# Patient Record
Sex: Female | Born: 1980 | Race: Black or African American | Hispanic: No | Marital: Single | Smoking: Former smoker
Health system: Southern US, Community
[De-identification: ages and names within clinical notes are randomized; demographics above are authoritative.]

## PROBLEM LIST (undated history)

## (undated) DIAGNOSIS — I10 Essential (primary) hypertension: Secondary | ICD-10-CM

## (undated) DIAGNOSIS — Z98891 History of uterine scar from previous surgery: Secondary | ICD-10-CM

## (undated) HISTORY — DX: History of uterine scar from previous surgery: Z98.891

---

## 2014-01-20 DIAGNOSIS — J4520 Mild intermittent asthma, uncomplicated: Secondary | ICD-10-CM | POA: Insufficient documentation

## 2017-06-10 DIAGNOSIS — M79602 Pain in left arm: Secondary | ICD-10-CM | POA: Diagnosis not present

## 2017-06-10 DIAGNOSIS — M791 Myalgia, unspecified site: Secondary | ICD-10-CM | POA: Diagnosis not present

## 2017-10-09 DIAGNOSIS — Z309 Encounter for contraceptive management, unspecified: Secondary | ICD-10-CM

## 2017-10-09 DIAGNOSIS — R030 Elevated blood-pressure reading, without diagnosis of hypertension: Secondary | ICD-10-CM | POA: Diagnosis not present

## 2017-10-09 DIAGNOSIS — F411 Generalized anxiety disorder: Secondary | ICD-10-CM | POA: Diagnosis not present

## 2017-10-09 DIAGNOSIS — Z0000 Encounter for general adult medical examination without abnormal findings: Secondary | ICD-10-CM | POA: Diagnosis not present

## 2017-10-09 DIAGNOSIS — Z13220 Encounter for screening for lipoid disorders: Secondary | ICD-10-CM | POA: Diagnosis not present

## 2017-10-09 DIAGNOSIS — Z113 Encounter for screening for infections with a predominantly sexual mode of transmission: Secondary | ICD-10-CM | POA: Diagnosis not present

## 2017-10-09 DIAGNOSIS — Z131 Encounter for screening for diabetes mellitus: Secondary | ICD-10-CM | POA: Diagnosis not present

## 2017-10-09 HISTORY — DX: Encounter for contraceptive management, unspecified: Z30.9

## 2017-10-10 DIAGNOSIS — Z309 Encounter for contraceptive management, unspecified: Secondary | ICD-10-CM | POA: Diagnosis not present

## 2017-11-25 DIAGNOSIS — H5213 Myopia, bilateral: Secondary | ICD-10-CM | POA: Diagnosis not present

## 2017-12-03 DIAGNOSIS — H5213 Myopia, bilateral: Secondary | ICD-10-CM | POA: Diagnosis not present

## 2017-12-16 DIAGNOSIS — H52223 Regular astigmatism, bilateral: Secondary | ICD-10-CM | POA: Diagnosis not present

## 2017-12-25 DIAGNOSIS — B349 Viral infection, unspecified: Secondary | ICD-10-CM | POA: Diagnosis not present

## 2017-12-29 DIAGNOSIS — Z309 Encounter for contraceptive management, unspecified: Secondary | ICD-10-CM | POA: Diagnosis not present

## 2017-12-29 DIAGNOSIS — Z23 Encounter for immunization: Secondary | ICD-10-CM | POA: Diagnosis not present

## 2018-01-19 DIAGNOSIS — I671 Cerebral aneurysm, nonruptured: Secondary | ICD-10-CM | POA: Diagnosis not present

## 2018-01-19 DIAGNOSIS — Z0389 Encounter for observation for other suspected diseases and conditions ruled out: Secondary | ICD-10-CM | POA: Diagnosis not present

## 2018-01-19 DIAGNOSIS — R200 Anesthesia of skin: Secondary | ICD-10-CM | POA: Diagnosis not present

## 2018-01-19 DIAGNOSIS — R55 Syncope and collapse: Secondary | ICD-10-CM | POA: Diagnosis not present

## 2018-01-19 DIAGNOSIS — I10 Essential (primary) hypertension: Secondary | ICD-10-CM | POA: Diagnosis not present

## 2018-01-19 DIAGNOSIS — M25512 Pain in left shoulder: Secondary | ICD-10-CM | POA: Diagnosis not present

## 2018-01-19 DIAGNOSIS — R531 Weakness: Secondary | ICD-10-CM | POA: Diagnosis not present

## 2018-01-20 DIAGNOSIS — I671 Cerebral aneurysm, nonruptured: Secondary | ICD-10-CM | POA: Diagnosis not present

## 2018-01-22 DIAGNOSIS — Z09 Encounter for follow-up examination after completed treatment for conditions other than malignant neoplasm: Secondary | ICD-10-CM | POA: Diagnosis not present

## 2018-01-22 DIAGNOSIS — I10 Essential (primary) hypertension: Secondary | ICD-10-CM | POA: Diagnosis not present

## 2018-01-22 DIAGNOSIS — I671 Cerebral aneurysm, nonruptured: Secondary | ICD-10-CM | POA: Diagnosis not present

## 2018-01-22 DIAGNOSIS — G43909 Migraine, unspecified, not intractable, without status migrainosus: Secondary | ICD-10-CM | POA: Diagnosis not present

## 2018-02-05 DIAGNOSIS — G43909 Migraine, unspecified, not intractable, without status migrainosus: Secondary | ICD-10-CM | POA: Diagnosis not present

## 2018-02-05 DIAGNOSIS — I10 Essential (primary) hypertension: Secondary | ICD-10-CM | POA: Diagnosis not present

## 2018-02-11 DIAGNOSIS — I671 Cerebral aneurysm, nonruptured: Secondary | ICD-10-CM | POA: Diagnosis not present

## 2018-03-31 DIAGNOSIS — Z309 Encounter for contraceptive management, unspecified: Secondary | ICD-10-CM | POA: Diagnosis not present

## 2018-05-15 DIAGNOSIS — E669 Obesity, unspecified: Secondary | ICD-10-CM | POA: Diagnosis not present

## 2018-05-15 DIAGNOSIS — I10 Essential (primary) hypertension: Secondary | ICD-10-CM | POA: Diagnosis not present

## 2018-06-19 DIAGNOSIS — Z309 Encounter for contraceptive management, unspecified: Secondary | ICD-10-CM | POA: Diagnosis not present

## 2018-08-12 DIAGNOSIS — I671 Cerebral aneurysm, nonruptured: Secondary | ICD-10-CM | POA: Diagnosis not present

## 2018-08-12 DIAGNOSIS — Z6831 Body mass index (BMI) 31.0-31.9, adult: Secondary | ICD-10-CM | POA: Diagnosis not present

## 2018-10-30 DIAGNOSIS — K5900 Constipation, unspecified: Secondary | ICD-10-CM | POA: Diagnosis not present

## 2018-10-30 DIAGNOSIS — G43009 Migraine without aura, not intractable, without status migrainosus: Secondary | ICD-10-CM | POA: Diagnosis not present

## 2018-10-30 DIAGNOSIS — I10 Essential (primary) hypertension: Secondary | ICD-10-CM | POA: Diagnosis not present

## 2018-10-30 DIAGNOSIS — Z13220 Encounter for screening for lipoid disorders: Secondary | ICD-10-CM | POA: Diagnosis not present

## 2018-10-30 DIAGNOSIS — E669 Obesity, unspecified: Secondary | ICD-10-CM | POA: Diagnosis not present

## 2018-11-14 DIAGNOSIS — Z03818 Encounter for observation for suspected exposure to other biological agents ruled out: Secondary | ICD-10-CM | POA: Diagnosis not present

## 2018-12-28 DIAGNOSIS — Z30019 Encounter for initial prescription of contraceptives, unspecified: Secondary | ICD-10-CM | POA: Diagnosis not present

## 2018-12-28 DIAGNOSIS — Z113 Encounter for screening for infections with a predominantly sexual mode of transmission: Secondary | ICD-10-CM | POA: Diagnosis not present

## 2018-12-28 DIAGNOSIS — Z0000 Encounter for general adult medical examination without abnormal findings: Secondary | ICD-10-CM | POA: Diagnosis not present

## 2018-12-28 DIAGNOSIS — Z124 Encounter for screening for malignant neoplasm of cervix: Secondary | ICD-10-CM | POA: Diagnosis not present

## 2019-02-16 DIAGNOSIS — I729 Aneurysm of unspecified site: Secondary | ICD-10-CM | POA: Diagnosis not present

## 2019-02-16 DIAGNOSIS — G43009 Migraine without aura, not intractable, without status migrainosus: Secondary | ICD-10-CM | POA: Diagnosis not present

## 2019-02-16 DIAGNOSIS — G43001 Migraine without aura, not intractable, with status migrainosus: Secondary | ICD-10-CM | POA: Diagnosis not present

## 2019-02-16 DIAGNOSIS — I10 Essential (primary) hypertension: Secondary | ICD-10-CM | POA: Diagnosis not present

## 2019-03-04 DIAGNOSIS — Z20828 Contact with and (suspected) exposure to other viral communicable diseases: Secondary | ICD-10-CM | POA: Diagnosis not present

## 2019-04-01 DIAGNOSIS — G43909 Migraine, unspecified, not intractable, without status migrainosus: Secondary | ICD-10-CM | POA: Diagnosis not present

## 2019-04-01 DIAGNOSIS — R519 Headache, unspecified: Secondary | ICD-10-CM | POA: Diagnosis not present

## 2019-04-01 DIAGNOSIS — Z87891 Personal history of nicotine dependence: Secondary | ICD-10-CM | POA: Diagnosis not present

## 2019-04-09 DIAGNOSIS — G43001 Migraine without aura, not intractable, with status migrainosus: Secondary | ICD-10-CM | POA: Insufficient documentation

## 2019-04-09 DIAGNOSIS — I671 Cerebral aneurysm, nonruptured: Secondary | ICD-10-CM | POA: Insufficient documentation

## 2019-04-09 DIAGNOSIS — Z87898 Personal history of other specified conditions: Secondary | ICD-10-CM | POA: Insufficient documentation

## 2019-04-09 DIAGNOSIS — Z87891 Personal history of nicotine dependence: Secondary | ICD-10-CM | POA: Diagnosis not present

## 2019-04-09 DIAGNOSIS — R7303 Prediabetes: Secondary | ICD-10-CM | POA: Diagnosis not present

## 2019-04-09 DIAGNOSIS — G43009 Migraine without aura, not intractable, without status migrainosus: Secondary | ICD-10-CM | POA: Diagnosis not present

## 2019-04-09 DIAGNOSIS — Z8679 Personal history of other diseases of the circulatory system: Secondary | ICD-10-CM | POA: Diagnosis not present

## 2019-04-24 DIAGNOSIS — I671 Cerebral aneurysm, nonruptured: Secondary | ICD-10-CM | POA: Diagnosis not present

## 2019-04-24 DIAGNOSIS — I729 Aneurysm of unspecified site: Secondary | ICD-10-CM | POA: Diagnosis not present

## 2019-05-15 DIAGNOSIS — I773 Arterial fibromuscular dysplasia: Secondary | ICD-10-CM | POA: Diagnosis not present

## 2019-05-21 DIAGNOSIS — I671 Cerebral aneurysm, nonruptured: Secondary | ICD-10-CM | POA: Diagnosis not present

## 2019-08-20 DIAGNOSIS — Z3042 Encounter for surveillance of injectable contraceptive: Secondary | ICD-10-CM | POA: Diagnosis not present

## 2019-08-20 DIAGNOSIS — Z23 Encounter for immunization: Secondary | ICD-10-CM | POA: Diagnosis not present

## 2019-09-10 DIAGNOSIS — I773 Arterial fibromuscular dysplasia: Secondary | ICD-10-CM | POA: Diagnosis not present

## 2019-09-10 DIAGNOSIS — Z8679 Personal history of other diseases of the circulatory system: Secondary | ICD-10-CM | POA: Diagnosis not present

## 2019-09-29 DIAGNOSIS — Z5329 Procedure and treatment not carried out because of patient's decision for other reasons: Secondary | ICD-10-CM | POA: Diagnosis not present

## 2019-09-29 DIAGNOSIS — R202 Paresthesia of skin: Secondary | ICD-10-CM | POA: Diagnosis not present

## 2019-09-29 DIAGNOSIS — R519 Headache, unspecified: Secondary | ICD-10-CM | POA: Diagnosis not present

## 2019-10-31 DIAGNOSIS — Z87891 Personal history of nicotine dependence: Secondary | ICD-10-CM | POA: Diagnosis not present

## 2019-10-31 DIAGNOSIS — R05 Cough: Secondary | ICD-10-CM | POA: Diagnosis not present

## 2019-10-31 DIAGNOSIS — R519 Headache, unspecified: Secondary | ICD-10-CM | POA: Diagnosis not present

## 2019-10-31 DIAGNOSIS — Z20822 Contact with and (suspected) exposure to covid-19: Secondary | ICD-10-CM | POA: Diagnosis not present

## 2019-11-03 DIAGNOSIS — Z20828 Contact with and (suspected) exposure to other viral communicable diseases: Secondary | ICD-10-CM | POA: Diagnosis not present

## 2019-11-05 DIAGNOSIS — Z3042 Encounter for surveillance of injectable contraceptive: Secondary | ICD-10-CM | POA: Diagnosis not present

## 2020-01-17 DIAGNOSIS — H5213 Myopia, bilateral: Secondary | ICD-10-CM | POA: Diagnosis not present

## 2020-01-25 DIAGNOSIS — Z3042 Encounter for surveillance of injectable contraceptive: Secondary | ICD-10-CM | POA: Diagnosis not present

## 2020-03-08 DIAGNOSIS — Z20828 Contact with and (suspected) exposure to other viral communicable diseases: Secondary | ICD-10-CM | POA: Diagnosis not present

## 2020-03-08 DIAGNOSIS — Z1152 Encounter for screening for COVID-19: Secondary | ICD-10-CM | POA: Diagnosis not present

## 2020-03-09 DIAGNOSIS — Z20828 Contact with and (suspected) exposure to other viral communicable diseases: Secondary | ICD-10-CM | POA: Diagnosis not present

## 2020-04-19 DIAGNOSIS — Z3009 Encounter for other general counseling and advice on contraception: Secondary | ICD-10-CM | POA: Diagnosis not present

## 2020-05-01 DIAGNOSIS — I10 Essential (primary) hypertension: Secondary | ICD-10-CM | POA: Diagnosis not present

## 2020-05-01 DIAGNOSIS — N898 Other specified noninflammatory disorders of vagina: Secondary | ICD-10-CM | POA: Diagnosis not present

## 2020-05-01 DIAGNOSIS — Z3042 Encounter for surveillance of injectable contraceptive: Secondary | ICD-10-CM | POA: Diagnosis not present

## 2020-05-01 DIAGNOSIS — Z113 Encounter for screening for infections with a predominantly sexual mode of transmission: Secondary | ICD-10-CM | POA: Diagnosis not present

## 2020-05-01 DIAGNOSIS — L509 Urticaria, unspecified: Secondary | ICD-10-CM | POA: Diagnosis not present

## 2020-05-24 DIAGNOSIS — R3129 Other microscopic hematuria: Secondary | ICD-10-CM | POA: Insufficient documentation

## 2020-05-24 DIAGNOSIS — E6609 Other obesity due to excess calories: Secondary | ICD-10-CM | POA: Insufficient documentation

## 2020-05-24 DIAGNOSIS — A749 Chlamydial infection, unspecified: Secondary | ICD-10-CM | POA: Diagnosis not present

## 2020-05-24 DIAGNOSIS — M791 Myalgia, unspecified site: Secondary | ICD-10-CM | POA: Diagnosis not present

## 2020-05-24 DIAGNOSIS — Z87891 Personal history of nicotine dependence: Secondary | ICD-10-CM | POA: Diagnosis not present

## 2020-05-24 DIAGNOSIS — Z6835 Body mass index (BMI) 35.0-35.9, adult: Secondary | ICD-10-CM | POA: Diagnosis not present

## 2020-05-24 DIAGNOSIS — Z8679 Personal history of other diseases of the circulatory system: Secondary | ICD-10-CM | POA: Diagnosis not present

## 2020-05-24 DIAGNOSIS — J4520 Mild intermittent asthma, uncomplicated: Secondary | ICD-10-CM | POA: Diagnosis not present

## 2020-05-24 DIAGNOSIS — Z87898 Personal history of other specified conditions: Secondary | ICD-10-CM | POA: Diagnosis not present

## 2020-05-24 DIAGNOSIS — Z113 Encounter for screening for infections with a predominantly sexual mode of transmission: Secondary | ICD-10-CM | POA: Diagnosis not present

## 2020-05-24 DIAGNOSIS — H6123 Impacted cerumen, bilateral: Secondary | ICD-10-CM | POA: Diagnosis not present

## 2020-05-24 HISTORY — DX: Other microscopic hematuria: R31.29

## 2020-06-19 DIAGNOSIS — R3129 Other microscopic hematuria: Secondary | ICD-10-CM | POA: Diagnosis not present

## 2020-06-29 DIAGNOSIS — I671 Cerebral aneurysm, nonruptured: Secondary | ICD-10-CM | POA: Diagnosis not present

## 2020-07-04 DIAGNOSIS — I773 Arterial fibromuscular dysplasia: Secondary | ICD-10-CM | POA: Diagnosis not present

## 2020-07-04 DIAGNOSIS — R42 Dizziness and giddiness: Secondary | ICD-10-CM | POA: Diagnosis not present

## 2020-07-04 DIAGNOSIS — I10 Essential (primary) hypertension: Secondary | ICD-10-CM | POA: Diagnosis not present

## 2020-07-04 DIAGNOSIS — J45909 Unspecified asthma, uncomplicated: Secondary | ICD-10-CM | POA: Diagnosis not present

## 2020-07-04 DIAGNOSIS — R200 Anesthesia of skin: Secondary | ICD-10-CM | POA: Diagnosis not present

## 2020-07-04 DIAGNOSIS — R4189 Other symptoms and signs involving cognitive functions and awareness: Secondary | ICD-10-CM | POA: Diagnosis not present

## 2020-07-04 DIAGNOSIS — M542 Cervicalgia: Secondary | ICD-10-CM | POA: Diagnosis not present

## 2020-07-04 DIAGNOSIS — R4589 Other symptoms and signs involving emotional state: Secondary | ICD-10-CM | POA: Diagnosis not present

## 2020-07-04 DIAGNOSIS — Z87891 Personal history of nicotine dependence: Secondary | ICD-10-CM | POA: Diagnosis not present

## 2020-07-04 DIAGNOSIS — E785 Hyperlipidemia, unspecified: Secondary | ICD-10-CM | POA: Diagnosis not present

## 2020-07-04 DIAGNOSIS — I671 Cerebral aneurysm, nonruptured: Secondary | ICD-10-CM | POA: Diagnosis not present

## 2020-07-04 DIAGNOSIS — G43009 Migraine without aura, not intractable, without status migrainosus: Secondary | ICD-10-CM | POA: Diagnosis not present

## 2020-07-04 DIAGNOSIS — G479 Sleep disorder, unspecified: Secondary | ICD-10-CM | POA: Diagnosis not present

## 2020-07-10 DIAGNOSIS — Z3042 Encounter for surveillance of injectable contraceptive: Secondary | ICD-10-CM | POA: Diagnosis not present

## 2020-07-10 DIAGNOSIS — R3129 Other microscopic hematuria: Secondary | ICD-10-CM | POA: Diagnosis not present

## 2020-07-21 DIAGNOSIS — E559 Vitamin D deficiency, unspecified: Secondary | ICD-10-CM | POA: Insufficient documentation

## 2020-07-21 DIAGNOSIS — E782 Mixed hyperlipidemia: Secondary | ICD-10-CM | POA: Insufficient documentation

## 2020-07-21 HISTORY — DX: Vitamin D deficiency, unspecified: E55.9

## 2020-08-05 DIAGNOSIS — R200 Anesthesia of skin: Secondary | ICD-10-CM | POA: Diagnosis not present

## 2020-08-05 DIAGNOSIS — M47812 Spondylosis without myelopathy or radiculopathy, cervical region: Secondary | ICD-10-CM | POA: Diagnosis not present

## 2020-08-05 DIAGNOSIS — M542 Cervicalgia: Secondary | ICD-10-CM | POA: Diagnosis not present

## 2020-08-05 DIAGNOSIS — I773 Arterial fibromuscular dysplasia: Secondary | ICD-10-CM | POA: Diagnosis not present

## 2020-08-05 DIAGNOSIS — R202 Paresthesia of skin: Secondary | ICD-10-CM | POA: Diagnosis not present

## 2020-09-14 DIAGNOSIS — I773 Arterial fibromuscular dysplasia: Secondary | ICD-10-CM | POA: Insufficient documentation

## 2020-09-14 DIAGNOSIS — E782 Mixed hyperlipidemia: Secondary | ICD-10-CM | POA: Diagnosis not present

## 2020-09-14 DIAGNOSIS — E559 Vitamin D deficiency, unspecified: Secondary | ICD-10-CM | POA: Diagnosis not present

## 2020-09-14 DIAGNOSIS — J4520 Mild intermittent asthma, uncomplicated: Secondary | ICD-10-CM | POA: Diagnosis not present

## 2020-09-14 DIAGNOSIS — G43001 Migraine without aura, not intractable, with status migrainosus: Secondary | ICD-10-CM | POA: Diagnosis not present

## 2020-09-14 DIAGNOSIS — Z3042 Encounter for surveillance of injectable contraceptive: Secondary | ICD-10-CM | POA: Diagnosis not present

## 2020-09-14 DIAGNOSIS — E6609 Other obesity due to excess calories: Secondary | ICD-10-CM | POA: Diagnosis not present

## 2020-09-14 DIAGNOSIS — I671 Cerebral aneurysm, nonruptured: Secondary | ICD-10-CM | POA: Diagnosis not present

## 2020-09-14 DIAGNOSIS — I10 Essential (primary) hypertension: Secondary | ICD-10-CM | POA: Diagnosis not present

## 2020-09-14 DIAGNOSIS — Z6834 Body mass index (BMI) 34.0-34.9, adult: Secondary | ICD-10-CM | POA: Diagnosis not present

## 2020-10-09 DIAGNOSIS — Z3042 Encounter for surveillance of injectable contraceptive: Secondary | ICD-10-CM | POA: Diagnosis not present

## 2020-10-18 DIAGNOSIS — G4720 Circadian rhythm sleep disorder, unspecified type: Secondary | ICD-10-CM | POA: Diagnosis not present

## 2020-10-18 DIAGNOSIS — R0683 Snoring: Secondary | ICD-10-CM | POA: Diagnosis not present

## 2020-10-18 DIAGNOSIS — F518 Other sleep disorders not due to a substance or known physiological condition: Secondary | ICD-10-CM | POA: Diagnosis not present

## 2020-10-18 DIAGNOSIS — G4750 Parasomnia, unspecified: Secondary | ICD-10-CM | POA: Diagnosis not present

## 2020-10-19 DIAGNOSIS — M5030 Other cervical disc degeneration, unspecified cervical region: Secondary | ICD-10-CM | POA: Diagnosis not present

## 2020-10-19 DIAGNOSIS — M47812 Spondylosis without myelopathy or radiculopathy, cervical region: Secondary | ICD-10-CM | POA: Diagnosis not present

## 2020-10-19 DIAGNOSIS — M40202 Unspecified kyphosis, cervical region: Secondary | ICD-10-CM | POA: Diagnosis not present

## 2020-10-30 ENCOUNTER — Ambulatory Visit: Payer: Self-pay | Admitting: Physician Assistant

## 2020-11-15 DIAGNOSIS — F518 Other sleep disorders not due to a substance or known physiological condition: Secondary | ICD-10-CM | POA: Diagnosis not present

## 2020-11-15 DIAGNOSIS — G4720 Circadian rhythm sleep disorder, unspecified type: Secondary | ICD-10-CM | POA: Diagnosis not present

## 2020-11-15 DIAGNOSIS — G4750 Parasomnia, unspecified: Secondary | ICD-10-CM | POA: Diagnosis not present

## 2020-12-12 DIAGNOSIS — G4733 Obstructive sleep apnea (adult) (pediatric): Secondary | ICD-10-CM | POA: Diagnosis not present

## 2020-12-13 DIAGNOSIS — G8929 Other chronic pain: Secondary | ICD-10-CM | POA: Diagnosis not present

## 2020-12-13 DIAGNOSIS — M47812 Spondylosis without myelopathy or radiculopathy, cervical region: Secondary | ICD-10-CM | POA: Diagnosis not present

## 2020-12-13 DIAGNOSIS — M7918 Myalgia, other site: Secondary | ICD-10-CM | POA: Diagnosis not present

## 2020-12-21 DIAGNOSIS — G4733 Obstructive sleep apnea (adult) (pediatric): Secondary | ICD-10-CM | POA: Insufficient documentation

## 2020-12-22 ENCOUNTER — Other Ambulatory Visit (HOSPITAL_COMMUNITY): Payer: Self-pay

## 2020-12-22 ENCOUNTER — Encounter: Payer: Self-pay | Admitting: Family Medicine

## 2020-12-22 ENCOUNTER — Other Ambulatory Visit: Payer: Self-pay

## 2020-12-22 ENCOUNTER — Ambulatory Visit: Payer: Medicaid Other | Admitting: Family Medicine

## 2020-12-22 VITALS — BP 135/88 | HR 90 | Ht 60.0 in | Wt 181.0 lb

## 2020-12-22 DIAGNOSIS — R4589 Other symptoms and signs involving emotional state: Secondary | ICD-10-CM

## 2020-12-22 DIAGNOSIS — R140 Abdominal distension (gaseous): Secondary | ICD-10-CM

## 2020-12-22 DIAGNOSIS — I671 Cerebral aneurysm, nonruptured: Secondary | ICD-10-CM

## 2020-12-22 DIAGNOSIS — U071 COVID-19: Secondary | ICD-10-CM | POA: Insufficient documentation

## 2020-12-22 DIAGNOSIS — Z1231 Encounter for screening mammogram for malignant neoplasm of breast: Secondary | ICD-10-CM

## 2020-12-22 DIAGNOSIS — R040 Epistaxis: Secondary | ICD-10-CM | POA: Insufficient documentation

## 2020-12-22 DIAGNOSIS — G4733 Obstructive sleep apnea (adult) (pediatric): Secondary | ICD-10-CM | POA: Diagnosis not present

## 2020-12-22 DIAGNOSIS — F418 Other specified anxiety disorders: Secondary | ICD-10-CM

## 2020-12-22 DIAGNOSIS — K5900 Constipation, unspecified: Secondary | ICD-10-CM

## 2020-12-22 DIAGNOSIS — Z0000 Encounter for general adult medical examination without abnormal findings: Secondary | ICD-10-CM | POA: Diagnosis present

## 2020-12-22 DIAGNOSIS — Z23 Encounter for immunization: Secondary | ICD-10-CM

## 2020-12-22 DIAGNOSIS — G4700 Insomnia, unspecified: Secondary | ICD-10-CM

## 2020-12-22 DIAGNOSIS — R3129 Other microscopic hematuria: Secondary | ICD-10-CM | POA: Diagnosis not present

## 2020-12-22 DIAGNOSIS — I10 Essential (primary) hypertension: Secondary | ICD-10-CM

## 2020-12-22 DIAGNOSIS — Z3042 Encounter for surveillance of injectable contraceptive: Secondary | ICD-10-CM

## 2020-12-22 DIAGNOSIS — Z1211 Encounter for screening for malignant neoplasm of colon: Secondary | ICD-10-CM

## 2020-12-22 DIAGNOSIS — E6609 Other obesity due to excess calories: Secondary | ICD-10-CM | POA: Insufficient documentation

## 2020-12-22 DIAGNOSIS — Z6836 Body mass index (BMI) 36.0-36.9, adult: Secondary | ICD-10-CM | POA: Insufficient documentation

## 2020-12-22 HISTORY — DX: Epistaxis: R04.0

## 2020-12-22 HISTORY — DX: Encounter for general adult medical examination without abnormal findings: Z00.00

## 2020-12-22 HISTORY — DX: COVID-19: U07.1

## 2020-12-22 HISTORY — DX: Abdominal distension (gaseous): R14.0

## 2020-12-22 MED ORDER — VERAPAMIL HCL ER 120 MG PO CP24: 120.0000 mg | ORAL_CAPSULE | Freq: Every day | ORAL | 3 refills | Status: DC

## 2020-12-22 MED ORDER — LISINOPRIL 10 MG PO TABS: 10.0000 mg | ORAL_TABLET | Freq: Every day | ORAL | 3 refills | Status: DC

## 2020-12-22 MED ORDER — DOCUSATE SODIUM 250 MG PO CAPS: 250.0000 mg | ORAL_CAPSULE | Freq: Every day | ORAL | 0 refills | Status: DC

## 2020-12-22 MED ORDER — NORTRIPTYLINE HCL 25 MG PO CAPS: 25.0000 mg | ORAL_CAPSULE | Freq: Every day | ORAL | 3 refills | Status: DC

## 2020-12-22 NOTE — Progress Notes (Signed)
    SUBJECTIVE:   CHIEF COMPLAINT / HPI:   New patient here to establish care - review PMHx and medications - patient has several concerns, addressed below  Abdominal bloating and urinary frequency - intermittent bloating - sister had similar presentation plus urinary frequency and ended up being diagnosed with some kind of tumor at the age of 40 yo, had to get tumor resection and chemo for it - patient cannot recall what kind of cancer or what abdominal organ it affected - no known other family history of abdominal malignancy, etc - no dysuria - some suprapubic pain - never been to GI doctor before - she is quite concerned, given her sister's presentation and diagnosis  Contraception - next due for Depo injection at end of month - RN Melvenia Beam has made appointment   PERTINENT  PMH / PSH: COVID in June, brain aneurysm followed by [facility], HTN, cervical spondylosis, OSA  OBJECTIVE:   BP 135/88   Pulse 90   Ht 5' (1.524 m)   Wt 181 lb (82.1 kg)   SpO2 100%   BMI 35.35 kg/m    PHQ-9: No flowsheet data found.   GAD-7: No flowsheet data found.  Physical Exam General: Awake, alert, oriented HEENT: PERRL, bilateral TM pearly pink and flat, bilateral external auditory canals with minimal cerumen burden, no lesions, nasal mucosa slightly edematous, oral mucosa pink, moist, without lesion, intact dentition Cardiovascular: Regular rate and rhythm, S1 and S2 present, no murmurs auscultated Respiratory: Lung fields clear to auscultation bilaterally Abdomen: Soft, nondistended, moderate TTP in LLQ, mild TTP in suprapubic area, no rebound tenderness or guarding Neuro: Cranial nerves II through X grossly intact, equal smile and cheek puff, uvula midline, extraocular movements intact, BLE strength 5/5 and equal bilaterally, BLE strength 5/5 and equal bilaterally  ASSESSMENT/PLAN:   Hypertension in setting of cerebral aneurysm without rupture Sees [facility] for this. Surveillance only, no  procedures or operations. BP today 135/88. Patient requests refills of BP meds. No changes at this time.   Contraception management Currently using Depo-provera. Will be due for next injection in 1-2 weeks. Made a nurse-only appointment for one week to obtain this.   Abdominal bloating Patient reports intermittent abdominal bloating, low suprapubic pain, and discomfort. No history of intestinal diagnoses that she knows of. Chart review reveals microscopic hematuria earlier in 2022 with referral to urology 07/2020. She reports her sister had similar presentation at 73 yo and was diagnosed with abdominal cancer of some sort that required resection and chemo. Unfortunately, patient cannot recall what kind of cancer or what abdominal organ it affected. Will send to GI. Will also refer to urology, as she does not appear to have made the connection May 2022.   Healthcare maintenance Ordered mammogram.  Received flu and TDaP vaccines, tolerated well.  Due for PAP, needs to schedule at her convenience.      Fayette Pho, MD [facility]

## 2020-12-22 NOTE — Assessment & Plan Note (Signed)
Patient reports anxiety worse at night. Says she is scared to go to sleep because she is afraid she won't wake up. Has not tried therapy before to work through this anxiety. Given other complaints addressed today, will work on this at follow up in one month.

## 2020-12-22 NOTE — Assessment & Plan Note (Signed)
Recently had sleep study in [facility] system. She has future appointment in one week to review results. No CPAP at this time.

## 2020-12-22 NOTE — Assessment & Plan Note (Addendum)
Ordered mammogram.  Received flu and TDaP vaccines, tolerated well.  Due for PAP, needs to schedule at her convenience.

## 2020-12-22 NOTE — Assessment & Plan Note (Signed)
Sees [facility] for this. Surveillance only, no procedures or operations.

## 2020-12-22 NOTE — Assessment & Plan Note (Signed)
Currently using Depo-provera. Will be due for next injection in 1-2 weeks. Made a nurse-only appointment for one week to obtain this.

## 2020-12-22 NOTE — Patient Instructions (Signed)
It was wonderful to meet you today. Thank you for allowing me to be a part of your care. Below is a short summary of what we discussed at your visit today:  High blood pressure Today we sent refills of your BP meds to the pharmacy. Please make an appointment to follow up in 3 weeks.   Mammogram I sent an order for you to get your mammogram. [facility] will go to the [facility]. Call them directly for an appointment.   Abdominal bloating and pain I have referred you to a gastroenterology office. They may do a colonoscopy. Their office should call you directly to schedule your appointment.   Vaccines Today you received the annual flu vaccine and the Tdap vaccine.  You may experience some residual soreness at the injection site.  Gentle stretches and regular use of that arm will help speed up your recovery.  As the vaccines are giving your immune system a "practice run" against specific infections, you may feel a little under the weather for the next several days.  We recommend rest as needed and hydrating.  Depo for birth control Come back in one week for your injection appointment.    Please bring all of your medications to every appointment!  If you have any questions or concerns, please do not hesitate to contact us via phone or MyChart message.   Fayette Pho, MD

## 2020-12-22 NOTE — Assessment & Plan Note (Signed)
Still reports anosmia and dysgeusia, wonders if this is normal this far out from infection. Also reports she thinks she occasionally smells something burning when her son cannot smell it. Discussed with patient. No neuro deficits, physical exam unremarkable - see neuro exam. Discussed long term sequela of COVID infection.

## 2020-12-22 NOTE — Assessment & Plan Note (Signed)
BP today 135/88. Patient requests refills of BP meds. No changes at this time.

## 2020-12-22 NOTE — Assessment & Plan Note (Addendum)
Patient reports intermittent abdominal bloating, low suprapubic pain, and discomfort. No history of intestinal diagnoses that she knows of. Chart review reveals microscopic hematuria earlier in 2022 with referral to urology 07/2020. She reports her sister had similar presentation at 40 yo and was diagnosed with abdominal cancer of some sort that required resection and chemo. Unfortunately, patient cannot recall what kind of cancer or what abdominal organ it affected. Will send to GI. Will also refer to urology, as she does not appear to have made the connection May 2022.

## 2020-12-27 DIAGNOSIS — G4710 Hypersomnia, unspecified: Secondary | ICD-10-CM | POA: Diagnosis not present

## 2020-12-28 ENCOUNTER — Ambulatory Visit (INDEPENDENT_AMBULATORY_CARE_PROVIDER_SITE_OTHER): Payer: Medicaid Other

## 2020-12-28 ENCOUNTER — Other Ambulatory Visit: Payer: Self-pay

## 2020-12-28 DIAGNOSIS — Z3042 Encounter for surveillance of injectable contraceptive: Secondary | ICD-10-CM

## 2021-01-01 MED ORDER — MEDROXYPROGESTERONE ACETATE 150 MG/ML IM SUSP
150.0000 mg | Freq: Once | INTRAMUSCULAR | Status: AC
  Administered 2020-12-28: 150 mg via INTRAMUSCULAR

## 2021-01-01 NOTE — Progress Notes (Signed)
Patient here today for Depo Provera injection and is within her dates. Patient was last given depo injection at [facility]. Verified that patient is within her dates via Epic.   Last contraceptive appt was 12/22/2020  Depo given in [place] today.  Site unremarkable & patient tolerated injection.    Next injection due 03/15/21 -03/29/21.  Reminder card given.    Veronda Prude, RN

## 2021-01-02 DIAGNOSIS — M542 Cervicalgia: Secondary | ICD-10-CM | POA: Diagnosis not present

## 2021-01-09 DIAGNOSIS — R519 Headache, unspecified: Secondary | ICD-10-CM | POA: Diagnosis not present

## 2021-01-09 DIAGNOSIS — E785 Hyperlipidemia, unspecified: Secondary | ICD-10-CM | POA: Diagnosis not present

## 2021-01-10 ENCOUNTER — Telehealth: Payer: Self-pay

## 2021-01-10 DIAGNOSIS — I10 Essential (primary) hypertension: Secondary | ICD-10-CM

## 2021-01-10 NOTE — Telephone Encounter (Signed)
Received fax from pharmacy that Verapamil ER capsules are not covered by insurance.   Per formulary, Verapamil tablets/ ER tablets are covered by insurance. Please advise if medication can be changed from capsule to tablet. If so, please send in new rx to patient's pharmacy.   Key- [id number]  Veronda Prude, RN

## 2021-01-11 MED ORDER — VERAPAMIL HCL ER 120 MG PO TBCR: 120.0000 mg | EXTENDED_RELEASE_TABLET | Freq: Every day | ORAL | 2 refills | Status: DC

## 2021-01-11 NOTE — Telephone Encounter (Signed)
Switch from capsule to tablet is acceptable. I have ordered the tablet version, sent to her pharmacy.   Fayette Pho, MD

## 2021-01-15 ENCOUNTER — Ambulatory Visit (INDEPENDENT_AMBULATORY_CARE_PROVIDER_SITE_OTHER): Payer: Medicaid Other | Admitting: Family Medicine

## 2021-01-15 ENCOUNTER — Encounter: Payer: Self-pay | Admitting: Family Medicine

## 2021-01-15 ENCOUNTER — Other Ambulatory Visit: Payer: Self-pay

## 2021-01-15 VITALS — BP 136/65 | HR 99 | Ht 60.0 in | Wt 185.8 lb

## 2021-01-15 DIAGNOSIS — I671 Cerebral aneurysm, nonruptured: Secondary | ICD-10-CM

## 2021-01-15 DIAGNOSIS — R519 Headache, unspecified: Secondary | ICD-10-CM

## 2021-01-15 DIAGNOSIS — R531 Weakness: Secondary | ICD-10-CM | POA: Insufficient documentation

## 2021-01-15 DIAGNOSIS — G8929 Other chronic pain: Secondary | ICD-10-CM

## 2021-01-15 DIAGNOSIS — I10 Essential (primary) hypertension: Secondary | ICD-10-CM | POA: Diagnosis not present

## 2021-01-15 DIAGNOSIS — R45850 Homicidal ideations: Secondary | ICD-10-CM

## 2021-01-15 DIAGNOSIS — F17200 Nicotine dependence, unspecified, uncomplicated: Secondary | ICD-10-CM | POA: Insufficient documentation

## 2021-01-15 NOTE — Patient Instructions (Addendum)
Thank commended today.  I am sorry to hear that she continued to have a headache.  Your blood pressure looks great today but I am concerned about your headache as well as your left-sided numbness and weakness.  I suggest that you go to the ED immediately following this visit for further work-up.  Dr. Salvadore Dom  Psychiatry Resource List (Adults and Children) Most of these providers will take Medicaid. please consult your insurance for a complete and updated list of available providers. When calling to make an appointment have your insurance information available to confirm you are covered.   [facility]:Psychiatry and Counseling [address] [place], [place] [address] [phone]  [facility]  [address] [place], [place] [phone] Crisis [phone]   [facility]:   [facility]: [address]     [phone]   [facility]: [address] [place]. [place],        [phone] [place]: [address] [phone],    [phone] 5 [place]: [phone] [address],                   [phone] Children: [facility] [address]         [phone]  [facility] (virtual only) [phone]   [facility]  (Psychiatry only; Adults /children 12 and over, will take Medicaid)  [address] Laurell Josephs [address], [place], [place] [address]       [phone]   [facility] (Psychiatry & counseling ; adults & children ; will take Medicaid [address]  [place] [address]  Go on-line to complete referral ( [email or web address]://[email or web address]/en/make-[email or web address]-referral 972-516-2012    (Spanish speaking therapists)  [facility]  Psychiatry & counseling; Adults and children;  Call Registration prior to scheduling an appointment [phone] [address]    [place], [place] [address]    [phone]  [facility] (Psychiatry & counseling; adults & children; Medicare no Medicaid)  [address]   [place], [place]  [address]      [phone]    [facility] (up to age 39)  Psychiatry & counseling ,will take Medicaid, must do counseling to receive psychiatry services  [address] [place] [address]        [phone]  [facility] (Psychiatry & counseling; adults & children; will take Medicaid) Will need a referral from provider [address],  [place], [place]  [phone]   [facility] --- Walk-In Mon-Friday 8am-3pm ( will take Medicaid, Psychiatry, Adults & children,  [address], [place], [place]   [phone]   [facility], Walk-in M-F 8am-12pm and 1pm -3pm   (Counseling, Psychiatry, will take Medicaid, adults & children)  [address], [place], [place]  [phone]

## 2021-01-15 NOTE — Progress Notes (Signed)
SUBJECTIVE:   CHIEF COMPLAINT / HPI:   [name] is a 40 yo F who presents for follow up.   Headache Continues to have headache. Has a history of chronic headaches. Endorsing light sensitivity, noise and smell sensitivity. Has associated nausea and left sided numbness and weakness. States she had a recent fall at work due to weakness. She acknowledges she was recently seen by the neurologist and was going to get her head scanned but did not for unknown reasons.   Hypertension - Medications: Lisinopril 10 mg, verapamil 120 mg - Compliance: Yes - Denies any SOB, CP, vision changes, LE edema, medication SEs, or symptoms of hypotension  Endorsing HI- "I just cant do this anymore", "this is going to kill me". She has daily thoughts of wanting to hurt or kill family members. She does not have a plan and does not know how she would do it. Would like to talk to someone about this.   PERTINENT  PMH / PSH: Cerebral aneurysm without rupture, Anxiety surrounding dying  OBJECTIVE:   BP 136/65   Pulse 99   Ht 5' (1.524 m)   Wt 185 lb 12.8 oz (84.3 kg)   SpO2 99%   BMI 36.29 kg/m   Physical Exam Vitals and nursing note reviewed.  Constitutional:      General: She is not in acute distress.    Appearance: She is not ill-appearing, toxic-appearing or diaphoretic.  HENT:     Head: Normocephalic.  Eyes:     Pupils: Pupils are equal, round, and reactive to light.  Cardiovascular:     Rate and Rhythm: Normal rate and regular rhythm.     Heart sounds: Normal heart sounds.  Pulmonary:     Effort: Pulmonary effort is normal.     Breath sounds: Normal breath sounds.  Abdominal:     General: Bowel sounds are normal.     Palpations: Abdomen is soft.  Neurological:     Mental Status: She is alert and oriented to person, place, and time.     Cranial Nerves: No cranial nerve deficit, dysarthria or facial asymmetry.     Sensory: Sensory deficit present.     Motor: Weakness present.     Gait:  Gait normal.     Comments: Left UE abnormal sensation reported by patient. Left LLE 3/5 strength, RLE 4/5 strength  Psychiatric:        Mood and Affect: Mood is anxious.        Speech: Speech normal.        Behavior: Behavior normal.    ASSESSMENT/PLAN:   Cerebral aneurysm without rupture  Chronic intractable headache Neurology visit 11/8 reviewed. Last CT head and neck with 48mm aneurysm off of M1 of right MCA present. Neurology suggested ED visit during their telemedicine visit due to new findings reported by patient. Patient refused. Neurology ordered the below test and patient never went to get them done for unknown reasons. Today discussed with patient concern for potentially new intracranial findings or worsening of those prior. Ordered tests. Needed prior authorization and our coordinator will be in this afternoon. Suggested patient go to ED instead. She stated she would. Plan to follow up peripherally.  - CT HEAD WO CONTRAST ( ); Future - CT ANGIO NECK CODE STROKE; Future - CT VENOGRAM HEAD; Future  HTN BP at goal. Continue current medications.   Homicidal thoughts Mentioned at the very end of visit. When this provider gave AVS and opened the door to leave  exam room patient erupted in tears and stated the above. These are passive thoughts. Due to urgency and concern of new headache features discussed briefly and given resources for therapy and encouraged to start as soon as possible. Could benefit from PCP follow up beyond evaluation of the above.    Christine Jumbo, DO [facility]

## 2021-01-22 ENCOUNTER — Telehealth: Payer: Self-pay

## 2021-01-22 NOTE — Telephone Encounter (Signed)
Received phone call from [facility] regarding updating order for imaging on 11/23.   Order needs to be updated to CT angio head and neck and CT head w/o contrast needs to be canceled.   Spoke with Dr. McDiarmid regarding patient. Gave verbal authorization to update orders.   Veronda Prude, RN

## 2021-01-24 ENCOUNTER — Other Ambulatory Visit: Payer: Self-pay

## 2021-01-24 ENCOUNTER — Ambulatory Visit (HOSPITAL_COMMUNITY)
Admission: RE | Admit: 2021-01-24 | Discharge: 2021-01-24 | Disposition: A | Discharge status: Home / Self Care | Payer: Medicaid Other | Source: Ambulatory Visit | Attending: Family Medicine | Admitting: Family Medicine

## 2021-01-24 ENCOUNTER — Ambulatory Visit (HOSPITAL_COMMUNITY): Payer: Medicaid Other

## 2021-01-24 DIAGNOSIS — I671 Cerebral aneurysm, nonruptured: Secondary | ICD-10-CM | POA: Diagnosis present

## 2021-01-24 DIAGNOSIS — G8929 Other chronic pain: Secondary | ICD-10-CM | POA: Diagnosis present

## 2021-01-24 DIAGNOSIS — R519 Headache, unspecified: Secondary | ICD-10-CM | POA: Diagnosis present

## 2021-01-24 DIAGNOSIS — H539 Unspecified visual disturbance: Secondary | ICD-10-CM | POA: Diagnosis not present

## 2021-01-24 DIAGNOSIS — R55 Syncope and collapse: Secondary | ICD-10-CM | POA: Diagnosis not present

## 2021-01-24 LAB — POCT I-STAT CREATININE: Creatinine, Ser: 0.9 mg/dL (ref 0.44–1.00)

## 2021-01-24 MED ORDER — IOHEXOL 350 MG/ML SOLN
80.0000 mL | Freq: Once | INTRAVENOUS | Status: AC | PRN
  Administered 2021-01-24: 80 mL via INTRAVENOUS

## 2021-01-24 MED ORDER — SODIUM CHLORIDE (PF) 0.9 % IJ SOLN
INTRAMUSCULAR | Status: AC
  Filled 2021-01-24: qty 50

## 2021-01-27 ENCOUNTER — Encounter: Payer: Self-pay | Admitting: Family Medicine

## 2021-01-30 ENCOUNTER — Other Ambulatory Visit: Payer: Self-pay

## 2021-01-30 ENCOUNTER — Ambulatory Visit
Admission: RE | Admit: 2021-01-30 | Discharge: 2021-01-30 | Disposition: A | Discharge status: Home / Self Care | Payer: Medicaid Other | Source: Ambulatory Visit | Attending: Family Medicine | Admitting: Family Medicine

## 2021-01-30 DIAGNOSIS — Z1231 Encounter for screening mammogram for malignant neoplasm of breast: Secondary | ICD-10-CM

## 2021-02-01 ENCOUNTER — Telehealth: Payer: Self-pay | Admitting: Family Medicine

## 2021-02-01 NOTE — Telephone Encounter (Signed)
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 She is doing better and has been in contact with her neurologist. I will attempt to send results to neurologist as well. Encouraged continued follow up as deem necessary by neurologist.   [name], DO 02/01/2021, 8:07 AM PGY-3, [facility]

## 2021-02-02 ENCOUNTER — Other Ambulatory Visit: Payer: Self-pay | Admitting: Family Medicine

## 2021-02-02 DIAGNOSIS — R928 Other abnormal and inconclusive findings on diagnostic imaging of breast: Secondary | ICD-10-CM

## 2021-02-21 ENCOUNTER — Encounter: Payer: Self-pay | Admitting: Neurology

## 2021-03-08 ENCOUNTER — Ambulatory Visit: Payer: Medicaid Other

## 2021-03-08 ENCOUNTER — Ambulatory Visit
Admission: RE | Admit: 2021-03-08 | Discharge: 2021-03-08 | Disposition: A | Discharge status: Home / Self Care | Payer: Medicaid Other | Source: Ambulatory Visit | Attending: Family Medicine | Admitting: Family Medicine

## 2021-03-08 DIAGNOSIS — R928 Other abnormal and inconclusive findings on diagnostic imaging of breast: Secondary | ICD-10-CM

## 2021-03-08 DIAGNOSIS — R922 Inconclusive mammogram: Secondary | ICD-10-CM | POA: Diagnosis not present

## 2021-03-15 ENCOUNTER — Ambulatory Visit: Payer: Medicaid Other

## 2021-03-16 ENCOUNTER — Emergency Department (HOSPITAL_COMMUNITY): Payer: Medicaid Other

## 2021-03-16 ENCOUNTER — Ambulatory Visit (INDEPENDENT_AMBULATORY_CARE_PROVIDER_SITE_OTHER): Payer: Medicaid Other

## 2021-03-16 ENCOUNTER — Encounter (HOSPITAL_COMMUNITY): Payer: Self-pay

## 2021-03-16 ENCOUNTER — Emergency Department (HOSPITAL_COMMUNITY)
Admission: EM | Admit: 2021-03-16 | Discharge: 2021-03-16 | Disposition: A | Discharge status: Home / Self Care | Payer: Medicaid Other | Attending: Emergency Medicine | Admitting: Emergency Medicine

## 2021-03-16 ENCOUNTER — Other Ambulatory Visit: Payer: Self-pay

## 2021-03-16 DIAGNOSIS — Z79899 Other long term (current) drug therapy: Secondary | ICD-10-CM | POA: Diagnosis not present

## 2021-03-16 DIAGNOSIS — Z3049 Encounter for surveillance of other contraceptives: Secondary | ICD-10-CM | POA: Diagnosis present

## 2021-03-16 DIAGNOSIS — I10 Essential (primary) hypertension: Secondary | ICD-10-CM | POA: Diagnosis not present

## 2021-03-16 DIAGNOSIS — W010XXA Fall on same level from slipping, tripping and stumbling without subsequent striking against object, initial encounter: Secondary | ICD-10-CM | POA: Insufficient documentation

## 2021-03-16 DIAGNOSIS — M5412 Radiculopathy, cervical region: Secondary | ICD-10-CM | POA: Insufficient documentation

## 2021-03-16 DIAGNOSIS — Z7982 Long term (current) use of aspirin: Secondary | ICD-10-CM | POA: Diagnosis not present

## 2021-03-16 DIAGNOSIS — M25561 Pain in right knee: Secondary | ICD-10-CM | POA: Diagnosis present

## 2021-03-16 DIAGNOSIS — W19XXXA Unspecified fall, initial encounter: Secondary | ICD-10-CM

## 2021-03-16 DIAGNOSIS — R079 Chest pain, unspecified: Secondary | ICD-10-CM | POA: Diagnosis not present

## 2021-03-16 DIAGNOSIS — R0789 Other chest pain: Secondary | ICD-10-CM | POA: Diagnosis not present

## 2021-03-16 HISTORY — DX: Essential (primary) hypertension: I10

## 2021-03-16 LAB — BASIC METABOLIC PANEL
Anion gap: 8 (ref 5–15)
BUN: 13 mg/dL (ref 6–20)
CO2: 22 mmol/L (ref 22–32)
Calcium: 9.1 mg/dL (ref 8.9–10.3)
Chloride: 106 mmol/L (ref 98–111)
Creatinine, Ser: 0.74 mg/dL (ref 0.44–1.00)
GFR, Estimated: >60 mL/min (ref >60)
Glucose, Bld: 96 mg/dL (ref 70–99)
Potassium: 3.6 mmol/L (ref 3.5–5.1)
Sodium: 136 mmol/L (ref 135–145)

## 2021-03-16 LAB — CBC
HCT: 42.2 % (ref 36.0–46.0)
Hemoglobin: 13.8 g/dL (ref 12.0–15.0)
MCH: 30.7 pg (ref 26.0–34.0)
MCHC: 32.7 g/dL (ref 30.0–36.0)
MCV: 93.8 fL (ref 80.0–100.0)
Platelets: 289 10*3/uL (ref 150–400)
RBC: 4.5 MIL/uL (ref 3.87–5.11)
RDW: 12.6 % (ref 11.5–15.5)
WBC: 5.4 10*3/uL (ref 4.0–10.5)
nRBC: 0 % (ref 0.0–0.2)

## 2021-03-16 LAB — TROPONIN I (HIGH SENSITIVITY): Troponin I (High Sensitivity): <2 ng/L (ref <18)

## 2021-03-16 MED ORDER — KETOROLAC TROMETHAMINE 15 MG/ML IJ SOLN
15.0000 mg | Freq: Once | INTRAMUSCULAR | Status: AC
Start: 2021-03-16 — End: 2021-03-16
  Administered 2021-03-16: 15 mg via INTRAMUSCULAR
  Filled 2021-03-16: qty 1

## 2021-03-16 MED ORDER — MEDROXYPROGESTERONE ACETATE 150 MG/ML IM SUSP
150.0000 mg | Freq: Once | INTRAMUSCULAR | Status: AC
  Administered 2021-03-16: 150 mg via INTRAMUSCULAR

## 2021-03-16 MED ORDER — NAPROXEN 375 MG PO TABS: 375.0000 mg | ORAL_TABLET | Freq: Two times a day (BID) | ORAL | 0 refills | Status: DC

## 2021-03-16 MED ORDER — PREDNISONE 10 MG (21) PO TBPK: ORAL_TABLET | Freq: Every day | ORAL | 0 refills | Status: DC

## 2021-03-16 NOTE — Progress Notes (Signed)
Orthopedic Tech Progress Note Patient Details:  Christine Gordon 09-09-1980 [id number]  Ortho Devices Type of Ortho Device: Knee Sleeve Ortho Device/Splint Location: RLE Ortho Device/Splint Interventions: Application   Post Interventions Patient Tolerated: Well  Bora Broner E Peni Rupard 03/16/2021, 2:00 PM

## 2021-03-16 NOTE — Progress Notes (Signed)
Patient here today for Depo Provera injection and is within her dates.    Last contraceptive appt was 12/22/2020.   Depo given in [place] today. Site unremarkable & patient tolerated injection.     Next injection due 06/01/2021-06/15/2021. Reminder card given.

## 2021-03-16 NOTE — ED Provider Triage Note (Signed)
Emergency Medicine Provider Triage Evaluation Note  Eulamae Greenstein , a 41 y.o. female  was evaluated in triage.  Pt complains of R knee pain, L arm pain and weakness, cp.  Review of Systems  Positive: Cp, L arm pain and weakness, R knee injury Negative: Fever, sob, numbness  Physical Exam  BP (!) 147/107 (BP Location: Left Arm)    Pulse (!) 106    Temp 98.7 F (37.1 C) (Oral)    Resp 18    SpO2 99%  Gen:   Awake, no distress   Resp:  Normal effort  MSK:   Moves extremities without difficulty  Other:    Medical Decision Making  Medically screening exam initiated at 9:29 AM.  Appropriate orders placed.  Chablis Losh was informed that the remainder of the evaluation will be completed by another provider, this initial triage assessment does not replace that evaluation, and the importance of remaining in the ED until their evaluation is complete.  Pt report she fell 2 days ago when her R knee gave out. Endorse pain about R knee.  Also report intermittent midsternal chest discomfort for the past 2-3 weeks.  Report L arm pain and weakness for the past several months.     Fayrene Helper, PA-C 03/16/21 [phone]

## 2021-03-16 NOTE — ED Provider Notes (Addendum)
[facility]-EMERGENCY DEPT Provider Note   CSN: [id number] Arrival date & time: 03/16/21  6144     History  No chief complaint on file.   Christine Gordon is a 41 y.o. female.  HPI  41 year old female with a history of cervical spondylosis with cervical radiculopathy, hypertension, fibromuscular dysplasia, prediabetes, cerebral aneurysm without rupture, who presents to the emergency department today for evaluation of multiple complaints.  Patient's main concern is that she had a fall yesterday.  She states that her knee gave out on her and she felt a popping sound.  She has had right knee pain since then.  She has been able to ambulate but does get sore when she has to use it for an extended period of time.  There are no other reported injuries including no report of head injury or LOC.  Patient is not anticoagulated.  Additionally the patient is concerned about left upper extremity pain.  She states that this is been ongoing for over a year and is associated with neck pain, tingling and a feeling of heaviness in the left upper extremity.  She has been seen by multiple voc titers about this in the past and actually was following with neurosurgery at [facility] about it.  She states that no one can tell her what is wrong with her and she is looking for some answers today.  She denies any specific changes with her symptoms today  The triage note indicates that the patient is complaining of chest pain however she made no mention of this throughout the entirety of my evaluation of her.   Home Medications Prior to Admission medications   Medication Sig Start Date End Date Taking? Authorizing Provider  naproxen (NAPROSYN) 375 MG tablet Take 1 tablet (375 mg total) by mouth 2 (two) times daily. 03/16/21  Yes Marsha Hillman S, PA-C  predniSONE (STERAPRED UNI-PAK 21 TAB) 10 MG (21) TBPK tablet Take by mouth daily. Take 6 tabs by mouth daily  for 2 days, then 5 tabs for 2 days, then 4  tabs for 2 days, then 3 tabs for 2 days, 2 tabs for 2 days, then 1 tab by mouth daily for 2 days 03/16/21  Yes Winn Muehl S, PA-C  albuterol (VENTOLIN HFA) 108 (90 Base) MCG/ACT inhaler Inhale into the lungs every 6 (six) hours as needed.    [provider]  aspirin EC 81 MG tablet Take 81 mg by mouth daily. Swallow whole.    [provider]  Cholecalciferol 125 MCG (5000 UT) TABS Take by mouth. 09/14/20   [provider]  cyclobenzaprine (FLEXERIL) 5 MG tablet Take 5 mg by mouth 3 (three) times daily as needed.    [provider]  docusate sodium (COLACE) 250 MG capsule Take 1 capsule (250 mg total) by mouth daily. 12/22/20   Fayette Pho, MD  lisinopril (ZESTRIL) 10 MG tablet Take 1 tablet (10 mg total) by mouth at bedtime. 12/22/20   Fayette Pho, MD  medroxyPROGESTERone Acetate 150 MG/ML SUSY Inject into the muscle. 10/09/20 01/01/22  [provider]  meloxicam (MOBIC) 15 MG tablet Take 15 mg by mouth daily as needed.    [provider]  nortriptyline (PAMELOR) 25 MG capsule Take 1 capsule (25 mg total) by mouth at bedtime. 12/22/20   Fayette Pho, MD  rosuvastatin (CRESTOR) 10 MG tablet Take 10 mg by mouth daily.    [provider]  senna (SENOKOT) 8.6 MG TABS tablet Take 1 tablet by mouth.  [provider]  Spacer/Aero-Holding Chambers (EASIVENT) inhaler Use as instructed with inhaler. 01/14/14   [provider]  verapamil (CALAN-SR) 120 MG CR tablet Take 1 tablet (120 mg total) by mouth at bedtime. 01/11/21   Fayette Pho, MD      Allergies    Patient has no known allergies.    Review of Systems   Review of Systems  Musculoskeletal:  Positive for neck pain.       Left arm pain, right knee pain  Skin:  Negative for wound.  Neurological:  Positive for weakness and numbness (paresthesias).   Physical Exam Updated Vital Signs BP (!) 152/100    Pulse 79    Temp 98.7 F (37.1 C) (Oral)     Resp 18    Ht 5' (1.524 m)    Wt 81.6 kg    SpO2 100%    BMI 35.15 kg/m  Physical Exam Vitals and nursing note reviewed.  Constitutional:      General: She is not in acute distress.    Appearance: She is well-developed.  HENT:     Head: Normocephalic and atraumatic.  Eyes:     Conjunctiva/sclera: Conjunctivae normal.  Cardiovascular:     Rate and Rhythm: Normal rate and regular rhythm.  Pulmonary:     Effort: Pulmonary effort is normal.  Abdominal:     General: Abdomen is flat.  Musculoskeletal:     Cervical back: Neck supple.     Comments: TTP to the cervical spine, left trapezius muscles (chronic). TTP over the right patella without obvious deformity. Strength intact with varus/valgus stress and with posterior/anterior drawer testing   Skin:    General: Skin is warm and dry.  Neurological:     Mental Status: She is alert.     Comments: Slightly decreased strength to the LUE compared to the right. Exam is also limited by pain. (Pt does report this is chronic). Sensation is intact bilaterally .    ED Results / Procedures / Treatments   Labs (all labs ordered are listed, but only abnormal results are displayed) Labs Reviewed  BASIC METABOLIC PANEL  CBC  TROPONIN I (HIGH SENSITIVITY)  TROPONIN I (HIGH SENSITIVITY)    EKG EKG Interpretation  Date/Time:  Friday March 16 2021 10:04:51 EST Ventricular Rate:  98 PR Interval:  151 QRS Duration: 73 QT Interval:  330 QTC Calculation: 422 R Axis:   33 Text Interpretation: Sinus rhythm Confirmed by Alvester Chou [phone]) on 03/16/2021 11:00:12 AM  Radiology DG Chest 1 View  Result Date: 03/16/2021 CLINICAL DATA:  Intermittent mid chest pain EXAM: CHEST  1 VIEW COMPARISON:  None. FINDINGS: Normal heart size and mediastinal contours. No acute infiltrate or edema. No effusion or pneumothorax. No acute osseous findings. Scoliosis of the thoracic and lumbar spine IMPRESSION: No acute finding. Thoracolumbar scoliosis.  Electronically Signed   By: Tiburcio Pea M.D.   On: 03/16/2021 10:13   DG Knee Complete 4 Views Right  Result Date: 03/16/2021 CLINICAL DATA:  Right knee pain after fall 2 days ago. EXAM: RIGHT KNEE - COMPLETE 4+ VIEW COMPARISON:  None. FINDINGS: No evidence of fracture, dislocation, or joint effusion. No evidence of arthropathy or other focal bone abnormality. Soft tissues are unremarkable. IMPRESSION: Negative. Electronically Signed   By: Lupita Raider M.D.   On: 03/16/2021 10:09    Procedures Procedures   SPLINT APPLICATION Date/Time: 1:37 PM Authorized by: Karrie Meres Consent: Verbal consent obtained. Risks and benefits: risks, benefits and alternatives  were discussed Consent given by: patient Splint applied by: orthopedic technician Location details: right knee Splint type: knee brace Supplies used: hinged knee brace Post-procedure: The splinted body part was neurovascularly unchanged following the procedure. Patient tolerance: Patient tolerated the procedure well with no immediate complications.    Medications Ordered in ED Medications  ketorolac (TORADOL) 15 MG/ML injection 15 mg (has no administration in time range)    ED Course/ Medical Decision Making/ A&P                           Medical Decision Making  41 year old female presenting the emergency department today for evaluation of right knee pain that occurred after a mechanical fall yesterday.  There were no other reported injuries from the fall.  She is not anticoagulated.  She does have some tenderness over the patella without any obvious deformity.  No joint laxity to suggest ligamentous injury.  X-ray was negative for any fracture.  This was personally reviewed and interpreted and I agree with the report.  She was placed in a hinged knee brace for comfort.  I will also give her follow-up with orthopedics for this.  Additionally she is complaining of neck pain and left upper extremity pain.  She is a  well-documented history of cervical spondylosis with cervical radiculopathy in her chart and previously followed with neurosurgery.  Her symptoms today seem chronic in nature and are unchanged from prior.  I do not see any reason to proceed with any further investigation of her complaint given she does not have any new neurologic deficits or any red flag symptoms today that would warrant immediate work-up.  She did just moved to the area and is requesting referral to a neurosurgeon in the area so this was given to her.  I will start her on prednisone and anti-inflammatories to help with her current symptoms.  She is also advised to follow-up with her PCP to get a referral for physical therapy for her cervical radiculopathy.  Additionally there was mention of chest pain in the triage note however patient made no mention of this throughout the entirety of my evaluation, her cardiac work-up was benign and I have extremely low suspicion for ACS or or other acute cardiopulmonary pathology today that would warrant further work-up or investigation.  I have advised to return to the ED for any new or worsening symptoms in the meantime and she voices understanding the plan and reasons to return.  All questions answered.  Patient stable for discharge.         Final Clinical Impression(s) / ED Diagnoses Final diagnoses:  Fall, initial encounter  Acute pain of right knee  Cervical radiculopathy    Rx / DC Orders ED Discharge Orders          Ordered    predniSONE (STERAPRED UNI-PAK 21 TAB) 10 MG (21) TBPK tablet  Daily        03/16/21 1326    naproxen (NAPROSYN) 375 MG tablet  2 times daily        03/16/21 [address]              Maxden Naji S, PA-C 03/16/21 1337    Karrie MeresCouture, Zainah Steven S, PA-C 03/16/21 1341    Terald Sleeperrifan, Matthew J, MD 03/16/21 1554

## 2021-03-16 NOTE — Discharge Instructions (Addendum)
You may alternate taking Tylenol and Naproxen as needed for pain control. You may take Naproxen twice daily as directed on your discharge paperwork and you may take  [phone] mg of Tylenol every 6 hours. Do not exceed 4000 mg of Tylenol daily as this can lead to liver damage. Also, make sure to take Naproxen with meals as it can cause an upset stomach. Do not take other NSAIDs while taking Naproxen such as (Aleve, Ibuprofen, Aspirin, Celebrex, etc) and do not take more than the prescribed dose as this can lead to ulcers and bleeding in your GI tract. You may use warm and cold compresses to help with your symptoms.   Take prednisone as directed  Please follow up with the orthopedic doctor and the back doctor within the next 7-10 days for re-evaluation and further treatment of your symptoms.   Please return to the ER sooner if you have any new or worsening symptoms.

## 2021-03-16 NOTE — ED Triage Notes (Signed)
Patient reports that 2 nights ago she stretched and heard her right knee pop x 2.  Patient also c/o left arm numbness and weakness x 2 years. Patient states that she can only hold her left arm up for a short time.

## 2021-03-17 ENCOUNTER — Telehealth: Payer: Self-pay

## 2021-03-17 NOTE — Telephone Encounter (Signed)
Transition Care Management Follow-up Telephone Call Date of discharge and from where: 03/16/2021-[facility]  How have you been since you were released from the hospital? Patient stated she is doing fine after discharge.  Any questions or concerns? No  Items Reviewed: Did the pt receive and understand the discharge instructions provided? Yes  Medications obtained and verified? Yes  Other? No  Any new allergies since your discharge? No  Dietary orders reviewed? No Do you have support at home? Yes   Home Care and Equipment/Supplies: Were home health services ordered? not applicable If so, what is the name of the agency? N/A  Has the agency set up a time to come to the patient's home? not applicable Were any new equipment or medical supplies ordered?  No What is the name of the medical supply agency? N/A Were you able to get the supplies/equipment? not applicable Do you have any questions related to the use of the equipment or supplies? No  Functional Questionnaire: (I = Independent and D = Dependent) ADLs: I  Bathing/Dressing- I  Meal Prep- I  Eating- I  Maintaining continence- I  Transferring/Ambulation- I  Managing Meds- I  Follow up appointments reviewed:  PCP Hospital f/u appt confirmed? No   Specialist Hospital f/u appt confirmed? No   Are transportation arrangements needed? No  If their condition worsens, is the pt aware to call PCP or go to the Emergency Dept.? Yes Was the patient provided with contact information for the PCP's office or ED? Yes Was to pt encouraged to call back with questions or concerns? Yes

## 2021-04-04 ENCOUNTER — Other Ambulatory Visit: Payer: Self-pay

## 2021-04-04 ENCOUNTER — Encounter: Payer: Self-pay | Admitting: Family Medicine

## 2021-04-04 ENCOUNTER — Ambulatory Visit (INDEPENDENT_AMBULATORY_CARE_PROVIDER_SITE_OTHER): Payer: Medicaid Other | Admitting: Family Medicine

## 2021-04-04 VITALS — BP 139/99 | HR 88 | Ht 60.0 in | Wt 190.2 lb

## 2021-04-04 DIAGNOSIS — M7918 Myalgia, other site: Secondary | ICD-10-CM | POA: Diagnosis not present

## 2021-04-04 DIAGNOSIS — M25561 Pain in right knee: Secondary | ICD-10-CM | POA: Diagnosis present

## 2021-04-04 DIAGNOSIS — M47812 Spondylosis without myelopathy or radiculopathy, cervical region: Secondary | ICD-10-CM | POA: Diagnosis not present

## 2021-04-04 DIAGNOSIS — G8929 Other chronic pain: Secondary | ICD-10-CM | POA: Diagnosis not present

## 2021-04-04 DIAGNOSIS — M25569 Pain in unspecified knee: Secondary | ICD-10-CM | POA: Insufficient documentation

## 2021-04-04 HISTORY — DX: Pain in unspecified knee: M25.569

## 2021-04-04 MED ORDER — CAPSAICIN-MENTHOL-METHYL SAL 0.001-10-20 % EX LOTN: TOPICAL_LOTION | CUTANEOUS | 1 refills | Status: DC

## 2021-04-04 NOTE — Assessment & Plan Note (Signed)
Patient request referral for neurosurgery closer to home in [place].  Order placed today.  Also referred to physical therapy for this and chronic myofascial pain.

## 2021-04-04 NOTE — Assessment & Plan Note (Signed)
Acute.  Experienced a popping sensation followed by the knee "giving out".  Physical exam today is unremarkable.  Patient desires connection with orthopedics, placed referral to orthopedics and physical therapy.  Contraindication to NSAIDs given brain aneurysm, will opt for topicals.  Rx capsaicin cream.

## 2021-04-04 NOTE — Progress Notes (Signed)
SUBJECTIVE:   CHIEF COMPLAINT / HPI:   ED follow up for knee pain and neck + left arm pain  Chart review, ED 03/16/21, provider notes below "41 year old female presenting the emergency department today for evaluation of right knee pain that occurred after a mechanical fall yesterday.  There were no other reported injuries from the fall.  She is not anticoagulated.  She does have some tenderness over the patella without any obvious deformity.  No joint laxity to suggest ligamentous injury.  X-ray was negative for any fracture.  This was personally reviewed and interpreted and I agree with the report.  She was placed in a hinged knee brace for comfort.  I will also give her follow-up with orthopedics for this.  Additionally she is complaining of neck pain and left upper extremity pain.  She is a well-documented history of cervical spondylosis with cervical radiculopathy in her chart and previously followed with neurosurgery.  Her symptoms today seem chronic in nature and are unchanged from prior.  I do not see any reason to proceed with any further investigation of her complaint given she does not have any new neurologic deficits or any red flag symptoms today that would warrant immediate work-up.  She did just moved to the area and is requesting referral to a neurosurgeon in the area so this was given to her.  I will start her on prednisone and anti-inflammatories to help with her current symptoms.  She is also advised to follow-up with her PCP to get a referral for physical therapy for her cervical radiculopathy.  Additionally there was mention of chest pain in the triage note however patient made no mention of this throughout the entirety of my evaluation, her cardiac work-up was benign and I have extremely low suspicion for ACS or or other acute cardiopulmonary pathology today that would warrant further work-up or investigation.  I have advised to return to the ED for any new or worsening symptoms in  the meantime and she voices understanding the plan and reasons to return.  All questions answered.  Patient stable for discharge."  Knee, fall - felt it "pop out", gave out on her before going to ED - brace provided in ED is helping - no falls since the ED - was referred to orthopedics by ED.  Patient tried to call the ortho office, wasn't able to schedule, would like referral today  Neck and arm pain - documented history of cervical spondylosis and cervical radiculopathy -ED provider did not find any red flag symptoms or acute complaints that vary from baseline - previously set up with PT in [place], the patient reports difficult to attend given distance - Patient requests neurosurgery referral to obtain neurosurgery provider closer to [facility], preferably in city limits  PERTINENT  PMH / PSH:  Patient Active Problem List   Diagnosis Date Noted   Knee pain 04/04/2021   Cervical spondylosis 04/04/2021   Chronic myofascial pain 04/04/2021   Chronic complaint of left-sided weakness 01/15/2021   Smoking    Hypertension 12/22/2020   Anxiety about dying 12/22/2020   Healthcare maintenance 12/22/2020   Class 2 obesity due to excess calories with body mass index (BMI) of 36.0 to 36.9 in adult    Obstructive sleep apnea 12/21/2020   Fibromuscular dysplasia ([place]) 09/14/2020   Mixed hyperlipidemia 07/21/2020   Microscopic hematuria 05/24/2020   Cerebral aneurysm without rupture 04/09/2019   History of prediabetes 04/09/2019   Migraine without aura and with status migrainosus, not  intractable 04/09/2019   Contraception management 10/09/2017   Mild intermittent asthma without complication [id number]    OBJECTIVE:   BP (!) 139/99    Pulse 88    Ht 5' (1.524 m)    Wt 190 lb 3.2 oz (86.3 kg)    SpO2 100%    BMI 37.15 kg/m    PHQ-9:  Depression screen [facility] 2/9 04/04/2021 01/15/2021  Decreased Interest 1 1  Down, Depressed, Hopeless 0 0  PHQ - 2 Score 1 1  Altered sleeping 0 1  Tired,  decreased energy 1 1  Change in appetite 0 0  Feeling bad or failure about yourself  0 0  Trouble concentrating 0 0  Moving slowly or fidgety/restless 0 0  Suicidal thoughts 0 0  PHQ-9 Score 2 3  Difficult doing work/chores - Somewhat difficult     GAD-7: No flowsheet data found.  Physical Exam General: Awake, alert, oriented Cardiovascular: Regular rate and rhythm, S1 and S2 present, no murmurs auscultated Respiratory: Lung fields clear to auscultation bilaterally MSK: Right knee with full range of motion, no patellar apprehension, negative anterior/posterior drawer, mild TTP along lateral joint line, stable joint  ASSESSMENT/PLAN:   Knee pain Acute.  Experienced a popping sensation followed by the knee "giving out".  Physical exam today is unremarkable.  Patient desires connection with orthopedics, placed referral to orthopedics and physical therapy.  Contraindication to NSAIDs given brain aneurysm, will opt for topicals.  Rx capsaicin cream.  Cervical spondylosis Patient request referral for neurosurgery closer to home in [place].  Order placed today.  Also referred to physical therapy for this and chronic myofascial pain.     Ezequiel Essex, MD [place]

## 2021-04-04 NOTE — Patient Instructions (Signed)
It was wonderful to see you today. Thank you for allowing me to be a part of your care. Below is a short summary of what we discussed at your visit today:  Knee pain - I have referred you to orthopedics since the ED referral fell through - I have sent a referral to physical therapy, you should be hearing from them soon to schedule an appointment -Is not safe for you to take NSAIDs like meloxicam because of the aneurysm -Instead, try the capsaicin cream on your knee daily for pain relief  Cervical spondylosis, cervical radiculopathy Per your request, I have sent a referral to connect you with a neurosurgeon here in town so it will be easier to get care. -You can also work with physical therapy for these problems    Please bring all of your medications to every appointment!  If you have any questions or concerns, please do not hesitate to contact us via phone or MyChart message.   Fayette Pho, MD

## 2021-04-08 ENCOUNTER — Other Ambulatory Visit: Payer: Self-pay | Admitting: Family Medicine

## 2021-04-08 DIAGNOSIS — G4700 Insomnia, unspecified: Secondary | ICD-10-CM

## 2021-04-08 DIAGNOSIS — R4589 Other symptoms and signs involving emotional state: Secondary | ICD-10-CM

## 2021-04-10 ENCOUNTER — Encounter: Payer: Self-pay | Admitting: Physician Assistant

## 2021-04-12 ENCOUNTER — Telehealth: Payer: Self-pay

## 2021-04-12 DIAGNOSIS — M25561 Pain in right knee: Secondary | ICD-10-CM

## 2021-04-12 NOTE — Telephone Encounter (Signed)
Patient calls nurse line regarding issues with picking up Capsaicin lotion. Called and spoke with pharmacist, this medication is OTC. Medicaid does not cover OTC medications.   Patient informed. Patient is requesting alternative medication that is not OTC, so she can obtain through her insurance.   Please advise.   Christine Prude, RN

## 2021-04-13 ENCOUNTER — Ambulatory Visit (HOSPITAL_BASED_OUTPATIENT_CLINIC_OR_DEPARTMENT_OTHER): Payer: Medicaid Other | Admitting: Orthopaedic Surgery

## 2021-04-13 ENCOUNTER — Other Ambulatory Visit: Payer: Self-pay

## 2021-04-13 DIAGNOSIS — M222X1 Patellofemoral disorders, right knee: Secondary | ICD-10-CM

## 2021-04-13 MED ORDER — LIDOCAINE 5 % EX OINT: 1.0000 "application " | TOPICAL_OINTMENT | CUTANEOUS | 0 refills | Status: DC | PRN

## 2021-04-13 NOTE — Progress Notes (Signed)
Chief Complaint: Right knee pain     History of Present Illness:    Christine Gordon is a 41 y.o. female presents with right knee pain for 2 weeks after she got out of bed and felt like the knee gave out.  She is having pain with prolonged standing.  She works at [employer] and is on her feet all day.  She was given an over-the-counter brace in the emergency room which helped her significantly.  She has been taking naproxen as well as was given prednisone which helped which she has not completed.  States that she is having some similar irritation in the left knee.  Both are located centrally behind the patella.    Surgical History:   None  PMH/PSH/Family History/Social History/Meds/Allergies:    Past Medical History:  Diagnosis Date   Abdominal bloating 12/22/2020   Bleeding nose 12/22/2020   COVID-19 12/22/2020   COVID infection June 2022.    H/O cesarean section    x3 perpatient   Hypertension    Vitamin D deficiency 07/21/2020   Past Surgical History:  Procedure Laterality Date   CESAREAN SECTION N/A    x3 per patient   Social History   Socioeconomic History   Marital status: Single    Spouse name: Not on file   Number of children: 4   Years of education: Not on file   Highest education level: Not on file  Occupational History   Not on file  Tobacco Use   Smoking status: Former    Types: Cigarettes    Quit date: 2019    Years since quitting: 4.1   Smokeless tobacco: Not on file  Vaping Use   Vaping Use: Never used  Substance and Sexual Activity   Alcohol use: Never   Drug use: Never   Sexual activity: Not on file  Other Topics Concern   Not on file  Social History Narrative   Christine Gordon has 4 children: daughter (b ~37) and 3 sons (b.~2021),(b.2003), and (b. ~2005).   2 oldest sons live in [place] and her youngest son lives with her   Social Determinants of Corporate investment banker Strain: Not on file  Food  Insecurity: Not on file  Transportation Needs: Not on file  Physical Activity: Not on file  Stress: Not on file  Social Connections: Not on file   Family History  Problem Relation Age of Onset   Healthy Mother    High Cholesterol Maternal Aunt    High blood pressure Maternal Aunt    No Known Allergies Current Outpatient Medications  Medication Sig Dispense Refill   albuterol (VENTOLIN HFA) 108 (90 Base) MCG/ACT inhaler Inhale into the lungs every 6 (six) hours as needed.     aspirin EC 81 MG tablet Take 81 mg by mouth daily. Swallow whole.     Capsaicin-Menthol-Methyl Sal 0.001-10-20 % LOTN Apply to knee daily for pain relief. 113.4 g 1   Cholecalciferol 125 MCG (5000 UT) TABS Take by mouth.     cyclobenzaprine (FLEXERIL) 5 MG tablet Take 5 mg by mouth 3 (three) times daily as needed.     docusate sodium (COLACE) 250 MG capsule Take 1 capsule (250 mg total) by mouth daily. 10 capsule 0   lidocaine (XYLOCAINE) 5 % ointment Apply 1 application topically  as needed. 35.44 g 0   lisinopril (ZESTRIL) 10 MG tablet Take 1 tablet (10 mg total) by mouth at bedtime. 90 tablet 3   medroxyPROGESTERone Acetate 150 MG/ML SUSY Inject into the muscle.     meloxicam (MOBIC) 15 MG tablet Take 15 mg by mouth daily as needed.     naproxen (NAPROSYN) 375 MG tablet Take 1 tablet (375 mg total) by mouth 2 (two) times daily. 20 tablet 0   nortriptyline (PAMELOR) 25 MG capsule TAKE 1 CAPSULE BY MOUTH AT BEDTIME. 90 capsule 2   rosuvastatin (CRESTOR) 10 MG tablet Take 10 mg by mouth daily.     senna (SENOKOT) 8.6 MG TABS tablet Take 1 tablet by mouth.     Spacer/Aero-Holding Chambers (EASIVENT) inhaler Use as instructed with inhaler.     verapamil (CALAN-SR) 120 MG CR tablet Take 1 tablet (120 mg total) by mouth at bedtime. 30 tablet 2   No current facility-administered medications for this visit.   No results found.  Review of Systems:   A ROS was performed including pertinent positives and negatives as  documented in the HPI.  Physical Exam :   Constitutional: NAD and appears stated age Neurological: Alert and oriented Psych: Appropriate affect and cooperative There were no vitals taken for this visit.   Comprehensive Musculoskeletal Exam:      Musculoskeletal Exam  Gait Normal  Alignment Normal   Right Left  Inspection Normal Normal  Palpation    Tenderness Patellofemoral None  Crepitus None None  Effusion None None  Range of Motion    Extension 0 0  Flexion 135 135  Strength    Extension 5/5 5/5  Flexion 5/5 5/5  Ligament Exam     Generalized Laxity No No  Lachman Negative Negative   Pivot Shift Negative Negative  Anterior Drawer Negative Negative  Valgus at 0 Negative Negative  Valgus at 20 Negative Negative  Varus at 0 0 0  Varus at 20   0 0  Posterior Drawer at 90 0 0  Vascular/Lymphatic Exam    Edema None None  Venous Stasis Changes No No  Distal Circulation Normal Normal  Neurologic    Light Touch Sensation Intact Intact  Special Tests:      Imaging:   Xray (4 views right knee): Normal   I personally reviewed and interpreted the radiographs.   Assessment:   41 year old female with right knee pain consistent with patellofemoral syndrome.  I have advised her to specifically consider an over-the-counter compression brace.  She will consider this.  I have also given her a series of hip and quadriceps instructions and exercises that she can perform at home in order to improve her pain.  I discussed that this is typically self-limiting and resolves with strengthening of the hip and knee.  Plan :    -She will see me back on an as-needed basis     I personally saw and evaluated the patient, and participated in the management and treatment plan.  Huel Cote, MD Attending Physician, Orthopedic Surgery  This document was dictated using Dragon voice recognition software. A reasonable attempt at proof reading has been made to minimize errors.

## 2021-04-13 NOTE — Telephone Encounter (Signed)
Attempted to search [place] Medicaid formulary, could not find capsaicin cream on list. Sent lidocaine cream instead.  Ezequiel Essex, MD

## 2021-04-20 NOTE — Progress Notes (Signed)
04/23/2021 Christine Gordon [id number] 12/01/1980   ASSESSMENT AND PLAN:   Christine Gordon was seen today for bloated and abdominal pain.  Diagnoses and all orders for this visit:  Chronic idiopathic constipation -     TSH; Future -Check thyroid, will check CT abdomen pelvis with contrast with lower abdominal pain, urinary frequency, hematuria. -Possible component of pelvic floor dysfunction. -Patient has been on Senokot, MiraLAX in the past without help, will give samples of 290 mcg Linzess for possible idiopathic constipation  Abdominal bloating -     CBC with Differential/Platelet; Future -     Comprehensive metabolic panel; Future -     IgA; Future -     Tissue transglutaminase, IgA; Future -Will check for celiac.  Will treat constipation/IBS with linzess samples Given FODMAP information and discussed diet.  If negative can consider SIBO testing for methane  Acute cystitis with hematuria -     CT ABDOMEN PELVIS W CONTRAST; Future -Patient's had hematuria in the past, lower abdominal discomfort, constipation, will get CT abdomen pelvis with contrast to rule out tumor, pelvic floor dysfunction, etc.  Lower abdominal pain -     CT ABDOMEN PELVIS W CONTRAST; Future  GERD -     famotidine (PEPCID) 40 MG tablet; Take 1 tablet (40 mg total) by mouth at bedtime. -Take NSAIDs sparingly we will treat with Pepcid lifestyle discussed    Future Appointments  Date Time Provider [place]  05/22/2021 11:10 AM Pieter Partridge, DO [facility] None    Patient Care Team: Ezequiel Essex, MD as PCP - General (Family Medicine) [facility] [place] as Nurse Practitioner (Family Medicine) [place], Elige Radon, MD as Consulting Physician (Neurology) Agapito Games, MD as Consulting Physician (Neurosurgery) Bayard Hugger, MD as Consulting Physician (Neurosurgery) Tyler Pita, MD as Consulting Physician (Physical Medicine and Rehabilitation)  HISTORY OF PRESENT  ILLNESS: 41 y.o. [place] female referred by Ezequiel Essex, MD, with a past medical history of hypertension, migraine, fibromuscular dysplasia sleep apnea class II obesity and others listed below presents for evaluation of bloating.   Has AB bloating for at least over 5 years. Has had constipation for years.  Has decreased fatty foods, eating baked foods/salads, trying to exercise and still has issues.  No foods worse.  Has some nausea but no vomiting, rare.  Denies GERD, dysphagia but has had some mid sternum chest pain, sharp, rare, worse with lying down, nonexertional. She is on mobic and naprosyn as needed She has BM every 2-3 days, can be loose or hard, Denies rectal pain, rectal bleeding.  On Senokot and Colace Can have suprapubic AB pain, can feel something hard. She has urinary frequency, sister with tumor in belly and similar symptoms.  Denies weight loss, has fluctuating weight up and down.  She has 4 kids, 2 natural and 2 C sections. She is on depo shot.   External labs and notes reviewed this visit: CBC  03/16/2021  HGB 13.8 MCV 93.8  WBC 5.4 Platelets 289 Kidney function 03/16/2021  BUN 13 Cr 0.74  GFR >60  Potassium 3.6   No recent liver function Blood in urine 2022, no menses due to depo.   Current Medications:   Current Outpatient Medications (Endocrine & Metabolic):    medroxyPROGESTERone Acetate 150 MG/ML SUSY, Inject into the muscle.  Current Outpatient Medications (Cardiovascular):    lisinopril (ZESTRIL) 10 MG tablet, Take 1 tablet (10 mg total) by mouth at bedtime.   rosuvastatin (CRESTOR) 10  MG tablet, Take 10 mg by mouth daily.   verapamil (CALAN-SR) 120 MG CR tablet, Take 1 tablet (120 mg total) by mouth at bedtime.  Current Outpatient Medications (Respiratory):    albuterol (VENTOLIN HFA) 108 (90 Base) MCG/ACT inhaler, Inhale into the lungs every 6 (six) hours as needed.  Current Outpatient Medications (Analgesics):    aspirin EC 81 MG tablet, Take 81 mg by  mouth daily. Swallow whole.   meloxicam (MOBIC) 15 MG tablet, Take 15 mg by mouth daily as needed.   naproxen (NAPROSYN) 375 MG tablet, Take 1 tablet (375 mg total) by mouth 2 (two) times daily.   Current Outpatient Medications (Other):    Capsaicin-Menthol-Methyl Sal 0.001-10-20 % LOTN, Apply to knee daily for pain relief.   Cholecalciferol 125 MCG (5000 UT) TABS, Take by mouth.   cyclobenzaprine (FLEXERIL) 5 MG tablet, Take 5 mg by mouth 3 (three) times daily as needed.   docusate sodium (COLACE) 250 MG capsule, Take 1 capsule (250 mg total) by mouth daily.   famotidine (PEPCID) 40 MG tablet, Take 1 tablet (40 mg total) by mouth at bedtime.   lidocaine (XYLOCAINE) 5 % ointment, Apply 1 application topically as needed.   nortriptyline (PAMELOR) 25 MG capsule, TAKE 1 CAPSULE BY MOUTH AT BEDTIME.   senna (SENOKOT) 8.6 MG TABS tablet, Take 1 tablet by mouth daily.   Spacer/Aero-Holding Chambers (EASIVENT) inhaler, Use as instructed with inhaler.  Medical History:  Past Medical History:  Diagnosis Date   Abdominal bloating 12/22/2020   Bleeding nose 12/22/2020   COVID-19 12/22/2020   COVID infection June 2022.    H/O cesarean section    x3 perpatient   Hypertension    Vitamin D deficiency 07/21/2020   Allergies: No Known Allergies   Surgical History:  She  has a past surgical history that includes Cesarean section (N/A). Family History:  Her family history includes Colon cancer in her sister; Diabetes in her paternal aunt, paternal grandmother, and paternal uncle; Healthy in her mother; High Cholesterol in her maternal aunt; High blood pressure in her maternal aunt. Social History:   reports that she has been smoking cigarettes. She has never used smokeless tobacco. She reports that she does not drink alcohol and does not use drugs.  REVIEW OF SYSTEMS  : All other systems reviewed and negative except where noted in the History of Present Illness.   PHYSICAL EXAM: BP (!) 132/98     Pulse (!) 116    Ht 5' (1.524 m)    Wt 186 lb 4 oz (84.5 kg)    BMI 36.37 kg/m  General:   Pleasant, well developed female in no acute distress Head:  Normocephalic and atraumatic. Eyes: sclerae anicteric,conjunctive pink  Heart:  regular rate and rhythm Pulm: Clear anteriorly; no wheezing Abdomen:  Soft, Obese AB, skin exam diastasis/ventral hernia, Sluggish bowel sounds. mild tenderness in the lower abdomen. Without guarding and Without rebound, without hepatomegaly. Extremities:  Without edema. Msk:  Symmetrical without gross deformities. Peripheral pulses intact.  Neurologic:  Alert and  oriented x4;  grossly normal neurologically. Skin:   Dry and intact without significant lesions or rashes. Psychiatric: Demonstrates good judgement and reason without abnormal affect or behaviors.   Vladimir Crofts, PA-C 11:06 AM

## 2021-04-23 ENCOUNTER — Other Ambulatory Visit (INDEPENDENT_AMBULATORY_CARE_PROVIDER_SITE_OTHER): Payer: Medicaid Other

## 2021-04-23 ENCOUNTER — Ambulatory Visit: Payer: Medicaid Other | Admitting: Physician Assistant

## 2021-04-23 ENCOUNTER — Encounter: Payer: Self-pay | Admitting: Physician Assistant

## 2021-04-23 ENCOUNTER — Other Ambulatory Visit: Payer: Self-pay

## 2021-04-23 VITALS — BP 132/98 | HR 116 | Ht 60.0 in | Wt 186.2 lb

## 2021-04-23 DIAGNOSIS — R140 Abdominal distension (gaseous): Secondary | ICD-10-CM

## 2021-04-23 DIAGNOSIS — R1030 Lower abdominal pain, unspecified: Secondary | ICD-10-CM | POA: Diagnosis not present

## 2021-04-23 DIAGNOSIS — N3001 Acute cystitis with hematuria: Secondary | ICD-10-CM

## 2021-04-23 DIAGNOSIS — K5904 Chronic idiopathic constipation: Secondary | ICD-10-CM | POA: Diagnosis not present

## 2021-04-23 LAB — COMPREHENSIVE METABOLIC PANEL
ALT: 14 U/L (ref 0–35)
AST: 12 U/L (ref 0–37)
Albumin: 4.5 g/dL (ref 3.5–5.2)
Alkaline Phosphatase: 62 U/L (ref 39–117)
BUN: 10 mg/dL (ref 6–23)
CO2: 30 mEq/L (ref 19–32)
Calcium: 9.8 mg/dL (ref 8.4–10.5)
Chloride: 105 mEq/L (ref 96–112)
Creatinine, Ser: 0.83 mg/dL (ref 0.40–1.20)
GFR: 88.09 mL/min (ref >60.00)
Glucose, Bld: 90 mg/dL (ref 70–99)
Potassium: 4 mEq/L (ref 3.5–5.1)
Sodium: 138 mEq/L (ref 135–145)
Total Bilirubin: 0.4 mg/dL (ref 0.2–1.2)
Total Protein: 7.5 g/dL (ref 6.0–8.3)

## 2021-04-23 LAB — CBC WITH DIFFERENTIAL/PLATELET
Basophils Absolute: 0 10*3/uL (ref 0.0–0.1)
Basophils Relative: 0.9 % (ref 0.0–3.0)
Eosinophils Absolute: 0.1 10*3/uL (ref 0.0–0.7)
Eosinophils Relative: 3.1 % (ref 0.0–5.0)
HCT: 40.7 % (ref 36.0–46.0)
Hemoglobin: 13.4 g/dL (ref 12.0–15.0)
Lymphocytes Relative: 39.3 % (ref 12.0–46.0)
Lymphs Abs: 1.9 10*3/uL (ref 0.7–4.0)
MCHC: 33 g/dL (ref 30.0–36.0)
MCV: 92.5 fl (ref 78.0–100.0)
Monocytes Absolute: 0.3 10*3/uL (ref 0.1–1.0)
Monocytes Relative: 7.4 % (ref 3.0–12.0)
Neutro Abs: 2.3 10*3/uL (ref 1.4–7.7)
Neutrophils Relative %: 49.3 % (ref 43.0–77.0)
Platelets: 284 10*3/uL (ref 150.0–400.0)
RBC: 4.4 Mil/uL (ref 3.87–5.11)
RDW: 13.1 % (ref 11.5–15.5)
WBC: 4.7 10*3/uL (ref 4.0–10.5)

## 2021-04-23 LAB — TSH: TSH: 2.15 u[IU]/mL (ref 0.35–5.50)

## 2021-04-23 MED ORDER — FAMOTIDINE 40 MG PO TABS: 40.0000 mg | ORAL_TABLET | Freq: Every day | ORAL | 2 refills | Status: DC

## 2021-04-23 NOTE — Patient Instructions (Addendum)
Your provider has requested that you go to the basement level for lab work before leaving today. Press "B" on the elevator. The lab is located at the first door on the left as you exit the elevator.  You have been scheduled for a CT scan of the abdomen and pelvis at [facility], 1st floor Radiology. You are scheduled on 04-30-21  at 8:30am. You should arrive 30 minutes prior to your appointment time for registration.  Please pick up 2 bottles of contrast from [place] at least 3 days prior to your scan. The solution may taste better if refrigerated, but do NOT add ice or any other liquid to this solution. Shake well before drinking.   Please follow the written instructions below on the day of your exam:   1) Do not eat anything after 4:30am (4 hours prior to your test)   2) Drink 1 bottle of contrast @ 6:30am (2 hours prior to your exam)  Remember to shake well before drinking and do NOT pour over ice.     Drink 1 bottle of contrast @ 7:30am (1 hour prior to your exam)   You may take any medications as prescribed with a small amount of water, if necessary. If you take any of the following medications: METFORMIN, GLUCOPHAGE, GLUCOVANCE, AVANDAMET, RIOMET, FORTAMET, [place] MET, JANUMET, GLUMETZA or METAGLIP, you MAY be asked to HOLD this medication 48 hours AFTER the exam.   The purpose of you drinking the oral contrast is to aid in the visualization of your intestinal tract. The contrast solution may cause some diarrhea. Depending on your individual set of symptoms, you may also receive an intravenous injection of x-ray contrast/dye. Plan on being at [facility] for 45 minutes or longer, depending on the type of exam you are having performed.   If you have any questions regarding your exam or if you need to reschedule, you may call [facility] at [phone] between the hours of 8:00 am and 5:00 pm, Monday-Friday.   Due to recent changes in healthcare laws, you may see the  results of your imaging and laboratory studies on MyChart before your provider has had a chance to review them.  We understand that in some cases there may be results that are confusing or concerning to you. Not all laboratory results come back in the same time frame and the provider may be waiting for multiple results in order to interpret others.  Please give [place] 48 hours in order for your provider to thoroughly review all the results before contacting the office for clarification of your results.   Aleve, ibuprofen, mobic is an antiinflammatory  You can take tylenol (5107m) or tylenol arthritis (6521m with the meloxicam/antiinflammatories. The max you can take of tylenol a day is 300041maily, this is a max of 6 pills a day of the regular tyelnol (500m36mr a max of 4 a day of the tylenol arthritis (650mg49m long as no other medications you are taking contain tylenol.   This can cause inflammation in your stomach and can cause ulcers or bleeding, this will look like black tarry stools Make sure you taking it with food Try not to take it daily, take AS needed Can take with Pepcid  Linzess  Take at least 30 minutes before the first meal of the day on an empty stomach You can have a loose stool if you eat a high-fat breakfast. Give it at least 4 days, may have more bowel  movements during that time.  If you continue to have severe diarrhea try every other day, if you get AB pain with it stop.  After you are out we can send in a prescription if you did well, there is a prescription card  Linaclotide oral capsules What is this medicine? LINACLOTIDE (lin a KLOE tide) is used to treat irritable bowel syndrome (IBS) with constipation as the main problem. It may also be used for relief of chronic constipation. This medicine may be used for other purposes; ask your health care provider or pharmacist if you have questions. COMMON BRAND NAME(S): Linzess What should I tell my health care provider before  I take this medicine? They need to know if you have any of these conditions: history of stool (fecal) impaction now have diarrhea or have diarrhea often other medical condition stomach or intestinal disease, including bowel obstruction or abdominal adhesions an unusual or allergic reaction to linaclotide, other medicines, foods, dyes, or preservatives pregnant or trying to get pregnant breast-feeding How should I use this medicine? Take this medicine by mouth with a glass of water. Follow the directions on the prescription label. Do not cut, crush or chew this medicine. Take on an empty stomach, at least 30 minutes before your first meal of the day. Take your medicine at regular intervals. Do not take your medicine more often than directed. Do not stop taking except on your doctor's advice. A special MedGuide will be given to you by the pharmacist with each prescription and refill. Be sure to read this information carefully each time. Talk to your pediatrician regarding the use of this medicine in children. This medicine is not approved for use in children. Overdosage: If you think you have taken too much of this medicine contact a poison control center or emergency room at once. NOTE: This medicine is only for you. Do not share this medicine with others. What if I miss a dose? If you miss a dose, just skip that dose. Wait until your next dose, and take only that dose. Do not take double or extra doses. What may interact with this medicine? certain medicines for bowel problems or bladder incontinence (these can cause constipation) This list may not describe all possible interactions. Give your health care provider a list of all the medicines, herbs, non-prescription drugs, or dietary supplements you use. Also tell them if you smoke, drink alcohol, or use illegal drugs. Some items may interact with your medicine. What should I watch for while using this medicine? Visit your doctor for regular  check ups. Tell your doctor if your symptoms do not get better or if they get worse. Diarrhea is a common side effect of this medicine. It often begins within 2 weeks of starting this medicine. Stop taking this medicine and call your doctor if you get severe diarrhea. Stop taking this medicine and call your doctor or go to the nearest hospital emergency room right away if you develop unusual or severe stomach-area (abdominal) pain, especially if you also have bright red, bloody stools or black stools that look like tar. What side effects may I notice from receiving this medicine? Side effects that you should report to your doctor or health care professional as soon as possible: allergic reactions like skin rash, itching or hives, swelling of the face, lips, or tongue black, tarry stools bloody or watery diarrhea new or worsening stomach pain severe or prolonged diarrhea Side effects that usually do not require medical attention (report to your  doctor or health care professional if they continue or are bothersome): bloating gas loose stools This list may not describe all possible side effects. Call your doctor for medical advice about side effects. You may report side effects to FDA at 1-800-FDA-1088. Where should I keep my medicine? Keep out of the reach of children. Store at room temperature between 20 and 25 degrees C (68 and 77 degrees F). Keep this medicine in the original container. Keep tightly closed in a dry place. Do not remove the desiccant packet from the bottle, it helps to protect your medicine from moisture. Throw away any unused medicine after the expiration date. NOTE: This sheet is a summary. It may not cover all possible information. If you have questions about this medicine, talk to your doctor, pharmacist, or health care provider.  2020 Elsevier/Gold Standard (2015-03-23 12:17:04)  Here some information about pelvic floor dysfunction. This may be contributing to some of her  symptoms. We could also refer to pelvic floor physical therapy.   Pelvic Floor Dysfunction, Female Pelvic floor dysfunction (PFD) is a condition that results when the group of muscles and connective tissues that support the organs in the pelvis (pelvic floor muscles) do not work well. These muscles and their connections form a sling that supports the colon and bladder. In women, they also support the uterus. PFD causes pelvic floor muscles to be too weak, too tight, or both. In PFD, muscle movements are not coordinated. This may cause bowel or bladder problems. It may also cause pain. What are the causes? This condition may be caused by an injury to the pelvic area or by a weakening of pelvic muscles. This often results from pregnancy and childbirth or other types of strain. In many cases, the exact cause is not known. What increases the risk? The following factors may make you more likely to develop this condition: Having chronic bladder tissue inflammation (interstitial cystitis). Being an older person. Being overweight. History of radiation treatment for cancer in the pelvic region. Previous pelvic surgery, such as removal of the uterus (hysterectomy). What are the signs or symptoms? Symptoms of this condition vary and may include: Bladder symptoms, such as: Trouble starting urination and emptying the bladder. Frequent urinary tract infections. Leaking urine when coughing, laughing, or exercising (stress incontinence). Having to pass urine urgently or frequently. Pain when passing urine. Bowel symptoms, such as: Constipation. Urgent or frequent bowel movements. Incomplete bowel movements. Painful bowel movements. Leaking stool or gas. Unexplained genital or rectal pain. Genital or rectal muscle spasms. Low back pain. Other symptoms may include: A heavy, full, or aching feeling in the vagina. A bulge that protrudes into the vagina. Pain during or after sex. How is this  diagnosed? This condition may be diagnosed based on: Your symptoms and medical history. A physical exam. During the exam, your health care provider may check your pelvic muscles for tightness, spasm, pain, or weakness. This may include a rectal exam and a pelvic exam. In some cases, you may have diagnostic tests, such as: Electrical muscle function tests. Urine flow testing. X-ray tests of bowel function. Ultrasound of the pelvic organs. How is this treated? Treatment for this condition depends on the symptoms. Treatment options include: Physical therapy. This may include Kegel exercises to help relax or strengthen the pelvic floor muscles. Biofeedback. This type of therapy provides feedback on how tight your pelvic floor muscles are so that you can learn to control them. Internal or external massage therapy. A treatment that involves  electrical stimulation of the pelvic floor muscles to help control pain (transcutaneous electrical nerve stimulation, or TENS). Sound wave therapy (ultrasound) to reduce muscle spasms. Medicines, such as: Muscle relaxants. Bladder control medicines. Surgery to reconstruct or support pelvic floor muscles may be an option if other treatments do not help. Follow these instructions at home: Activity Do your usual activities as told by your health care provider. Ask your health care provider if you should modify any activities. Do pelvic floor strengthening or relaxing exercises at home as told by your physical therapist. Lifestyle Maintain a healthy weight. Eat foods that are high in fiber, such as beans, whole grains, and fresh fruits and vegetables. Limit foods that are high in fat and processed sugars, such as fried or sweet foods. Manage stress with relaxation techniques such as yoga or meditation. General instructions If you have problems with leakage: Use absorbable pads or wear padded underwear. Wash frequently with mild soap. Keep your genital and  anal area as clean and dry as possible. Ask your health care provider if you should try a barrier cream to prevent skin irritation. Take warm baths to relieve pelvic muscle tension or spasms. Take over-the-counter and prescription medicines only as told by your health care provider. Keep all follow-up visits. How is this prevented? The cause of PFD is not always known, but there are a few things you can do to reduce the risk of developing this condition, including: Staying at a healthy weight. Getting regular exercise. Managing stress. Contact a health care provider if: Your symptoms are not improving with home care. You have signs or symptoms of PFD that get worse at home. You develop new signs or symptoms. You have signs of a urinary tract infection, such as: Fever. Chills. Increased urinary frequency. A burning feeling when urinating. You have not had a bowel movement in 3 days (constipation). Summary Pelvic floor dysfunction results when the muscles and connective tissues in your pelvic floor do not work well. These muscles and their connections form a sling that supports your colon and bladder. In women, they also support the uterus. PFD may be caused by an injury to the pelvic area or by a weakening of pelvic muscles. PFD causes pelvic floor muscles to be too weak, too tight, or a combination of both. Symptoms may vary from person to person. In most cases, PFD can be treated with physical therapies and medicines. Surgery may be an option if other treatments do not help. This information is not intended to replace advice given to you by your health care provider. Make sure you discuss any questions you have with your health care provider. Document Revised: 06/28/2020 Document Reviewed: 06/28/2020 Elsevier Patient Education  [place].

## 2021-04-24 LAB — TISSUE TRANSGLUTAMINASE, IGA: (tTG) Ab, IgA: <1 U/mL

## 2021-04-24 LAB — IGA: Immunoglobulin A: 95 mg/dL (ref 47–310)

## 2021-04-25 ENCOUNTER — Telehealth: Payer: Self-pay

## 2021-04-25 DIAGNOSIS — M542 Cervicalgia: Secondary | ICD-10-CM | POA: Diagnosis not present

## 2021-04-25 DIAGNOSIS — I10 Essential (primary) hypertension: Secondary | ICD-10-CM | POA: Diagnosis not present

## 2021-04-25 DIAGNOSIS — Z6836 Body mass index (BMI) 36.0-36.9, adult: Secondary | ICD-10-CM | POA: Diagnosis not present

## 2021-04-25 DIAGNOSIS — I671 Cerebral aneurysm, nonruptured: Secondary | ICD-10-CM | POA: Diagnosis not present

## 2021-04-25 DIAGNOSIS — M5412 Radiculopathy, cervical region: Secondary | ICD-10-CM | POA: Diagnosis not present

## 2021-04-25 NOTE — Telephone Encounter (Signed)
Per Dr. Derek Mound request, pt has been scheduled for 1-2 month f/u on 06/25/21 @ 340pm. Appt reminder has been mailed.

## 2021-04-25 NOTE — Telephone Encounter (Signed)
-----   Message from Doree Albee, [place] sent at 04/25/2021  3:53 PM EST ----- Regarding: Appointment Please schedule 2 month OV with me or Dr. Leonides Schanz, Thanks ----- Message ----- From: Imogene Burn, MD Sent: 04/25/2021   3:29 PM EST To: Doree Albee, PA-C     ----- Message ----- From: Javier Glazier Sent: 04/23/2021  11:08 AM EST To: Imogene Burn, MD

## 2021-04-25 NOTE — Progress Notes (Signed)
I agree with assessment and plan as outlined by Ms. Steffanie Dunn. Please arrange for follow up in 1-2 months with Ms. Steffanie Dunn or me.

## 2021-04-30 ENCOUNTER — Other Ambulatory Visit: Payer: Self-pay

## 2021-04-30 ENCOUNTER — Ambulatory Visit (HOSPITAL_COMMUNITY)
Admission: RE | Admit: 2021-04-30 | Discharge: 2021-04-30 | Disposition: A | Discharge status: Home / Self Care | Payer: Medicaid Other | Source: Ambulatory Visit | Attending: Physician Assistant | Admitting: Physician Assistant

## 2021-04-30 ENCOUNTER — Encounter (HOSPITAL_COMMUNITY): Payer: Self-pay

## 2021-04-30 DIAGNOSIS — R1030 Lower abdominal pain, unspecified: Secondary | ICD-10-CM | POA: Diagnosis present

## 2021-04-30 DIAGNOSIS — N3001 Acute cystitis with hematuria: Secondary | ICD-10-CM | POA: Diagnosis not present

## 2021-04-30 DIAGNOSIS — R109 Unspecified abdominal pain: Secondary | ICD-10-CM | POA: Diagnosis not present

## 2021-04-30 MED ORDER — IOHEXOL 300 MG/ML  SOLN
100.0000 mL | Freq: Once | INTRAMUSCULAR | Status: AC | PRN
  Administered 2021-04-30: 100 mL via INTRAVENOUS

## 2021-04-30 MED ORDER — SODIUM CHLORIDE (PF) 0.9 % IJ SOLN
INTRAMUSCULAR | Status: AC
  Filled 2021-04-30: qty 50

## 2021-05-08 ENCOUNTER — Other Ambulatory Visit: Payer: Self-pay | Admitting: Family Medicine

## 2021-05-08 ENCOUNTER — Ambulatory Visit: Payer: Medicaid Other | Admitting: Family Medicine

## 2021-05-08 NOTE — Progress Notes (Signed)
No show letter for PM clinic on 05/08/2021. ?Fayette Pho, MD ? ?
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
## 2021-05-22 ENCOUNTER — Encounter: Payer: Self-pay | Admitting: Neurology

## 2021-05-22 ENCOUNTER — Other Ambulatory Visit: Payer: Self-pay | Admitting: Family Medicine

## 2021-05-22 ENCOUNTER — Encounter: Payer: Self-pay | Admitting: Family Medicine

## 2021-05-22 ENCOUNTER — Other Ambulatory Visit: Payer: Self-pay

## 2021-05-22 ENCOUNTER — Ambulatory Visit (INDEPENDENT_AMBULATORY_CARE_PROVIDER_SITE_OTHER): Payer: Medicaid Other | Admitting: Family Medicine

## 2021-05-22 ENCOUNTER — Ambulatory Visit: Payer: Medicaid Other | Admitting: Neurology

## 2021-05-22 VITALS — BP 138/86 | HR 100 | Wt 189.0 lb

## 2021-05-22 VITALS — BP 152/109 | HR 75 | Resp 18 | Ht 60.0 in | Wt 188.0 lb

## 2021-05-22 DIAGNOSIS — G43011 Migraine without aura, intractable, with status migrainosus: Secondary | ICD-10-CM | POA: Diagnosis not present

## 2021-05-22 DIAGNOSIS — I10 Essential (primary) hypertension: Secondary | ICD-10-CM | POA: Diagnosis not present

## 2021-05-22 DIAGNOSIS — I671 Cerebral aneurysm, nonruptured: Secondary | ICD-10-CM

## 2021-05-22 DIAGNOSIS — R4589 Other symptoms and signs involving emotional state: Secondary | ICD-10-CM | POA: Diagnosis present

## 2021-05-22 DIAGNOSIS — M25561 Pain in right knee: Secondary | ICD-10-CM

## 2021-05-22 DIAGNOSIS — I773 Arterial fibromuscular dysplasia: Secondary | ICD-10-CM

## 2021-05-22 DIAGNOSIS — R200 Anesthesia of skin: Secondary | ICD-10-CM

## 2021-05-22 MED ORDER — UBRELVY 100 MG PO TABS: ORAL_TABLET | ORAL | 0 refills | Status: DC

## 2021-05-22 MED ORDER — ZONISAMIDE 25 MG PO CAPS: ORAL_CAPSULE | ORAL | 0 refills | Status: DC

## 2021-05-22 NOTE — Progress Notes (Signed)
? ? ?SUBJECTIVE:  ? ?CHIEF COMPLAINT / HPI:  ? ?Mood disorder ?Patient presents today intermittently tearful, saying that she believes she has depression.  She reports trying to work through it on her own, but is finally asking for help.  See PHQ-9 below.  No intent or plan for SI, children are protective factor.  She is amenable to medication.  MDQ scanned into chart, question #1 has score of 10, indicating possible bipolar depression. ? ?GI labs follow up ?Patient reports undergoing blood work and CT scan last month with GI and would like to discuss the findings.  She is unsure what the labs mean and does not have follow-up anytime soon with GI. ? ?HTN ?Previously elevated readings at prior [facility] visits and also at neurology clinic today.  Current regimen lisinopril 10 mg, verapamil 120 mg.  Tolerating both well, no adverse effects.  Denies dizziness, syncope, or hypotension. ? ?BP Readings from Last 3 Encounters:  ?05/22/21 138/86  ?05/22/21 (!) 152/109  ?04/23/21 (!) 132/98  ?  ? ?PERTINENT  PMH / PSH:  ?Patient Active Problem List  ? Diagnosis Date Noted  ? Depressed mood 05/22/2021  ? Knee pain 04/04/2021  ? Cervical spondylosis 04/04/2021  ? Chronic myofascial pain 04/04/2021  ? Chronic complaint of left-sided weakness 01/15/2021  ? Smoking   ? Hypertension 12/22/2020  ? Anxiety about dying 12/22/2020  ? Healthcare maintenance 12/22/2020  ? Class 2 obesity due to excess calories with body mass index (BMI) of 36.0 to 36.9 in adult   ? Obstructive sleep apnea 12/21/2020  ? Fibromuscular dysplasia (HCC) 09/14/2020  ? Mixed hyperlipidemia 07/21/2020  ? Microscopic hematuria 05/24/2020  ? Cerebral aneurysm without rupture 04/09/2019  ? History of prediabetes 04/09/2019  ? Migraine without aura and with status migrainosus, not intractable 04/09/2019  ? Contraception management 10/09/2017  ? Mild intermittent asthma without complication 01/20/2014  ?  ?OBJECTIVE:  ? ?BP 138/86   Pulse 100   Wt 189 lb (85.7 kg)    SpO2 98%   BMI 36.91 kg/m?   ?PHQ-9:  ?Depression screen [facility] 2/9 04/04/2021 01/15/2021  ?Decreased Interest 1 1  ?Down, Depressed, Hopeless 0 0  ?PHQ - 2 Score 1 1  ?Altered sleeping 0 1  ?Tired, decreased energy 1 1  ?Change in appetite 0 0  ?Feeling bad or failure about yourself  0 0  ?Trouble concentrating 0 0  ?Moving slowly or fidgety/restless 0 0  ?Suicidal thoughts 0 0  ?PHQ-9 Score 2 3  ?Difficult doing work/chores - Somewhat difficult  ?  ?GAD-7: No flowsheet data found. ?  ?Physical Exam ?General: Awake, alert, oriented, intermittently tearful ?Respiratory: Unlabored respirations, speaking in full sentences, no respiratory distress ?Extremities: Moving all extremities spontaneously ?Psych: Normal insight and judgement, tearful ? ?ASSESSMENT/PLAN:  ? ?Hypertension ?Chronic, stable.  At goal today.  Tolerating meds well without side effect continue current regimen of lisinopril 10 mg and verapamil 120 mg.  No changes at this time.  ? ?Depressed mood ?Subacute, worsening.  PHQ-9 today worsened from last.  No intent or plan for SI, children are protective factor.  She is amenable to medication and therapy.  MDQ score of 10, indicating possible bipolar depression.  Refer to psychiatry for medication management.  Provided resources on therapists who take her insurance.  Provided emergency behavioral health resources.  See AVS for more.  Return precautions discussed.  Follow-up in 2 to 3 weeks. ?  ? ? ?Fayette Pho, MD ?[facility]  ?

## 2021-05-22 NOTE — Assessment & Plan Note (Signed)
Chronic, stable.  At goal today.  Tolerating meds well without side effect continue current regimen of lisinopril 10 mg and verapamil 120 mg.  No changes at this time.  ?

## 2021-05-22 NOTE — Assessment & Plan Note (Addendum)
Subacute, worsening.  PHQ-9 today worsened from last.  No intent or plan for SI, children are protective factor.  She is amenable to medication and therapy.  MDQ score of 10, indicating possible bipolar depression.  Refer to psychiatry for medication management.  Provided resources on therapists who take her insurance.  Provided emergency behavioral health resources.  See AVS for more.  Return precautions discussed.  Follow-up in 2 to 3 weeks. ?

## 2021-05-22 NOTE — Patient Instructions (Addendum)
It was wonderful to see you today. Thank you for allowing me to be a part of your care. Below is a short summary of what we discussed at your visit today: ? ?GI labs discussion ?Today we also talked about your GI labs for your constipation. Overall, they looked normal! No evidence of celiac disease, anemia, or thyroid disease.  Your kidney and liver function looked normal.  Your CT scan of your abdomen did not show anything remarkable aside from a small hiatal hernia, which is not likely to cause any of your symptoms. ? ?Disability  ?If you are wanting disability for the aneurysm and migraines, talk to the neurology office directly.  I recommend calling them and asking them how they would like to proceed with that kind of paperwork. ? ?Depressed mood ?Below are some resources to help. Today we talked about using medication and therapy together.  ? ?I have referred you to psychiatry for mood medication.  Someone from their office should be calling you in 1 weeks to schedule an appointment.  If you do not hear from them, let us know. We may need to nudge along the referral.   ? ?[facility] ?**Available as walk-in brain health care 24/7** ?[phone] ?[address], [place], [place] [address] ? ?[email or web address]/[email or web address]-[email or web address]-health-centers ? ? ? ?   ? ? ?Therapy and Counseling Resources ?Most providers on this list will take Medicaid. Patients with commercial insurance or Medicare should contact their insurance company to get a list of in network providers. ? ?[facility] (spanish speaking therapist available)(habla espanol)  ?[address], [place], [place] [address], [place] ?[email or web address] ?[phone] ? ?[facility]iatry and Counseling ?[address] [place], [place] [address] ?[phone] ? ?[facility] ? [address]   [place], [place] [address]      [phone] ? ?[facility] ?[address]  [place], [place] [address] ?[phone] ? ?[facility] ?[address]  [place], [place] [address]       [phone]    ? ?[facility] (virtual only) ?[phone] ? ?[facility] ?[address], [place], [place] [phone] ? ?[facility]:  [address] [place] [phone] ? ?[facility]:  ?Coralie Carpen[address], [place] [phone] ? ?[facility] (under & uninsured) ?[address]   [place] [phone]    [email or web address]   ? ?[facility] ?[address]  [place]    [phone] ? ?[facility] ?[facility] -[address]     Phone:  [phone]     [facility]-  [address]  [phone]  ? ?[facility] ?[address]      [place], [place] [phone] ? ?[facility]: [address], [place], [place]  [phone]  ? ?[facility] (Spanish therapist) [email or web address]://[email or web address]  ?[address]   [place] [address]    [phone]   ? ?[facility]   ?[address],  [place], [place]  [phone]  ? ?[facility]  ?[address] [place]  [phone] ? ?[facility]  ?[address] [place], [place]        [phone] ? ?Open Access/Walk In Clinic under & uninsured ? ?[facility]  ?[address] [place], [place] ?Front Line [phone] ?Crisis [phone] ? ?[facility]  of the [address],  ?(Spanish)   [address], [place]: [phone]) 8:30 - 12; 1 - 2:30 ? ?[facility] [employer],  ?[address] [place]    [phone]):8:30 - 12; 2 - 3PM ? ?[facility] [employer],  ?[address]  [place]; [phone]):   Mon - Fri 8 AM - 5 PM ? ?[facility] ?[address] [place]  MWF 12:30 to 3:00 or call  to schedule an appointment  [phone] ? ?Specific Provider options ?Psychology Today  [email or web address].[email or web address]/us ?click on find a therapist  ?enter your zip code ?left side and select or tailor a therapist for your specific need.  ? ?[facility] Provider Directory ?[email or web address]/  (Medicaid)   Follow all drop down to find a provider ? ?Social Support program ?[facility] ?336) I7437963 or PhotoSolver.pl ?[address], [place], [place] support and educational  ? ?24- Hour Availability:  ? ?[facility]  ?[address] [place], [place] ?Front Line [phone] ?Crisis [phone] ? ?[facility] [phone] ? ?[employer] [facility]  [phone]  ? ?[facility] [employer]  [phone] (after hours) ? ?Therapeutic Alternative/Mobile Crisis   [phone] ? ?[place] National Suicide Hotline  931-109-4080 Len Childs) ? ?Call 911 or go to emergency room ? ?[facility]  [phone]);  [place]  ? ?[facility]  ?[phone]); [place], [place], [place], [place], [place], [place], [place]  ? ?Psychiatry Resource List (Adults and Children) ?Most of these providers will take Medicaid. please consult your insurance for a complete and updated list of available providers. When calling to make an appointment have your insurance information available to confirm you are covered. ? ?[facility] ?[address] [place], [place] [address] ?[phone] ? ?[facility]  ?[address] [place], [place] ?Front Line [phone] ?Crisis [phone] ? ?[facility]:   ?[place]: [address]     [phone]   ?[place]: [address] [id number],        [phone] ?[place]: [address] [phone],    [phone] ?5 ?[facility]ath[facility]ne Sharper: [address],                    [phone] ?Children: [facility] [address] [phone] ? ?[facility] (virtual only) ?[phone] ? ?[facility]  (Psychiatry only; Adults /children 12 and over, will take Medicaid)  ?[address], [place], [place] [address]       [phone] ? ?[facility] (Psychiatry & counseling ; adults & children ; will take Medicaid ?[address]  [place] [address]  ?Go on-line to complete referral ( https://www.[email or web address].org/en/make-[email or web address]-referral ?3390532807    ?(Spanish speaking therapists) ? ?[facility]  Psychiatry & counseling; Adults and children;  ?Call Registration prior to scheduling an appointment [phone] ?[address]    [place], [place] [address]    [phone] ? ?[facility] (Psychiatry & counseling; adults & children; Medicare no Medicaid)  ?[address]   [place], [place]  [address]      [phone]   ? ?[facility] (up to age 55)  ?Psychiatry & counseling ,will take Medicaid, must do counseling to receive psychiatry services  ?[address] [place]  [address]        [phone] ? ?[facility] (Psychiatry & counseling; adults & children; will take Medicaid) ?Will need a referral from provider ?[address],  [place], [place]  [phone] ? ?[facility] --- Walk-In Mon-Friday 8am-3pm ( will take Medicaid, Psychiatry, Adults & children,  ?[address] [place], [place]   [phone] ? ?[facility] [place]--, Walk-in M-F 8am-12pm and 1pm -3pm   ?(Counseling, Psychiatry, will take Medicaid, adults & children) ? [address] [place], [place]  [phone]    ? ? ? ? ?Please bring all of your medications to every appointment! ? ?If you have any questions or concerns, please do not hesitate to contact us via phone or MyChart message.  ? ?Fayette Pho, MD  ?

## 2021-05-22 NOTE — Patient Instructions (Signed)
Start zonisamide 25mg  daily for 7 days, then 50mg  daily for 7 days, then 75mg  daily for 7 days, then 100mg  daily. If no improvement in 3 months, contact me and we can increase dose. ?Stop nortriptyliine ?Take Ubrelvy at earliest onset of migraine.  May repeat after 2 hours.  Maximum 2 tablets in 24 hours.  If effective, contact me for prescription ?Stop over the counter analgesics ?Follow up 6 months. ?

## 2021-05-23 ENCOUNTER — Telehealth: Payer: Self-pay | Admitting: Physician Assistant

## 2021-05-23 MED ORDER — LINACLOTIDE 290 MCG PO CAPS: 290.0000 ug | ORAL_CAPSULE | Freq: Every day | ORAL | 6 refills | Status: DC

## 2021-05-23 NOTE — Telephone Encounter (Signed)
Called patient the samples we gave her was Linzess 290 mcg.  I sent her in a prescription to her pharmacy today  ?

## 2021-05-23 NOTE — Telephone Encounter (Signed)
Patient called to request [facility] prescription. Patient states Quentin Mulling provided her with a sample pack for [facility] to see how effective it was. Per patient, Linzess worked instantly. Patient requested prescription be sent to [facility] on [address] [place], [place], [place] [address]. Please advise ?

## 2021-05-28 ENCOUNTER — Telehealth: Payer: Self-pay

## 2021-05-28 ENCOUNTER — Other Ambulatory Visit (HOSPITAL_COMMUNITY): Payer: Self-pay

## 2021-05-28 NOTE — Telephone Encounter (Signed)
Patient Advocate Encounter ?  ?Received notification from [facility] that prior authorization for Ubrelvy 100mg  tabs is required by his/her insurance [place] [employer] [place] [employer]. ?  ?PA submitted on 05/28/21 ? ?Key#: [id number] ? ?Status is pending ?   ?[facility] will continue to follow: ? ?Patient Advocate ?Fax:  [phone]  ?

## 2021-05-29 ENCOUNTER — Other Ambulatory Visit: Payer: Self-pay | Admitting: Licensed Clinical Social Worker

## 2021-05-29 DIAGNOSIS — R4589 Other symptoms and signs involving emotional state: Secondary | ICD-10-CM

## 2021-05-29 DIAGNOSIS — F418 Other specified anxiety disorders: Secondary | ICD-10-CM

## 2021-05-29 NOTE — Patient Instructions (Signed)
Visit Information ? ?Ms. Sassone was given information about Medicaid Managed Care team care coordination services as a part of their Healthy [facility] Medicaid benefit. Ave Scharnhorst verbally consented to engagement with the [facility] Managed Care team.  ? ?If you are experiencing a medical emergency, please call 911 or report to your local emergency department or urgent care.  ? ?If you have a non-emergency medical problem during routine business hours, please contact your provider's office and ask to speak with a nurse.  ? ?For questions related to your Healthy [facility] health plan, please call: [phone] or visit the homepage here: [email or web address] ? ?If you would like to schedule transportation through your Healthy [facility] plan, please call the following number at least 2 days in advance of your appointment: [phone] ? For information about your ride after you set it up, call Ride Assist at [phone]. Use this number to activate a Will Call pickup, or if your transportation is late for a scheduled pickup. Use this number, too, if you need to make a change or cancel a previously scheduled reservation. ? If you need transportation services right away, call [phone]. The after-hours call center is staffed 24 hours to handle ride assistance and urgent reservation requests (including discharges) 365 days a year. Urgent trips include sick visits, hospital discharge requests and life-sustaining treatment. ? ?Call the [facility] Line at [phone], at any time, 24 hours a day, 7 days a week. If you are in danger or need immediate medical attention call 911. ? ?If you would like help to quit smoking, call 1-800-QUIT-NOW (8641189695) OR Espa?ol: 1-855-D?jelo-Ya 985-888-4152) o para m?s informaci?n haga clic aqu? or Text READY to 200-400 to register via text ? ?Following is a copy of your plan of care:  ?Care Plan : LCSW Plan of Care  ?Updates  made by Gustavus Bryant, LCSW since 05/29/2021 12:00 AM  ?  ? ?Problem: Depression Identification (Depression)   ?  ? ?Goal: To get established with a psychiatry   ?Start Date: 05/29/2021  ?Priority: High  ?Note:   ?Priority: High ? ?Timeframe:  Long-Range Goal ?Priority:  High ?Start Date:   05/29/21             ?Expected End Date:  ongoing                   ?  ?Follow Up Date--06/29/21 ? ? Current barriers:   ?Chronic Mental Health needs related to depression, stress and anxiety. Mental Health Needs related to Depression, requires Support, Education, Resources, Referrals, Advocacy, and Care Coordination, in order to meet Unmet Mental Health Needs. ?Mental Health Concerns, Lack of social support network, financial hardships and Social Isolation ?Patient lacks knowledge of available community counseling agencies and resources. ? ?Clinical Goal(s): verbalize understanding of plan for management of Depression and Stress. To demonstrate a reduction in symptoms related to : Depression, connect with provider for ongoing mental health treatment.  , and increase coping skills, healthy habits, self-management skills, and stress reduction     ?   ?Patient Goals/Self-Care Activities: Over the next 120 days ?Attend scheduled medical appointments ?Utilize healthy coping skills and supportive resources discussed ?Contact PCP with any questions or concerns ?Keep 90 percent of appointments ?Call your insurance provider for more information about your Enhanced Benefits  ?Check out counseling resources provided  ?Begin personal counseling with LCSW, to reduce and manage symptoms of Depression until well-established with mental health provider ?Accept all calls from representative with [facility] in  an effort to establish ongoing mental health counseling and supportive services. ?Incorporate into daily practice - relaxation techniques, deep breathing exercises, and mindfulness meditation strategies. ?Talk about feelings with friends, family  members, spiritual advisor, etc.. ?Contact LCSW directly [phone]), if you have questions, need assistance, or if additional social work needs are identified between now and our next scheduled telephone outreach call. ?Call [phone] for mental health hotline/crisis line if needed (24/7 available) ?Try techniques to reduce symptoms of anxiety (deep breathing, distraction, positive self talk, etc)  ? ?Dickie La, BSW, MSW, LCSW ?Managed Medicaid LCSW ?[place]  [facility] ?[email or web address] ?Phone: [phone] ? ? ?  ? ?  ?

## 2021-05-29 NOTE — Patient Outreach (Signed)
Medicaid Managed Care Social Work Note  05/29/2021 Name:  Christine Gordon MRN:  [id number] DOB:  August 20, 1980  Christine Gordon is an 41 y.o. year old female who is a primary Christine Gordon of Fayette Pho, MD.  The Medicaid Managed Care Coordination team was consulted for assistance with:  Mental Health Counseling and Resources  Christine Gordon was given information about Medicaid Managed Care Coordination team services today. Christine Gordon Christine Gordon agreed to services and verbal consent obtained.  Engaged with Christine Gordon  for by telephone forinitial visit in response to referral for case management and/or care coordination services.   Assessments/Interventions:  Review of past medical history, allergies, medications, health status, including review of consultants reports, laboratory and other test data, was performed as part of comprehensive evaluation and provision of chronic care management services.  SDOH: (Social Determinant of Health) assessments and interventions performed: SDOH Interventions    Flowsheet Row Most Recent Value  SDOH Interventions   Stress Interventions Provide Counseling, Offered [employer], Other (Comment)  [Referral to [employer]  Depression Interventions/Treatment  Referral to Psychiatry       Advanced Directives Status:  See Care Plan for related entries.  Care Plan                 No Known Allergies  Medications Reviewed Today     Reviewed by Fayette Pho, MD (Resident) on 05/22/21 at 1859  Med List Status: <None>   Medication Order Taking? Sig Documenting Provider Last Dose Status Informant  albuterol (VENTOLIN HFA) 108 (90 Base) MCG/ACT inhaler [id number] No Inhale into the lungs every 6 (six) hours as needed. [provider] Taking Active   aspirin EC 81 MG tablet [id number] No Take 81 mg by mouth daily. Swallow whole. [provider] Taking Active   Capsaicin-Menthol-Methyl Sal 0.001-10-20 % LOTN [id number] No Apply to knee daily for  pain relief. Fayette Pho, MD Taking Active   Cholecalciferol 125 MCG (5000 UT) TABS [id number] No Take by mouth. [provider] Taking Active   cyclobenzaprine (FLEXERIL) 5 MG tablet [id number] No Take 5 mg by mouth 3 (three) times daily as needed. [provider] Taking Active   docusate sodium (COLACE) 250 MG capsule [id number] No Take 1 capsule (250 mg total) by mouth daily. Fayette Pho, MD Taking Active   famotidine (PEPCID) 40 MG tablet [id number] No Take 1 tablet (40 mg total) by mouth at bedtime. Doree Albee, PA-C Taking Active   lidocaine (XYLOCAINE) 5 % ointment [id number]  APPLY 1 APPLICATION TOPICALLY AS NEEDED. Fayette Pho, MD  Active   lisinopril (ZESTRIL) 10 MG tablet [id number] No Take 1 tablet (10 mg total) by mouth at bedtime. Fayette Pho, MD Taking Active   medroxyPROGESTERone Acetate 150 MG/ML SUSY [id number] No Inject into the muscle. [provider] Taking Active   meloxicam (MOBIC) 15 MG tablet [id number] No Take 15 mg by mouth daily as needed. [provider] Taking Active   naproxen (NAPROSYN) 375 MG tablet [id number] No Take 1 tablet (375 mg total) by mouth 2 (two) times daily. Gordon, Christine S, PA-C Taking Active   rosuvastatin (CRESTOR) 10 MG tablet [id number] No Take 10 mg by mouth daily. [provider] Taking Active   senna (SENOKOT) 8.6 MG TABS tablet [id number] No Take 1 tablet by mouth daily. [provider] Taking Active   Spacer/Aero-Holding Chambers (EASIVENT) inhaler [id number] No Use as instructed with inhaler. [provider] Taking Active   Ubrogepant (UBRELVY) 100 MG TABS [id number]  Medication Samples have  been provided to the Christine Gordon.  Drug name: Bernita Raisin       Strength: 100mg         Qty: 4  LOT: [id number]  Exp.Date: 08/2022  Dosing instructions:   The Christine Gordon has been instructed regarding the correct time, dose, and frequency of taking this medication, including desired effects and  most common side effects.   Christine Gordon 11:35 AM 05/22/2021 Drema Dallas, DO  Active   verapamil (CALAN-SR) 120 MG CR tablet [id number] No Take 1 tablet (120 mg total) by mouth at bedtime. Fayette Pho, MD Taking Active   zonisamide (ZONEGRAN) 25 MG capsule [id number]  Take 1 capsule daily for 7 days, then 2 capsules daily for 7 days, then 3 capsules daily for 7 days, then 4 capsules daily Drema Dallas, DO  Active             Christine Gordon Active Problem List   Diagnosis Date Noted   Depressed mood 05/22/2021   Knee pain 04/04/2021   Cervical spondylosis 04/04/2021   Chronic myofascial pain 04/04/2021   Chronic complaint of left-sided weakness 01/15/2021   Smoking    Hypertension 12/22/2020   Anxiety about dying 12/22/2020   Healthcare maintenance 12/22/2020   Class 2 obesity due to excess calories with body mass index (BMI) of 36.0 to 36.9 in adult    Obstructive sleep apnea 12/21/2020   Fibromuscular dysplasia (HCC) 09/14/2020   Mixed hyperlipidemia 07/21/2020   Microscopic hematuria 05/24/2020   Cerebral aneurysm without rupture 04/09/2019   History of prediabetes 04/09/2019   Migraine without aura and with status migrainosus, not intractable 04/09/2019   Contraception management 10/09/2017   Mild intermittent asthma without complication 01/20/2014    Conditions to be addressed/monitored per PCP order:  Anxiety and Depression  Care Plan : LCSW Plan of Care  Updates made by Christine Bryant, LCSW since 05/29/2021 12:00 AM     Problem: Depression Identification (Depression)      Goal: To get established with a psychiatry   Start Date: 05/29/2021  Priority: High  Note:   Priority: High  Timeframe:  Long-Range Goal Priority:  High Start Date:   05/29/21             Expected End Date:  ongoing                     Follow Up Date--06/29/21   Current barriers:   Chronic Mental Health needs related to depression, stress and anxiety. Mental Health Needs related to  Depression, requires Support, Education, Resources, Referrals, Advocacy, and Care Coordination, in order to meet Unmet Mental Health Needs. Mental Health Concerns, Lack of social support network, financial hardships and Social Isolation Christine Gordon lacks knowledge of available community counseling agencies and resources.  Clinical Goal(s): verbalize understanding of plan for management of Depression and Stress. To demonstrate a reduction in symptoms related to : Depression, connect with provider for ongoing mental health treatment.  , and increase coping skills, healthy habits, self-management skills, and stress reduction           Clinical Interventions:  Assessed Christine Gordon's previous and current treatment, coping skills, support system and barriers to care. Christine Gordon provided hx  Verbalization of feelings encouraged, motivational interviewing employed Emotional support provided, positive coping strategies explored Self care/establishing healthy boundaries emphasized Christine Gordon reports that she is experiencing ongoing depression and would like treatment. She was educated on available mental health resources nearby her area. She is only interested in psychiatry at this  time and will consider counseling. Christine Gordon reports significant worsening depression impacting her ability to function daily. Christine Gordon endorsed that she has stable transportation  Christine Gordon has a lack of a social support network. She lives with her son but her son is not a reliable source of support at this time.  Christine Gordon is agreeable to referral to [facility] for psychiatry. [facility] LCSW made referral on 05/29/21.  Christine Gordon will work on implementing appropriate self-care habits into their daily routine such as: drinking water, staying active around the house, taking their medications as prescribed, combating negative thoughts or emotions and staying connected with their family and friends. Positive reinforcement provided for this decision to work on  this. Christine Gordon has "anxiety over dying" as a dx in her chart. LCSW provided education on relaxation techniques such as meditation, deep breathing, massage, grounding exercises or yoga that can activate the body's relaxation response and ease symptoms of stress and anxiety. LCSW ask that when pt is struggling with difficult emotions and racing thoughts that they start this relaxation response process. LCSW provided extensive education on healthy coping skills for anxiety. SW used active and reflective listening, validated Christine Gordon's feelings/concerns, and provided emotional support.  Motivational Interviewing employed Depression screen reviewed  PHQ2/ PHQ9 completed Mindfulness or Relaxation training provided Active listening / Reflection utilized  Advance Care and HCPOA education provided Emotional Support Provided Problem Solving /Task Center strategies reviewed Provided psychoeducation for mental health needs  Provided brief CBT  Reviewed mental health medications and discussed importance of compliance:  Quality of sleep assessed & Sleep Hygiene techniques promoted  Participation in counseling encouraged  Verbalization of feelings encouraged  [facility] BSW referral made on 05/29/21 for financial hardships. Per PCP, she has limited family or community support and is considering disability but experiencing financial issues due to medical conditions. Appointment was scheduled with Christine Gordon to speak with [facility] BSW on 06/07/21.  Suicidal Ideation/Homicidal Ideation assessed: Christine Gordon denies SI/HI  Review resources, discussed options and provided Christine Gordon information about  Mental Health Resources Inter-disciplinary care team collaboration (see longitudinal plan of care) Christine Gordon Goals/Self-Care Activities: Over the next 120 days Attend scheduled medical appointments Utilize healthy coping skills and supportive resources discussed Contact PCP with any questions or concerns Keep 90 percent of appointments Call  your insurance provider for more information about your Enhanced Benefits  Check out counseling resources provided  Begin personal counseling with LCSW, to reduce and manage symptoms of Depression until well-established with mental health provider Accept all calls from representative with [facility] in an effort to establish ongoing mental health counseling and supportive services. Incorporate into daily practice - relaxation techniques, deep breathing exercises, and mindfulness meditation strategies. Talk about feelings with friends, family members, spiritual advisor, etc.. Contact LCSW directly [phone]), if you have questions, need assistance, or if additional social work needs are identified between now and our next scheduled telephone outreach call. Call [phone] for mental health hotline/crisis line if needed (24/7 available) Try techniques to reduce symptoms of anxiety (deep breathing, distraction, positive self talk, etc)      Follow up:  Christine Gordon agrees to Care Plan and Follow-up.  Plan: The Managed Medicaid care management team will reach out to the Christine Gordon again over the next 30 days.  Date/time of next scheduled Social Work care management/care coordination outreach:  06/29/21 at 1:00 pm  Dickie La, BSW, MSW, [employer] Managed Medicaid LCSW [facility]  [facility] [email or web address] Phone: [phone]

## 2021-05-31 NOTE — Telephone Encounter (Addendum)
Patient Advocate Encounter ? ?Received notification from [employer] that prior authorization for UBRELVY 100MG  is required, AND NEEDED ADDITIONAL INFORMATION. ?  ?PA submitted on 3.30.23 ?Key [id number] ?Status is pending ?  ?[facility] will continue to follow ? ?Revecca Nachtigal R Mavin Dyke, CPhT ?Patient Advocate ?Phone: [phone] ?Fax:  [phone] ? ?

## 2021-06-04 ENCOUNTER — Telehealth (HOSPITAL_COMMUNITY): Payer: Self-pay | Admitting: Pharmacy Technician

## 2021-06-04 NOTE — Telephone Encounter (Signed)
Please See Denial and Advise: ? ? ? ?Roland Earl, CPHT ?Pharmacy Patient Advocate Specialist ?[facility] Pharm[facility] Patient Advocate Team ?Direct Number: [phone]  Fax: [phone] ? ?  ?

## 2021-06-06 ENCOUNTER — Ambulatory Visit: Payer: Medicaid Other

## 2021-06-06 NOTE — Telephone Encounter (Signed)
Following up

## 2021-06-06 NOTE — Therapy (Incomplete)
?OUTPATIENT PHYSICAL THERAPY LOWER EXTREMITY EVALUATION ? ? ?Patient Name: Christine Gordon ?MRN: [id number] ?DOB:1981/03/04, 41 y.o., female ?Today's Date: 06/06/2021 ? ? ? ?Past Medical History:  ?Diagnosis Date  ? Abdominal bloating 12/22/2020  ? Bleeding nose 12/22/2020  ? COVID-19 12/22/2020  ? COVID infection June 2022.   ? H/O cesarean section   ? x3 perpatient  ? Hypertension   ? Vitamin D deficiency 07/21/2020  ? ?Past Surgical History:  ?Procedure Laterality Date  ? CESAREAN SECTION N/A   ? x3 per patient  ? ?Patient Active Problem List  ? Diagnosis Date Noted  ? Depressed mood 05/22/2021  ? Knee pain 04/04/2021  ? Cervical spondylosis 04/04/2021  ? Chronic myofascial pain 04/04/2021  ? Chronic complaint of left-sided weakness 01/15/2021  ? Smoking   ? Hypertension 12/22/2020  ? Anxiety about dying 12/22/2020  ? Healthcare maintenance 12/22/2020  ? Class 2 obesity due to excess calories with body mass index (BMI) of 36.0 to 36.9 in adult   ? Obstructive sleep apnea 12/21/2020  ? Fibromuscular dysplasia (HCC) 09/14/2020  ? Mixed hyperlipidemia 07/21/2020  ? Microscopic hematuria 05/24/2020  ? Cerebral aneurysm without rupture 04/09/2019  ? History of prediabetes 04/09/2019  ? Migraine without aura and with status migrainosus, not intractable 04/09/2019  ? Contraception management 10/09/2017  ? Mild intermittent asthma without complication 01/20/2014  ? ? ?PCP: Fayette Pho, MD ? ?REFERRING PROVIDER: Moses Manners, MD ? ?REFERRING DIAG: Acute pain of right knee ? ?THERAPY DIAG:  ?No diagnosis found. ? ?ONSET DATE: *** ? ?SUBJECTIVE:  ? ?SUBJECTIVE STATEMENT: ?*** ? ?PERTINENT HISTORY: ?*** ? ?PAIN:  ?Are you having pain? {OPRCPAIN:27236} ? ?PRECAUTIONS: {Therapy precautions:24002} ? ?WEIGHT BEARING RESTRICTIONS {Yes ***/No:24003} ? ?FALLS:  ?Has patient fallen in last 6 months? {fallsyesno:27318} ? ?LIVING ENVIRONMENT: ?Lives with: {OPRC lives with:25569::"lives with their family"} ?Lives in: {Lives  in:25570} ?Stairs: {opstairs:27293} ?Has following equipment at home: {Assistive devices:23999} ? ?OCCUPATION: *** ? ?PLOF: {PLOF:24004} ? ?PATIENT GOALS *** ? ? ?OBJECTIVE:  ? ?DIAGNOSTIC FINDINGS:  ?R knee 03/16/21 Xray ?FINDINGS: ?No evidence of fracture, dislocation, or joint effusion. No evidence ?of arthropathy or other focal bone abnormality. Soft tissues are ?unremarkable. ?  ?IMPRESSION: ?Negative. ? ? ?PATIENT SURVEYS:  ?{rehab surveys:24030} ? ?COGNITION: ? Overall cognitive status: {cognition:24006}   ?  ?SENSATION: ?{sensation:27233} ? ?MUSCLE LENGTH: ?Hamstrings: Right *** deg; Left *** deg ?Thomas test: Right *** deg; Left *** deg ? ?POSTURE:  ?*** ? ?PALPATION: ?*** ? ?LE ROM: ? ?{AROM/PROM:27142} ROM Right ?06/06/2021 Left ?06/06/2021  ?Hip flexion    ?Hip extension    ?Hip abduction    ?Hip adduction    ?Hip internal rotation    ?Hip external rotation    ?Knee flexion    ?Knee extension    ?Ankle dorsiflexion    ?Ankle plantarflexion    ?Ankle inversion    ?Ankle eversion    ? (Blank rows = not tested) ? ?LE MMT: ? ?MMT Right ?06/06/2021 Left ?06/06/2021  ?Hip flexion    ?Hip extension    ?Hip abduction    ?Hip adduction    ?Hip internal rotation    ?Hip external rotation    ?Knee flexion    ?Knee extension    ?Ankle dorsiflexion    ?Ankle plantarflexion    ?Ankle inversion    ?Ankle eversion    ? (Blank rows = not tested) ? ?LOWER EXTREMITY SPECIAL TESTS:  ?{LEspecialtests:26242} ? ?FUNCTIONAL TESTS:  ?{Functional tests:24029} ? ?GAIT: ?Distance walked: *** ?Assistive device utilized: {  Assistive devices:23999} ?Level of assistance: {Levels of assistance:24026} ?Comments: *** ? ? ? ?TODAY'S TREATMENT: ?*** ? ? ?PATIENT EDUCATION:  ?Education details: *** ?Person educated: {Person educated:25204} ?Education method: {Education Method:25205} ?Education comprehension: {Education Comprehension:25206} ? ? ?HOME EXERCISE PROGRAM: ?*** ? ?ASSESSMENT: ? ?CLINICAL IMPRESSION: ?Patient is a *** y.o. *** who was seen  today for physical therapy evaluation and treatment for ***.  ? ? ?OBJECTIVE IMPAIRMENTS {opptimpairments:25111}.  ? ?ACTIVITY LIMITATIONS {activity limitations:25113}.  ? ?PERSONAL FACTORS {Personal factors:25162} are also affecting patient's functional outcome.  ? ? ?REHAB POTENTIAL: {rehabpotential:25112} ? ?CLINICAL DECISION MAKING: {clinical decision making:25114} ? ?EVALUATION COMPLEXITY: {Evaluation complexity:25115} ? ? ?GOALS: ?Goals reviewed with patient? {yes/no:20286} ? ?SHORT TERM GOALS: Target date: {follow up:25551} ? ?*** ?Baseline: ?Goal status: {GOALSTATUS:25110} ? ?2.  *** ?Baseline:  ?Goal status: {GOALSTATUS:25110} ? ?3.  *** ?Baseline:  ?Goal status: {GOALSTATUS:25110} ? ?4.  *** ?Baseline:  ?Goal status: {GOALSTATUS:25110} ? ?5.  *** ?Baseline:  ?Goal status: {GOALSTATUS:25110} ? ?6.  *** ?Baseline:  ?Goal status: {GOALSTATUS:25110} ? ?LONG TERM GOALS: Target date: {follow up:25551} ? ?*** ?Baseline:  ?Goal status: {GOALSTATUS:25110} ? ?2.  *** ?Baseline:  ?Goal status: {GOALSTATUS:25110} ? ?3.  *** ?Baseline:  ?Goal status: {GOALSTATUS:25110} ? ?4.  *** ?Baseline:  ?Goal status: {GOALSTATUS:25110} ? ?5.  *** ?Baseline:  ?Goal status: {GOALSTATUS:25110} ? ?6.  *** ?Baseline:  ?Goal status: {GOALSTATUS:25110} ? ? ?PLAN: ?PT FREQUENCY: {rehab frequency:25116} ? ?PT DURATION: {rehab duration:25117} ? ?PLANNED INTERVENTIONS: {rehab planned interventions:25118::"Therapeutic exercises","Therapeutic activity","Neuromuscular re-education","Balance training","Gait training","Patient/Family education","Joint mobilization"} ? ?PLAN FOR NEXT SESSION: *** ? ? ?Joellyn Rued, PT ?06/06/2021, 6:09 AM  ?

## 2021-06-07 ENCOUNTER — Other Ambulatory Visit: Payer: Self-pay

## 2021-06-07 ENCOUNTER — Other Ambulatory Visit (HOSPITAL_COMMUNITY): Payer: Self-pay

## 2021-06-07 NOTE — Telephone Encounter (Signed)
Follow up.

## 2021-06-07 NOTE — Patient Instructions (Signed)
Visit Information ? ?Christine Gordon was given information about Medicaid Managed Care team care coordination services as a part of their [facility] benefit. Clerance LavRosalind Heckman verbally consented to engagement with [facility].  ? ?If you are experiencing a medical emergency, please call 911 or report to your local emergency department or urgent care.  ? ?If you have a non-emergency medical problem during routine business hours, please contact your provider's office and ask to speak with a nurse.  ? ?For questions related to your [facility] Medicaid health plan, please call: [phone] or visit the homepage here: [email or web address] ? ?If you would like to schedule transportation through your Healthy [facility] Medicaid plan, please call the following number at least 2 days in advance of your appointment: [phone] ? For information about your ride after you set it up, call Ride Assist at [phone]. Use this number to activate a Will Call pickup, or if your transportation is late for a scheduled pickup. Use this number, too, if you need to make a change or cancel a previously scheduled reservation. ? If you need transportation services right away, call [phone]. The after-hours call center is staffed 24 hours to handle ride assistance and urgent reservation requests (including discharges) 365 days a year. Urgent trips include sick visits, hospital discharge requests and life-sustaining treatment. ? ?Call the [facility] Crisis Line at [phone], at any time, 24 hours a day, 7 days a week. If you are in danger or need immediate medical attention call 911. ? ?If you would like help to quit smoking, call 1-800-QUIT-NOW (30317334351-(404)831-6339) OR Espa?ol: 1-855-D?jelo-Ya 2082900922(1-231 411 9871) o para m?s informaci?n haga clic aqu? or Text READY to 200-400 to register via text ? ?Ms. [name] - following are the goals we discussed in your visit today:  ? Goals  Addressed   ?None ?  ? ? ?Social Worker will follow up in 14 days .  ? ?Christine PumaAlexis Dhiya Gordon, BSW, [place] ?[facility]  [place]  ?High Risk Managed Medicaid Team  ?[phone]  ? ?Following is a copy of your plan of care:  ?Care Plan : LCSW Plan of Care  ?Updates made by Shaune LeeksShields, Juanita Streight J since 06/07/2021 12:00 AM  ?  ? ?Problem: Depression Identification (Depression)   ?  ? ?Goal: To get established with a psychiatry   ?Start Date: 05/29/2021  ?Priority: High  ?Note:   ?Priority: High ? ?Timeframe:  Long-Range Goal ?Priority:  High ?Start Date:   05/29/21             ?Expected End Date:  ongoing                   ?  ?Follow Up Date--06/29/21 ? ? Current barriers:   ?Chronic Mental Health needs related to depression, stress and anxiety. Mental Health Needs related to Depression, requires Support, Education, Resources, Referrals, Advocacy, and Care Coordination, in order to meet Unmet Mental Health Needs. ?Mental Health Concerns, Lack of social support network, financial hardships and Social Isolation ?Patient lacks knowledge of available community counseling agencies and resources. ? ?Clinical Goal(s): verbalize understanding of plan for management of Depression and Stress. To demonstrate a reduction in symptoms related to : Depression, connect with provider for ongoing mental health treatment.  , and increase coping skills, healthy habits, self-management skills, and stress reduction     ?   ?   ?Clinical Interventions:  ?Assessed patient's previous and current treatment, coping skills, support system and barriers to care. Patient  provided hx  ?Verbalization of feelings encouraged, motivational interviewing employed ?Emotional support provided, positive coping strategies explored ?Self care/establishing healthy boundaries emphasized ?Patient reports that she is experiencing ongoing depression and would like treatment. She was educated on available mental health resources nearby her area. She is only  interested in psychiatry at this time and will consider counseling. Patient reports significant worsening depression impacting her ability to function daily. ?Patient endorsed that she has stable transportation  ?Patient has a lack of a social support network. She lives with her son but her son is not a reliable source of support at this time.  ?Patient is agreeable to referral to [facility] for psychiatry. [facility] LCSW made referral on 05/29/21.  ?Patient will work on implementing appropriate self-care habits into their daily routine such as: drinking water, staying active around the house, taking their medications as prescribed, combating negative thoughts or emotions and staying connected with their family and friends. Positive reinforcement provided for this decision to work on this. ?Patient has "anxiety over dying" as a dx in her chart. LCSW provided education on relaxation techniques such as meditation, deep breathing, massage, grounding exercises or yoga that can activate the body's relaxation response and ease symptoms of stress and anxiety. LCSW ask that when pt is struggling with difficult emotions and racing thoughts that they start this relaxation response process. LCSW provided extensive education on healthy coping skills for anxiety. SW used active and reflective listening, validated patient's feelings/concerns, and provided emotional support. ? ?Motivational Interviewing employed ?Depression screen reviewed  ?PHQ2/ PHQ9 completed ?Mindfulness or Relaxation training provided ?Active listening / Reflection utilized  ?Advance Care and HCPOA education provided ?Emotional Support Provided ?Problem Solving /Task Center strategies reviewed ?Provided psychoeducation for mental health needs  ?Provided brief CBT  ?Reviewed mental health medications and discussed importance of compliance:  ?Quality of sleep assessed & Sleep Hygiene techniques promoted  ?Participation in counseling encouraged  ?Verbalization of feelings  encouraged  ?[facility] BSW referral made on 05/29/21 for financial hardships. Per PCP, she has limited family or community support and is considering disability but experiencing financial issues due to medical conditions. Appointment was scheduled with patient to speak with [facility] BSW on 06/07/21.  ?Suicidal Ideation/Homicidal Ideation assessed: Patient denies SI/HI  ?Review resources, discussed options and provided patient information about  ?Mental Health Resources ?Inter-disciplinary care team collaboration (see longitudinal plan of care) ?Patient Goals/Self-Care Activities: Over the next 120 days ?Attend scheduled medical appointments ?Utilize healthy coping skills and supportive resources discussed ?Contact PCP with any questions or concerns ?Keep 90 percent of appointments ?Call your insurance provider for more information about your Enhanced Benefits  ?Check out counseling resources provided  ?Begin personal counseling with LCSW, to reduce and manage symptoms of Depression until well-established with mental health provider ?Accept all calls from representative with [facility] in an effort to establish ongoing mental health counseling and supportive services. ?Incorporate into daily practice - relaxation techniques, deep breathing exercises, and mindfulness meditation strategies. ?Talk about feelings with friends, family members, spiritual advisor, etc.. ?Contact LCSW directly [phone]), if you have questions, need assistance, or if additional social work needs are identified between now and our next scheduled telephone outreach call. ?Call [phone] for mental health hotline/crisis line if needed (24/7 available) ?Try techniques to reduce symptoms of anxiety (deep breathing, distraction, positive self talk, etc)  ?06/07/21: BSW completed telephone outreach with patient. She stated she was able to pay her rent this month. At this time she does not need assistance with rent or  utilities, but does need assistance with household  goods. BSW informed patient she can contact [employer] and referred patient to [facility]. [name] [phone]. No other resources are needed at this time.  ?  ? ?  ?

## 2021-06-07 NOTE — Telephone Encounter (Signed)
Called Healthy [employer] @[phone].  The 2nd PA for [name] was denied.  They are faxing the denial letter and appeal process.  We need a letter of medical necessity from the provider to appeal. ?

## 2021-06-07 NOTE — Patient Outreach (Signed)
Medicaid Managed Care Social Work Note  06/07/2021 Name:  Christine Gordon MRN:  [id number] DOB:  1981-01-13  Christine Gordon is an 41 y.o. year old female who is a primary patient of Fayette Pho, MD.  The Medicaid Managed Care Coordination team was consulted for assistance with:  Community Resources   [name] was given information about Medicaid Managed Care Coordination team services today. Christine Gordon Patient agreed to services and verbal consent obtained.  Engaged with patient  for by telephone forinitial visit in response to referral for case management and/or care coordination services.   Assessments/Interventions:  Review of past medical history, allergies, medications, health status, including review of consultants reports, laboratory and other test data, was performed as part of comprehensive evaluation and provision of chronic care management services.  SDOH: (Social Determinant of Health) assessments and interventions performed:  BSW completed telephone outreach with patient. She stated she was able to pay her rent this month. At this time she does not need assistance with rent or utilities, but does need assistance with household goods. BSW informed patient she can contact [employer] and referred patient to [facility]. [name] [phone]. No other resources are needed at this time.   Advanced Directives Status:  Not addressed in this encounter.  Care Plan                 No Known Allergies  Medications Reviewed Today     Reviewed by Fayette Pho, MD (Resident) on 05/22/21 at 1859  Med List Status: <None>   Medication Order Taking? Sig Documenting Provider Last Dose Status Informant  albuterol (VENTOLIN HFA) 108 (90 Base) MCG/ACT inhaler [id number] No Inhale into the lungs every 6 (six) hours as needed. [provider] Taking Active   aspirin EC 81 MG tablet [id number] No Take 81 mg by mouth daily. Swallow whole. [provider] Taking Active    Capsaicin-Menthol-Methyl Sal 0.001-10-20 % LOTN [id number] No Apply to knee daily for pain relief. Fayette Pho, MD Taking Active   Cholecalciferol 125 MCG (5000 UT) TABS [id number] No Take by mouth. [provider] Taking Active   cyclobenzaprine (FLEXERIL) 5 MG tablet [id number] No Take 5 mg by mouth 3 (three) times daily as needed. [provider] Taking Active   docusate sodium (COLACE) 250 MG capsule [id number] No Take 1 capsule (250 mg total) by mouth daily. Fayette Pho, MD Taking Active   famotidine (PEPCID) 40 MG tablet [id number] No Take 1 tablet (40 mg total) by mouth at bedtime. Doree Albee, PA-C Taking Active   lidocaine (XYLOCAINE) 5 % ointment [id number]  APPLY 1 APPLICATION TOPICALLY AS NEEDED. Fayette Pho, MD  Active   lisinopril (ZESTRIL) 10 MG tablet [id number] No Take 1 tablet (10 mg total) by mouth at bedtime. Fayette Pho, MD Taking Active   medroxyPROGESTERone Acetate 150 MG/ML SUSY [id number] No Inject into the muscle. [provider] Taking Active   meloxicam (MOBIC) 15 MG tablet [id number] No Take 15 mg by mouth daily as needed. [provider] Taking Active   naproxen (NAPROSYN) 375 MG tablet [id number] No Take 1 tablet (375 mg total) by mouth 2 (two) times daily. Couture, Cortni S, PA-C Taking Active   rosuvastatin (CRESTOR) 10 MG tablet [id number] No Take 10 mg by mouth daily. [provider] Taking Active   senna (SENOKOT) 8.6 MG TABS tablet [id number] No Take 1 tablet by mouth daily. [provider] Taking Active   Spacer/Aero-Holding Chambers (EASIVENT) inhaler [id number] No Use as instructed  with inhaler. [provider] Taking Active   Ubrogepant (UBRELVY) 100 MG TABS [id number]  Medication Samples have been provided to the patient.  Drug name: Bernita Raisin       Strength: 100mg         Qty: 4  LOT: [id number]  Exp.Date: 08/2022  Dosing instructions:   The patient has been instructed regarding the  correct time, dose, and frequency of taking this medication, including desired effects and most common side effects.   Christine Gordon 11:35 AM 05/22/2021 Drema Dallas, DO  Active   verapamil (CALAN-SR) 120 MG CR tablet [id number] No Take 1 tablet (120 mg total) by mouth at bedtime. Fayette Pho, MD Taking Active   zonisamide (ZONEGRAN) 25 MG capsule [id number]  Take 1 capsule daily for 7 days, then 2 capsules daily for 7 days, then 3 capsules daily for 7 days, then 4 capsules daily Drema Dallas, DO  Active             Patient Active Problem List   Diagnosis Date Noted   Depressed mood 05/22/2021   Knee pain 04/04/2021   Cervical spondylosis 04/04/2021   Chronic myofascial pain 04/04/2021   Chronic complaint of left-sided weakness 01/15/2021   Smoking    Hypertension 12/22/2020   Anxiety about dying 12/22/2020   Healthcare maintenance 12/22/2020   Class 2 obesity due to excess calories with body mass index (BMI) of 36.0 to 36.9 in adult    Obstructive sleep apnea 12/21/2020   Fibromuscular dysplasia (HCC) 09/14/2020   Mixed hyperlipidemia 07/21/2020   Microscopic hematuria 05/24/2020   Cerebral aneurysm without rupture 04/09/2019   History of prediabetes 04/09/2019   Migraine without aura and with status migrainosus, not intractable 04/09/2019   Contraception management 10/09/2017   Mild intermittent asthma without complication 01/20/2014    Conditions to be addressed/monitored per PCP order:   community resources  Care Plan : LCSW Plan of Care  Updates made by Shaune Leeks since 06/07/2021 12:00 AM     Problem: Depression Identification (Depression)      Goal: To get established with a psychiatry   Start Date: 05/29/2021  Priority: High  Note:   Priority: High  Timeframe:  Long-Range Goal Priority:  High Start Date:   05/29/21             Expected End Date:  ongoing                     Follow Up Date--06/29/21   Current barriers:   Chronic Mental Health  needs related to depression, stress and anxiety. Mental Health Needs related to Depression, requires Support, Education, Resources, Referrals, Advocacy, and Care Coordination, in order to meet Unmet Mental Health Needs. Mental Health Concerns, Lack of social support network, financial hardships and Social Isolation Patient lacks knowledge of available community counseling agencies and resources.  Clinical Goal(s): verbalize understanding of plan for management of Depression and Stress. To demonstrate a reduction in symptoms related to : Depression, connect with provider for ongoing mental health treatment.  , and increase coping skills, healthy habits, self-management skills, and stress reduction           Clinical Interventions:  Assessed patient's previous and current treatment, coping skills, support system and barriers to care. Patient provided hx  Verbalization of feelings encouraged, motivational interviewing employed Emotional support provided, positive coping strategies explored Self care/establishing healthy boundaries emphasized Patient reports that she is experiencing ongoing depression and would like treatment. She  was educated on available mental health resources nearby her area. She is only interested in psychiatry at this time and will consider counseling. Patient reports significant worsening depression impacting her ability to function daily. Patient endorsed that she has stable transportation  Patient has a lack of a social support network. She lives with her son but her son is not a reliable source of support at this time.  Patient is agreeable to referral to [facility] for psychiatry. [facility] LCSW made referral on 05/29/21.  Patient will work on implementing appropriate self-care habits into their daily routine such as: drinking water, staying active around the house, taking their medications as prescribed, combating negative thoughts or emotions and staying connected with their family and  friends. Positive reinforcement provided for this decision to work on this. 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 LCSW provided education on relaxation techniques such as meditation, deep breathing, massage, grounding exercises or yoga that can activate the body's relaxation response and ease symptoms of stress and anxiety. LCSW ask that when pt is struggling with difficult emotions and racing thoughts that they start this relaxation response process. LCSW provided extensive education on healthy coping skills for anxiety. SW used active and reflective listening, validated patient's feelings/concerns, and provided emotional support.  Motivational Interviewing employed Depression screen reviewed  PHQ2/ PHQ9 completed Mindfulness or Relaxation training provided Active listening / Reflection utilized  Advance Care and HCPOA education provided Emotional Support Provided Problem Solving /Task Center strategies reviewed Provided psychoeducation for mental health needs  Provided brief CBT  Reviewed mental health medications and discussed importance of compliance:  Quality of sleep assessed & Sleep Hygiene techniques promoted  Participation in counseling encouraged  Verbalization of feelings encouraged  [facility] BSW referral made on 05/29/21 for financial hardships. Per PCP, she has limited family or community support and is considering disability but experiencing financial issues due to medical conditions. Appointment was scheduled with patient to speak with [facility] BSW on 06/07/21.  Suicidal Ideation/Homicidal Ideation assessed: Patient denies SI/HI  Review resources, discussed options and provided patient information about  Mental Health Resources Inter-disciplinary care team collaboration (see longitudinal plan of care) Patient Goals/Self-Care Activities: Over the next 120 days Attend scheduled medical appointments Utilize healthy coping skills and supportive resources discussed Contact PCP  with any questions or concerns Keep 90 percent of appointments Call your insurance provider for more information about your Enhanced Benefits  Check out counseling resources provided  Begin personal counseling with LCSW, to reduce and manage symptoms of Depression until well-established with mental health provider Accept all calls from representative with [facility] in an effort to establish ongoing mental health counseling and supportive services. Incorporate into daily practice - relaxation techniques, deep breathing exercises, and mindfulness meditation strategies. Talk about feelings with friends, family members, spiritual advisor, etc.. Contact LCSW directly [phone]), if you have questions, need assistance, or if additional social work needs are identified between now and our next scheduled telephone outreach call. Call 988 for mental health hotline/crisis line if needed (24/7 available) Try techniques to reduce symptoms of anxiety (deep breathing, distraction, positive self talk, etc)  06/07/21: BSW completed telephone outreach with patient. She stated she was able to pay her rent this month. At this time she does not need assistance with rent or utilities, but does need assistance with household goods. BSW informed patient she can contact [employer] and [facility] [name] [phone]. No other resources are needed at this time.  Follow up:  Patient agrees to Care Plan and Follow-up.  Plan: The Managed Medicaid care management team will reach out to the patient again over the next 14 days.  Date/time of next scheduled Social Work care management/care coordination outreach:  06/27/21  [name], Kenard Gower, [facility]  [facility]  High Risk Managed Medicaid Team  [phone]

## 2021-06-10 ENCOUNTER — Encounter: Payer: Self-pay | Admitting: Neurology

## 2021-06-11 ENCOUNTER — Telehealth (HOSPITAL_COMMUNITY): Payer: Self-pay | Admitting: Pharmacy Technician

## 2021-06-11 NOTE — Telephone Encounter (Signed)
Appeal Letter and Chart Notes were faxed to [facility] on 06/11/2021. ? ?Roland Earl, CPHT ?Pharmacy Patient Advocate Specialist ?[facility] Pharmacy Patient Advocate Team ?Direct Number: [phone]  Fax: [phone] ? ?  ?

## 2021-06-12 ENCOUNTER — Telehealth: Payer: Self-pay | Admitting: Neurology

## 2021-06-12 NOTE — Telephone Encounter (Signed)
ERROR

## 2021-06-12 NOTE — Telephone Encounter (Signed)
Alex from [employer] called. He left a message stating he needs some information for an appeal that was sent in. His number is [phone] ?

## 2021-06-13 ENCOUNTER — Ambulatory Visit: Payer: Medicaid Other | Admitting: Family Medicine

## 2021-06-13 VITALS — BP 147/89 | HR 92 | Wt 188.0 lb

## 2021-06-13 DIAGNOSIS — M545 Low back pain, unspecified: Secondary | ICD-10-CM | POA: Diagnosis present

## 2021-06-13 DIAGNOSIS — M25561 Pain in right knee: Secondary | ICD-10-CM

## 2021-06-13 MED ORDER — LIDOCAINE 5 % EX OINT: 1.0000 "application " | TOPICAL_OINTMENT | Freq: Two times a day (BID) | CUTANEOUS | 1 refills | Status: DC | PRN

## 2021-06-13 NOTE — Patient Instructions (Signed)
It was wonderful to see you today. Thank you for allowing me to be a part of your care. Below is a short summary of what we discussed at your visit today: ? ?Back pain ?Start using flexeril as needed.  ? ?Start using the lidocaine gel on your back as needed to help.  ? ?Refer to the back stretching book that I'm giving you today.  ? ?Go back to physical therapy and let them know you need to add on stretches for your lower back.  ? ?Cooking and Nutrition Classes ?The [employer] in [place] provides many classes at low or no cost to [employer], nutrition, and agriculture.  Their website offers a huge variety of information related to topics such as gardening, nutrition, cooking, parenting, and health.  Also listed are classes and events, both online and in-person.  Check out their website here: [email or web address].[email or web address]  ? ?Food finder app ?Download the [employer] App or Call 211 to easily find food banks and pantries and other resources nearby.  ? ?Therapy, anxiety, coping skills ?To find a therapist visit ItCheaper.dk. At this website you will be able to filter which providers take your click your insurance as well as other filters to fit your needs.  ? ? ? ?Please bring all of your medications to every appointment! ? ?If you have any questions or concerns, please do not hesitate to contact us via phone or MyChart message.  ? ?Fayette Pho, MD  ?

## 2021-06-14 ENCOUNTER — Encounter: Payer: Self-pay | Admitting: Family Medicine

## 2021-06-14 ENCOUNTER — Telehealth: Payer: Self-pay | Admitting: Neurology

## 2021-06-14 ENCOUNTER — Other Ambulatory Visit: Payer: Self-pay | Admitting: Neurology

## 2021-06-14 DIAGNOSIS — M545 Low back pain, unspecified: Secondary | ICD-10-CM | POA: Insufficient documentation

## 2021-06-14 NOTE — Telephone Encounter (Signed)
Caller needs to get the patients consent  for their appeal.  ? ?Melissa from healthy blue ?Fax  [phone] ?

## 2021-06-14 NOTE — Progress Notes (Signed)
? ? ?  SUBJECTIVE:  ? ?CHIEF COMPLAINT / HPI:  ? ?Back pain ?- One month history of acute bilateral lower back pain ?- No known prior traumas or accidents before onset of back pain ?- Currently works at [employer] and does a lot of lifting walking and standing on her feet during work ?- No identifiable aggravating factors ?- Heat relieves pain when applied, the pain returns once he is removed ?- Denies leg weakness, stumbling, legs giving out, saddle anesthesia, bowel or bladder incontinence ? ?PERTINENT  PMH / PSH: HTN, fibromuscular dysplasia, cerebral aneurysm without rupture, migraine without aura, cervical spondylosis, class II obesity, chronic myofascial pain ? ?OBJECTIVE:  ? ?BP (!) 147/89   Pulse 92   Wt 188 lb (85.3 kg)   SpO2 100%   BMI 36.72 kg/m?   ? ?PHQ-9:  ? ?  06/13/2021  ?  3:07 PM 05/29/2021  ?  2:07 PM 04/04/2021  ?  3:02 PM  ?Depression screen PHQ 2/9  ?Decreased Interest 2 1 1   ?Down, Depressed, Hopeless 2 1 0  ?PHQ - 2 Score 4 2 1   ?Altered sleeping 1 1 0  ?Tired, decreased energy 2 1 1   ?Change in appetite 1 0 0  ?Feeling bad or failure about yourself  0 0 0  ?Trouble concentrating 1 0 0  ?Moving slowly or fidgety/restless 0 0 0  ?Suicidal thoughts 0 0 0  ?PHQ-9 Score 9 4 2   ?Difficult doing work/chores Somewhat difficult Somewhat difficult   ?  ?GAD-7:  ? ?  06/13/2021  ?  4:07 PM  ?GAD 7 : Generalized Anxiety Score  ?Nervous, Anxious, on Edge 3  ?Control/stop worrying 3  ?Worry too much - different things 3  ?Trouble relaxing 1  ?Restless 1  ?Easily annoyed or irritable 1  ?Afraid - awful might happen 1  ?Total GAD 7 Score 13  ?Anxiety Difficulty Somewhat difficult  ? ?Physical Exam ?General: Awake, alert, oriented, no acute distress ?Respiratory: Unlabored respirations, speaking in full sentences, no respiratory distress ?Extremities: Moving all extremities spontaneously ?Neuro: Cranial nerves II through X grossly intact, BLE strength 5/5 and equal bilaterally ?MSK: Widespread tenderness to  palpation over bilateral lower back, no midline spinal tenderness ? ?ASSESSMENT/PLAN:  ? ?Acute bilateral low back pain without sciatica ?Subacute, 1 month duration.  No red flags.  BLE strength intact without observable deficit.  Suspect musculoskeletal in origin, recommend conservative measures.  Patient may use her home Flexeril.  Rx lidocaine cream.  Provided "the back book" resource with conservative approaches including stretching and light activity.  Return precautions given, see AVS for more. ?  ? ? ? , MD ?[facility]  ?

## 2021-06-14 NOTE — Assessment & Plan Note (Signed)
Subacute, 1 month duration.  No red flags.  BLE strength intact without observable deficit.  Suspect musculoskeletal in origin, recommend conservative measures.  Patient may use her home Flexeril.  Rx lidocaine cream.  Provided "the back book" resource with conservative approaches including stretching and light activity.  Return precautions given, see AVS for more. ?

## 2021-06-15 ENCOUNTER — Other Ambulatory Visit: Payer: Self-pay | Admitting: Neurology

## 2021-06-15 MED ORDER — ZONISAMIDE 100 MG PO CAPS: 100.0000 mg | ORAL_CAPSULE | Freq: Every day | ORAL | 5 refills | Status: DC

## 2021-06-19 ENCOUNTER — Ambulatory Visit: Payer: Medicaid Other | Attending: Family Medicine | Admitting: Physical Therapy

## 2021-06-19 DIAGNOSIS — M25561 Pain in right knee: Secondary | ICD-10-CM | POA: Insufficient documentation

## 2021-06-19 NOTE — Therapy (Signed)
Pt arrived stating that her R knee was "no issue" and not causing her pain.  She reports CC of back and neck pain.  I informed her that she would need a referral for her back and/or neck pain from her MD.  She confirms understanding and states she will call her MD. ?

## 2021-06-25 ENCOUNTER — Ambulatory Visit (INDEPENDENT_AMBULATORY_CARE_PROVIDER_SITE_OTHER): Payer: Medicaid Other | Admitting: Internal Medicine

## 2021-06-25 ENCOUNTER — Encounter: Payer: Self-pay | Admitting: Internal Medicine

## 2021-06-25 VITALS — BP 120/78 | HR 96 | Ht 60.0 in | Wt 188.2 lb

## 2021-06-25 DIAGNOSIS — R140 Abdominal distension (gaseous): Secondary | ICD-10-CM | POA: Diagnosis not present

## 2021-06-25 DIAGNOSIS — K5904 Chronic idiopathic constipation: Secondary | ICD-10-CM | POA: Diagnosis not present

## 2021-06-25 MED ORDER — LINACLOTIDE 290 MCG PO CAPS
290.0000 ug | ORAL_CAPSULE | Freq: Every day | ORAL | 6 refills | Status: DC
Start: 2021-06-25 — End: 2021-11-21

## 2021-06-25 NOTE — Patient Instructions (Addendum)
If you are age 41 or older, your body mass index should be between 23-30. Your Body mass index is 36.77 kg/m?Marland Kitchen If this is out of the aforementioned range listed, please consider follow up with your Primary Care Provider. ? ?If you are age 39 or younger, your body mass index should be between 19-25. Your Body mass index is 36.77 kg/m?Marland Kitchen If this is out of the aformentioned range listed, please consider follow up with your Primary Care Provide ? ?We have sent a refill of Linzess 290 mcg. ? ?Follow up in 3 months. ? ?The  GI providers would like to encourage you to use [facility] to communicate with providers for non-urgent requests or questions.  Due to long hold times on the telephone, sending your provider a message by [facility] may be a faster and more efficient way to get a response.  Please allow 48 business hours for a response.  Please remember that this is for non-urgent requests.  ? ?Thank you for entrusting me with your care and for choosing [facility], ?Dr. Eulah Pont ? ?

## 2021-06-25 NOTE — Progress Notes (Signed)
? ? ? ?06/25/2021 ?Christine Gordon ?[id number] ?06-30-1980 ? ? ?ASSESSMENT AND PLAN:  ? ?Christine Gordon was seen today for bloating and abdominal pain. ? ?Diagnoses and all orders for this visit: ? ?Chronic idiopathic constipation ?- Refilled Linzess 290 mcg QD ?- RTC 3 months ? ?Abdominal bloating ?- Constipation therapies as above ?- Continue low FODMAP diet ? ?GERD ?- Famotidine (PEPCID) 40 MG tablet; Take 1 tablet (40 mg total) by mouth at bedtime. ?- Reviewed lifestyle measures to help with GERD ? ?Ventral Hernia/Diastasis Recti ? - Will assess again during follow up visit ? ? ? ?Future Appointments  ?Date Time Provider Department Center  ?06/27/2021  1:30 PM [facility]MM SOCIAL WORKER 2 [facility] None  ?06/29/2021  1:00 PM [facility] CCC-MM SOCIAL WORKER [facility] None  ?07/18/2021  1:00 PM Ardith Dark I, LCSWA [facility] None  ?11/23/2021 10:10 AM Drema Dallas, DO [facility] None  ? ? ?Patient Care Team: ?Fayette Pho, MD as PCP - General (Family Medicine) ?[facility] as Nurse Practitioner (Family Medicine) ?El Husseini, Elisha Headland, MD as Consulting Physician (Neurology) ?Stephenie Acres, MD as Consulting Physician (Neurosurgery) ?Keith Rake, MD as Consulting Physician (Neurosurgery) ?Dorena Dew, MD as Consulting Physician (Physical Medicine and Rehabilitation) ?Gustavus Bryant, LCSW as [facility] Therapist, music (Licensed Visual merchandiser) ? ?HISTORY OF PRESENT ILLNESS: ?41 y.o. AA female referred by Fayette Pho, MD, with a past medical history of hypertension, migraine, sleep apnea, obesity presents for follow up of bloating ? ?Interval History: Patient has had significant improvement in her constipation since being on Linzess therapy. She is now having at least one BM per day. The Linzess causes her BMs to come on fairly quickly. Her abdominal bloating has also significantly improved. She has also tried to follow the low FODMAP diet with some success. She does still have  some issues with GERD, but she does feel like the famotidine is adequately controlling her symptoms. ? ?Labs and notes reviewed: ?Labs 04/2021: CBC and CMP unremarkable. TSH nml. TTG IgA negative. IgA nml. ? ?CT A/P w/contrast 04/30/21: ?IMPRESSION: ?1. No acute abdominopelvic findings. ?2. Small hiatal hernia. ? ?Current Medications:  ? ?Current Outpatient Medications (Endocrine & Metabolic):  ?  medroxyPROGESTERone Acetate 150 MG/ML SUSY, Inject into the muscle. ? ?Current Outpatient Medications (Cardiovascular):  ?  lisinopril (ZESTRIL) 10 MG tablet, Take 1 tablet (10 mg total) by mouth at bedtime. ?  rosuvastatin (CRESTOR) 10 MG tablet, Take 10 mg by mouth daily. ?  verapamil (CALAN-SR) 120 MG CR tablet, Take 1 tablet (120 mg total) by mouth at bedtime. ? ?Current Outpatient Medications (Respiratory):  ?  albuterol (VENTOLIN HFA) 108 (90 Base) MCG/ACT inhaler, Inhale into the lungs every 6 (six) hours as needed. ? ?Current Outpatient Medications (Analgesics):  ?  aspirin EC 81 MG tablet, Take 81 mg by mouth daily. Swallow whole. ?  meloxicam (MOBIC) 15 MG tablet, Take 15 mg by mouth daily as needed. ?  naproxen (NAPROSYN) 375 MG tablet, Take 1 tablet (375 mg total) by mouth 2 (two) times daily. ?  Ubrogepant (UBRELVY) 100 MG TABS, Medication Samples have been provided to the patient.  Drug name: Christine Gordon       Strength: 100mg         Qty: 4  LOT  Exp.Date: 08/2022  Dosing instructions:   The patient has been instructed regarding the correct time, dose, and frequency of taking this medication, including desired effects and most common side effects.  Christine Gordon 11:35 AM 05/22/2021 ? ? ?Current Outpatient Medications (Other):  ?  Capsaicin-Menthol-Methyl Sal 0.001-10-20 % LOTN, Apply to knee daily for pain relief. ?  Cholecalciferol 125 MCG (5000 UT) TABS, Take by mouth. ?  cyclobenzaprine (FLEXERIL) 5 MG tablet, Take 5 mg by mouth 3 (three) times daily as needed. ?  docusate sodium (COLACE) 250 MG  capsule, Take 1 capsule (250 mg total) by mouth daily. ?  lidocaine (XYLOCAINE) 5 % ointment, Apply 1 application. topically 2 (two) times daily as needed. To back area that hurts ?  linaclotide (LINZESS) 290 MCG CAPS capsule, Take 1 capsule (290 mcg total) by mouth daily before breakfast. ?  senna (SENOKOT) 8.6 MG TABS tablet, Take 1 tablet by mouth daily. ?  Spacer/Aero-Holding Chambers (EASIVENT) inhaler, Use as instructed with inhaler. ?  zonisamide (ZONEGRAN) 100 MG capsule, Take 1 capsule (100 mg total) by mouth daily. ?  famotidine (PEPCID) 40 MG tablet, Take 1 tablet (40 mg total) by mouth at bedtime. ? ?PHYSICAL EXAM: ?BP 120/78   Pulse 96   Ht 5' (1.524 m)   Wt 188 lb 4 oz (85.4 kg)   SpO2 99%   BMI 36.77 kg/m?  ?General:   Pleasant, well developed female in no acute distress ?Head:  Normocephalic and atraumatic. ?Eyes: sclerae anicteric,conjunctive pink  ?Heart:  regular rate and rhythm ?Pulm: Clear anteriorly; no wheezing ?Abdomen:  Soft, Obese AB, skin exam diastasis/ventral hernia, non-tender, non-distended ?Extremities:  Without edema. ?Msk:  Symmetrical without gross deformities. Peripheral pulses intact.  ?Neurologic:  Alert and  oriented x4;  grossly normal neurologically. ?Skin:   Dry and intact without significant lesions or rashes. ?Psychiatric: Demonstrates good judgement and reason without abnormal affect or behaviors. ? ? ?Imogene Burn, MD ?4:09 PM ? ? ?

## 2021-06-27 ENCOUNTER — Other Ambulatory Visit: Payer: Self-pay

## 2021-06-27 NOTE — Patient Outreach (Signed)
Medicaid Managed Care Social Work Note  06/27/2021 Name:  Christine Gordon MRN:  [id number] DOB:  04-Oct-1980  Christine Gordon is an 41 y.o. year old female who is a primary patient of Fayette Pho, MD.  The Medicaid Managed Care Coordination team was consulted for assistance with:  Community Resources   Christine Gordon was given information about Medicaid Managed Care Coordination team services today. Christine Gordon Patient agreed to services and verbal consent obtained.  Engaged with patient  for by telephone forfollow up visit in response to referral for case management and/or care coordination services.   Assessments/Interventions:  Review of past medical history, allergies, medications, health status, including review of consultants reports, laboratory and other test data, was performed as part of comprehensive evaluation and provision of chronic care management services.  SDOH: (Social Determinant of Health) assessments and interventions performed: BSW completed telephone outreach with patient. She stated she contacted [employer] to assist with household items but they do not assist with it. Patient asked for resources that provided church clothes, BSW infomred she can provide her with [employer] closet [phone]. Patient stated she was driving and could not write the number down but would call BSW back to get the phone number. No other resources needed at this time.  Advanced Directives Status:  Not addressed in this encounter.  Care Plan                 Allergies  Allergen Reactions   Nsaids Other (See Comments)    NSAIDs contraindicated due to cerebral aneurysm and fibromuscular dysplasia.   Triptans Other (See Comments)    Triptans are contraindicated due to cerebral aneurysm and fibromuscular dysplasia.     Medications Reviewed Today     Reviewed by Justice Britain, CMA (Certified Medical Assistant) on 06/25/21 at 1548  Med List Status: <None>   Medication Order Taking? Sig Documenting  Provider Last Dose Status Informant  albuterol (VENTOLIN HFA) 108 (90 Base) MCG/ACT inhaler [id number] Yes Inhale into the lungs every 6 (six) hours as needed. [provider] Taking Active   aspirin EC 81 MG tablet [id number] Yes Take 81 mg by mouth daily. Swallow whole. [provider] Taking Active   Capsaicin-Menthol-Methyl Sal 0.001-10-20 % LOTN [id number] Yes Apply to knee daily for pain relief. Fayette Pho, MD Taking Active   Cholecalciferol 125 MCG (5000 UT) TABS [id number] Yes Take by mouth. [provider] Taking Active   cyclobenzaprine (FLEXERIL) 5 MG tablet [id number] Yes Take 5 mg by mouth 3 (three) times daily as needed. [provider] Taking Active   docusate sodium (COLACE) 250 MG capsule [id number] Yes Take 1 capsule (250 mg total) by mouth daily. Fayette Pho, MD Taking Active   famotidine (PEPCID) 40 MG tablet [id number]  Take 1 tablet (40 mg total) by mouth at bedtime. Doree Albee, PA-C  Expired 05/23/21 2359   lidocaine (XYLOCAINE) 5 % ointment [id number] Yes Apply 1 application. topically 2 (two) times daily as needed. To back area that hurts Fayette Pho, MD Taking Active   linaclotide Karlene Einstein) 290 MCG CAPS capsule [id number] Yes Take 1 capsule (290 mcg total) by mouth daily before breakfast. Imogene Burn, MD Taking Active   lisinopril (ZESTRIL) 10 MG tablet [id number] Yes Take 1 tablet (10 mg total) by mouth at bedtime. Fayette Pho, MD Taking Active   medroxyPROGESTERone Acetate 150 MG/ML SUSY [id number] Yes Inject into the muscle. [provider] Taking Active   meloxicam (MOBIC) 15 MG tablet [id number] Yes Take  15 mg by mouth daily as needed. [provider] Taking Active   naproxen (NAPROSYN) 375 MG tablet [id number] Yes Take 1 tablet (375 mg total) by mouth 2 (two) times daily. Couture, Cortni S, PA-C Taking Active   rosuvastatin (CRESTOR) 10 MG tablet [id number] Yes Take 10 mg by mouth daily. [provider] Taking Active   senna (SENOKOT) 8.6 MG TABS tablet [id number] Yes Take 1 tablet by mouth daily. [provider] Taking Active   Spacer/Aero-Holding Chambers (EASIVENT) inhaler [id number] Yes Use as instructed with inhaler. [provider] Taking Active   Ubrogepant (UBRELVY) 100 MG TABS [id number] Yes Medication Samples have been provided to the patient.  Drug name: Bernita Raisin       Strength: 100mg         Qty: 4  LOT: [id number]  Exp.Date: 08/2022  Dosing instructions:   The patient has been instructed regarding the correct time, dose, and frequency of taking this medication, including desired effects and most common side effects.   [name] 11:35 AM 05/22/2021 Drema Dallas, DO Taking Active   verapamil (CALAN-SR) 120 MG CR tablet [id number] Yes Take 1 tablet (120 mg total) by mouth at bedtime. Fayette Pho, MD Taking Active   zonisamide [facility]) 100 MG capsule [id number] Yes Take 1 capsule (100 mg total) by mouth daily. Drema Dallas, DO Taking Active             Patient Active Problem List   Diagnosis Date Noted   Acute bilateral low back pain without sciatica 06/14/2021   Depressed mood 05/22/2021   Cervical spondylosis 04/04/2021   Chronic myofascial pain 04/04/2021   Chronic complaint of left-sided weakness 01/15/2021   Smoking    Hypertension 12/22/2020   Anxiety about dying 12/22/2020   Class 2 obesity due to excess calories with body mass index (BMI) of 36.0 to 36.9 in adult    Obstructive sleep apnea 12/21/2020   Fibromuscular dysplasia (HCC) 09/14/2020   Mixed hyperlipidemia 07/21/2020   Cerebral aneurysm without rupture 04/09/2019   History of prediabetes 04/09/2019   Migraine without aura and with status migrainosus, not intractable 04/09/2019   Contraception management 10/09/2017   Mild intermittent asthma without complication 01/20/2014    Conditions to be addressed/monitored per PCP order:   community resources  Care Plan :  LCSW Plan of Care  Updates made by Shaune Leeks since 06/27/2021 12:00 AM     Problem: Depression Identification (Depression)      Goal: To get established with a psychiatry   Start Date: 05/29/2021  Priority: High  Note:   Priority: High  Timeframe:  Long-Range Goal Priority:  High Start Date:   05/29/21             Expected End Date:  ongoing                     Follow Up Date--06/29/21   Current barriers:   Chronic Mental Health needs related to depression, stress and anxiety. Mental Health Needs related to Depression, requires Support, Education, Resources, Referrals, Advocacy, and Care Coordination, in order to meet Unmet Mental Health Needs. Mental Health Concerns, Lack of social support network, financial hardships and Social Isolation Patient lacks knowledge of available community counseling agencies and resources.  Clinical Goal(s): verbalize understanding of plan for management of Depression and Stress. To demonstrate a reduction in symptoms related to : Depression, connect with provider for ongoing mental health treatment.  , and increase coping skills,  healthy habits, self-management skills, and stress reduction           Clinical Interventions:  Assessed patient's previous and current treatment, coping skills, support system and barriers to care. Patient provided hx  Verbalization of feelings encouraged, motivational interviewing employed Emotional support provided, positive coping strategies explored Self care/establishing healthy boundaries emphasized Patient reports that she is experiencing ongoing depression and would like treatment. She was educated on available mental health resources nearby her area. She is only interested in psychiatry at this time and will consider counseling. Patient reports significant worsening depression impacting her ability to function daily. Patient endorsed that she has stable transportation  Patient has a lack of a social support  network. She lives with her son but her son is not a reliable source of support at this time.  Patient is agreeable to referral to [facility] for psychiatry. [facility] LCSW made referral on 05/29/21.  Patient will work on implementing appropriate self-care habits into their daily routine such as: drinking water, staying active around the house, taking their medications as prescribed, combating negative thoughts or emotions and staying connected with their family and friends. Positive reinforcement provided for this decision to work on this. 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 LCSW provided education on relaxation techniques such as meditation, deep breathing, massage, grounding exercises or yoga that can activate the body's relaxation response and ease symptoms of stress and anxiety. LCSW ask that when pt is struggling with difficult emotions and racing thoughts that they start this relaxation response process. LCSW provided extensive education on healthy coping skills for anxiety. SW used active and reflective listening, validated patient's feelings/concerns, and provided emotional support.  Motivational Interviewing employed Depression screen reviewed  PHQ2/ PHQ9 completed Mindfulness or Relaxation training provided Active listening / Reflection utilized  Advance Care and HCPOA education provided Emotional Support Provided Problem Solving /Task Center strategies reviewed Provided psychoeducation for mental health needs  Provided brief CBT  Reviewed mental health medications and discussed importance of compliance:  Quality of sleep assessed & Sleep Hygiene techniques promoted  Participation in counseling encouraged  Verbalization of feelings encouraged  [facility] BSW referral made on 05/29/21 for financial hardships. Per PCP, she has limited family or community support and is considering disability but experiencing financial issues due to medical conditions. Appointment was scheduled with patient  to speak with [facility] BSW on 06/07/21.  Suicidal Ideation/Homicidal Ideation assessed: Patient denies SI/HI  Review resources, discussed options and provided patient information about  Mental Health Resources Inter-disciplinary care team collaboration (see longitudinal plan of care) Patient Goals/Self-Care Activities: Over the next 120 days Attend scheduled medical appointments Utilize healthy coping skills and supportive resources discussed Contact PCP with any questions or concerns Keep 90 percent of appointments Call your insurance provider for more information about your Enhanced Benefits  Check out counseling resources provided  Begin personal counseling with LCSW, to reduce and manage symptoms of Depression until well-established with mental health provider Accept all calls from representative with [facility] in an effort to establish ongoing mental health counseling and supportive services. Incorporate into daily practice - relaxation techniques, deep breathing exercises, and mindfulness meditation strategies. Talk about feelings with friends, family members, spiritual advisor, etc.. Contact LCSW directly [phone]), if you have questions, need assistance, or if additional social work needs are identified between now and our next scheduled telephone outreach call. Call [phone] for mental health hotline/crisis line if needed (24/7 available) Try techniques to reduce symptoms of anxiety (deep breathing,  distraction, positive self talk, etc)  06/07/21: BSW completed telephone outreach with patient. She stated she was able to pay her rent this month. At this time she does not need assistance with rent or utilities, but does need assistance with household goods. BSW informed patient she can contact [employer] and referred patient to [facility]. [name] [phone]. No other resources are needed at this time.  06/27/21: BSW completed telephone outreach with patient. She stated she contacted [employer]  to assist with household items but they do not assist with it. Patient asked for resources that provided church clothes, BSW infomred she can provide her with [employer] [phone]. Patient stated she was driving and could not write the number down but would call BSW back to get the phone number. No other resources needed at this time.      Follow up:  Patient agrees to Care Plan and Follow-up.  Plan: The Managed Medicaid care management team will reach out to the patient again over the next 30 days.  Date/time of next scheduled Social Work care management/care coordination outreach:  07/27/21 [name], Kenard Gower, [facility]  [facility]  High Risk Managed Medicaid Team  [phone]

## 2021-06-27 NOTE — Patient Instructions (Signed)
Visit Information  Christine Gordon was given information about Medicaid Managed Care team care coordination services as a part of their Healthy [facility] Medicaid benefit. Christine Gordon verbally consented to engagement with the [facility] Managed Care team.   If you are experiencing a medical emergency, please call 911 or report to your local emergency department or urgent care.   If you have a non-emergency medical problem during routine business hours, please contact your provider's office and ask to speak with a nurse.   For questions related to your Healthy [facility] health plan, please call: [phone] or visit the homepage here: [email or web address]  If you would like to schedule transportation through your Healthy [facility] plan, please call the following number at least 2 days in advance of your appointment: [phone]  For information about your ride after you set it up, call Ride Assist at [phone]. Use this number to activate a Will Call pickup, or if your transportation is late for a scheduled pickup. Use this number, too, if you need to make a change or cancel a previously scheduled reservation.  If you need transportation services right away, call [phone]. The after-hours call center is staffed 24 hours to handle ride assistance and urgent reservation requests (including discharges) 365 days a year. Urgent trips include sick visits, hospital discharge requests and life-sustaining treatment.  Call the [facility] Line at [phone], at any time, 24 hours a day, 7 days a week. If you are in danger or need immediate medical attention call 911.  If you would like help to quit smoking, call 1-800-QUIT-NOW ((845) 111-2067) OR Espaol: 1-855-Djelo-Ya (8-756-433-2951) o para ms informacin haga clic aqu or Text READY to 884-166 to register via text  Ms. Konopka - following are the goals we discussed in your visit today:   Goals  Addressed   None       Social Worker will follow up in 30 days .  [name], BSW, [facility]  [place]  High Risk Managed Medicaid Team  [phone]   Following is a copy of your plan of care:  Care Plan : LCSW Plan of Care  Updates made by Shaune Leeks since 06/27/2021 12:00 AM     Problem: Depression Identification (Depression)      Goal: To get established with a psychiatry   Start Date: 05/29/2021  Priority: High  Note:   Priority: High  Timeframe:  Long-Range Goal Priority:  High Start Date:   05/29/21             Expected End Date:  ongoing                     Follow Up Date--06/29/21   Current barriers:   Chronic Mental Health needs related to depression, stress and anxiety. Mental Health Needs related to Depression, requires Support, Education, Resources, Referrals, Advocacy, and Care Coordination, in order to meet Unmet Mental Health Needs. Mental Health Concerns, Lack of social support network, financial hardships and Social Isolation Patient lacks knowledge of available community counseling agencies and resources.  Clinical Goal(s): verbalize understanding of plan for management of Depression and Stress. To demonstrate a reduction in symptoms related to : Depression, connect with provider for ongoing mental health treatment.  , and increase coping skills, healthy habits, self-management skills, and stress reduction           Clinical Interventions:  Assessed patient's previous and current treatment, coping skills, support system and barriers to care.  Patient provided hx  Verbalization of feelings encouraged, motivational interviewing employed Emotional support provided, positive coping strategies explored Self care/establishing healthy boundaries emphasized Patient reports that she is experiencing ongoing depression and would like treatment. She was educated on available mental health resources nearby her area. She is only  interested in psychiatry at this time and will consider counseling. Patient reports significant worsening depression impacting her ability to function daily. Patient endorsed that she has stable transportation  Patient has a lack of a social support network. She lives with her son but her son is not a reliable source of support at this time.  Patient is agreeable to referral to [facility] for psychiatry. [facility] LCSW made referral on 05/29/21.  Patient will work on implementing appropriate self-care habits into their daily routine such as: drinking water, staying active around the house, taking their medications as prescribed, combating negative thoughts or emotions and staying connected with their family and friends. Positive reinforcement provided for this decision to work on this. 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 LCSW provided education on relaxation techniques such as meditation, deep breathing, massage, grounding exercises or yoga that can activate the body's relaxation response and ease symptoms of stress and anxiety. LCSW ask that when pt is struggling with difficult emotions and racing thoughts that they start this relaxation response process. LCSW provided extensive education on healthy coping skills for anxiety. SW used active and reflective listening, validated patient's feelings/concerns, and provided emotional support.  Motivational Interviewing employed Depression screen reviewed  PHQ2/ PHQ9 completed Mindfulness or Relaxation training provided Active listening / Reflection utilized  Advance Care and HCPOA education provided Emotional Support Provided Problem Solving /Task Center strategies reviewed Provided psychoeducation for mental health needs  Provided brief CBT  Reviewed mental health medications and discussed importance of compliance:  Quality of sleep assessed & Sleep Hygiene techniques promoted  Participation in counseling encouraged  Verbalization of feelings  encouraged  [facility] BSW referral made on 05/29/21 for financial hardships. Per PCP, she has limited family or community support and is considering disability but experiencing financial issues due to medical conditions. Appointment was scheduled with patient to speak with [facility] BSW on 06/07/21.  Suicidal Ideation/Homicidal Ideation assessed: Patient denies SI/HI  Review resources, discussed options and provided patient information about  Mental Health Resources Inter-disciplinary care team collaboration (see longitudinal plan of care) Patient Goals/Self-Care Activities: Over the next 120 days Attend scheduled medical appointments Utilize healthy coping skills and supportive resources discussed Contact PCP with any questions or concerns Keep 90 percent of appointments Call your insurance provider for more information about your Enhanced Benefits  Check out counseling resources provided  Begin personal counseling with LCSW, to reduce and manage symptoms of Depression until well-established with mental health provider Accept all calls from representative with [facility] in an effort to establish ongoing mental health counseling and supportive services. Incorporate into daily practice - relaxation techniques, deep breathing exercises, and mindfulness meditation strategies. Talk about feelings with friends, family members, spiritual advisor, etc.. Contact LCSW directly [phone]), if you have questions, need assistance, or if additional social work needs are identified between now and our next scheduled telephone outreach call. Call 988 for mental health hotline/crisis line if needed (24/7 available) Try techniques to reduce symptoms of anxiety (deep breathing, distraction, positive self talk, etc)  06/07/21: BSW completed telephone outreach with patient. She stated she was able to pay her rent this month. At this time she does not need assistance with rent  or utilities, but does need assistance with household  goods. BSW informed patient she can contact [employer] and referred patient to [facility]. [name] [phone]. No other resources are needed at this time.  06/27/21: BSW completed telephone outreach with patient. She stated she contacted [employer] to assist with household items but they do not assist with it. Patient asked for resources that provided church clothes, BSW infomred she can provide her with [employer] closet [phone]. Patient stated she was driving and could not write the number down but would call BSW back to get the phone number. No other resources needed at this time.

## 2021-06-29 ENCOUNTER — Other Ambulatory Visit: Payer: Self-pay | Admitting: Licensed Clinical Social Worker

## 2021-06-29 NOTE — Patient Instructions (Signed)
Visit Information ? ?Christine Gordon was given information about Medicaid Managed Care team care coordination services as a part of their Healthy [facility] Medicaid benefit. Christine Gordon verbally consented to engagement with the [facility] Managed Care team.  ? ?If you are experiencing a medical emergency, please call 911 or report to your local emergency department or urgent care.  ? ?If you have a non-emergency medical problem during routine business hours, please contact your provider's office and ask to speak with a nurse.  ? ?For questions related to your Healthy [facility] health plan, please call: [phone] or visit the homepage here: [email or web address] ? ?If you would like to schedule transportation through your Healthy [facility] plan, please call the following number at least 2 days in advance of your appointment: [phone] ? For information about your ride after you set it up, call Ride Assist at [phone]. Use this number to activate a Will Call pickup, or if your transportation is late for a scheduled pickup. Use this number, too, if you need to make a change or cancel a previously scheduled reservation. ? If you need transportation services right away, call [phone]. The after-hours call center is staffed 24 hours to handle ride assistance and urgent reservation requests (including discharges) 365 days a year. Urgent trips include sick visits, hospital discharge requests and life-sustaining treatment. ? ?Call the [facility] Line at [phone], at any time, 24 hours a day, 7 days a week. If you are in danger or need immediate medical attention call 911. ? ?If you would like help to quit smoking, call 1-800-QUIT-NOW ((681)428-6470) OR Espa?ol: 1-855-D?jelo-Ya 217-328-8805) o para m?s informaci?n haga clic aqu? or Text READY to 200-400 to register via text ? ?The Managed Medicaid care management team will reach out to the patient again over  the next 90 days.  ? ? ?Following is a copy of your plan of care:  ?Care Plan : LCSW Plan of Care  ?Updates made by Christine Bryant, LCSW since 06/29/2021 12:00 AM  ?  ? ?Problem: Depression Identification (Depression)   ?  ? ?Goal: To get established with a psychiatry   ?Start Date: 05/29/2021  ?Priority: High  ?Note:   ?Priority: High ? ?Timeframe:  Long-Range Goal ?Priority:  High ?Start Date:   05/29/21             ?Expected End Date:  ongoing                   ?  ?Follow Up Date--08/15/21 ? ? Current barriers:   ?Chronic Mental Health needs related to depression, stress and anxiety. Mental Health Needs related to Depression, requires Support, Education, Resources, Referrals, Advocacy, and Care Coordination, in order to meet Unmet Mental Health Needs. ?Mental Health Concerns, Lack of social support network, financial hardships and Social Isolation ?Patient lacks knowledge of available community counseling agencies and resources. ? ?Clinical Goal(s): verbalize understanding of plan for management of Depression and Stress. To demonstrate a reduction in symptoms related to : Depression, connect with provider for ongoing mental health treatment.  , and increase coping skills, healthy habits, self-management skills, and stress reduction     ?  ?Patient Goals/Self-Care Activities: Over the next 120 days ?Attend scheduled medical appointments ?Utilize healthy coping skills and supportive resources discussed ?Contact PCP with any questions or concerns ?Keep 90 percent of appointments ?Call your insurance provider for more information about your Enhanced Benefits  ?Check out counseling resources provided  ?Begin personal counseling with LCSW, to  reduce and manage symptoms of Depression until well-established with mental health provider ?Accept all calls from representative with [facility] in an effort to establish ongoing mental health counseling and supportive services. ?Incorporate into daily practice - relaxation techniques,  deep breathing exercises, and mindfulness meditation strategies. ?Talk about feelings with friends, family members, spiritual advisor, etc.. ?Contact LCSW directly [phone]), if you have questions, need assistance, or if additional social work needs are identified between now and our next scheduled telephone outreach call. ?Call 988 for mental health hotline/crisis line if needed (24/7 available) ?Try techniques to reduce symptoms of anxiety (deep breathing, distraction, positive self talk, etc)  ?06/07/21: BSW completed telephone outreach with patient. She stated she was able to pay her rent this month. At this time she does not need assistance with rent or utilities, but does need assistance with household goods. BSW informed patient she can contact [employer] and referred patient to [facility]. [name] [phone]. No other resources are needed at this time.  ?06/27/21: BSW completed telephone outreach with patient. She stated she contacted [employer] to assist with household items but they do not assist with it. Patient asked for resources that provided church clothes, BSW infomred she can provide her with [employer] closet [phone]. Patient stated she was driving and could not write the number down but would call BSW back to get the phone number. No other resources needed at this time.  ? ? ?Dickie La, BSW, MSW, LCSW ?Managed Medicaid LCSW ?[facility] ?[email or web address] ?Phone: [phone] ? ?  ? ?  ?

## 2021-06-29 NOTE — Patient Outreach (Signed)
Medicaid Managed Care Social Work Note  06/29/2021 Name:  Christine Gordon MRN:  [id number] DOB:  December 26, 1980  Christine Gordon is an 41 y.o. year old female who is a primary Gordon of Christine Gordon.  The Medicaid Managed Care Coordination team was consulted for assistance with:  Mental Health Counseling and Resources  Christine Gordon was given information about Medicaid Managed Care Coordination team services today. Christine Gordon agreed to services and verbal consent obtained.  Engaged with Gordon  for by telephone forfollow up visit in response to referral for case management and/or care coordination services.   Assessments/Interventions:  Review of past medical history, allergies, medications, health status, including review of consultants reports, laboratory and other test data, was performed as part of comprehensive evaluation and provision of chronic care management services.  SDOH: (Social Determinant of Health) assessments and interventions performed: SDOH Interventions    Flowsheet Row Most Recent Value  SDOH Interventions   Stress Interventions Offered [employer], Provide Counseling       Advanced Directives Status:  See Care Plan for related entries.  Care Plan                 Allergies  Allergen Reactions   Nsaids Other (See Comments)    NSAIDs contraindicated due to cerebral aneurysm and fibromuscular dysplasia.   Triptans Other (See Comments)    Triptans are contraindicated due to cerebral aneurysm and fibromuscular dysplasia.     Medications Reviewed Today     Reviewed by Christine Gordon (Certified Medical Assistant) on 06/25/21 at 1548  Med List Status: <None>   Medication Order Taking? Sig Documenting Provider Last Dose Status Informant  albuterol (VENTOLIN HFA) 108 (90 Base) MCG/ACT inhaler [id number] Yes Inhale into the lungs every 6 (six) hours as needed. Provider, Historical, Gordon Taking Active   aspirin EC 81 MG tablet [id number] Yes  Take 81 mg by mouth daily. Swallow whole. Provider, Historical, Gordon Taking Active   Capsaicin-Menthol-Methyl Sal 0.001-10-20 % LOTN [id number] Yes Apply to knee daily for pain relief. Christine Gordon Taking Active   Cholecalciferol 125 MCG (5000 UT) TABS [id number] Yes Take by mouth. Provider, Historical, Gordon Taking Active   cyclobenzaprine (FLEXERIL) 5 MG tablet [id number] Yes Take 5 mg by mouth 3 (three) times daily as needed. Provider, Historical, Gordon Taking Active   docusate sodium (COLACE) 250 MG capsule [id number] Yes Take 1 capsule (250 mg total) by mouth daily. Christine Gordon Taking Active   famotidine (PEPCID) 40 MG tablet [id number]  Take 1 tablet (40 mg total) by mouth at bedtime. Doree Albee, PA-C  Expired 05/23/21 2359   lidocaine (XYLOCAINE) 5 % ointment [id number] Yes Apply 1 application. topically 2 (two) times daily as needed. To back area that hurts Christine Gordon Taking Active   linaclotide Karlene Einstein) 290 MCG CAPS capsule [id number] Yes Take 1 capsule (290 mcg total) by mouth daily before breakfast. Imogene Burn, Gordon Taking Active   lisinopril (ZESTRIL) 10 MG tablet [id number] Yes Take 1 tablet (10 mg total) by mouth at bedtime. Christine Gordon Taking Active   medroxyPROGESTERone Acetate 150 MG/ML SUSY [id number] Yes Inject into the muscle. Provider, Historical, Gordon Taking Active   meloxicam (MOBIC) 15 MG tablet [id number] Yes Take 15 mg by mouth daily as needed. Provider, Historical, Gordon Taking Active   naproxen (NAPROSYN) 375 MG tablet [id number] Yes Take 1 tablet (375 mg total) by mouth 2 (two) times daily. Couture, Cortni S, PA-C Taking Active  rosuvastatin (CRESTOR) 10 MG tablet [id number] Yes Take 10 mg by mouth daily. Provider, Historical, Gordon Taking Active   senna (SENOKOT) 8.6 MG TABS tablet [id number] Yes Take 1 tablet by mouth daily. Provider, Historical, Gordon Taking Active   Spacer/Aero-Holding Chambers (EASIVENT) inhaler [id number] Yes Use as instructed with inhaler.  Provider, Historical, Gordon Taking Active   Ubrogepant (UBRELVY) 100 MG TABS [id number] Yes Medication Samples have been provided to the Gordon.  Drug name: Bernita Raisin       Strength: 100mg         Qty: 4  LOT: [id number]  Exp.Date: 08/2022  Dosing instructions:   The Gordon has been instructed regarding the correct time, dose, and frequency of taking this medication, including desired effects and most common side effects.   Christine Gordon 11:35 AM 05/22/2021 Christine Gordon Taking Active   verapamil (CALAN-SR) 120 MG CR tablet [id number] Yes Take 1 tablet (120 mg total) by mouth at bedtime. Christine Gordon Taking Active   zonisamide [facility]) 100 MG capsule [id number] Yes Take 1 capsule (100 mg total) by mouth daily. Christine Gordon Taking Active             Gordon Active Problem List   Diagnosis Date Noted   Acute bilateral low back pain without sciatica 06/14/2021   Depressed mood 05/22/2021   Cervical spondylosis 04/04/2021   Chronic myofascial pain 04/04/2021   Chronic complaint of left-sided weakness 01/15/2021   Smoking    Hypertension 12/22/2020   Anxiety about dying 12/22/2020   Class 2 obesity due to excess calories with body mass index (BMI) of 36.0 to 36.9 in adult    Obstructive sleep apnea 12/21/2020   Fibromuscular dysplasia (HCC) 09/14/2020   Mixed hyperlipidemia 07/21/2020   Cerebral aneurysm without rupture 04/09/2019   History of prediabetes 04/09/2019   Migraine without aura and with status migrainosus, not intractable 04/09/2019   Contraception management 10/09/2017   Mild intermittent asthma without complication 01/20/2014    Conditions to be addressed/monitored per PCP order:  Anxiety and Depression  Care Plan : LCSW Plan of Care  Updates made by Christine Bryant, LCSW since 06/29/2021 12:00 AM     Problem: Depression Identification (Depression)      Goal: To get established with a psychiatry   Start Date: 05/29/2021  Priority: High  Note:    Priority: High  Timeframe:  Long-Range Goal Priority:  High Start Date:   05/29/21             Expected End Date:  ongoing                     Follow Up Date--08/15/21   Current barriers:   Chronic Mental Health needs related to depression, stress and anxiety. Mental Health Needs related to Depression, requires Support, Education, Resources, Referrals, Advocacy, and Care Coordination, in order to meet Unmet Mental Health Needs. Mental Health Concerns, Lack of social support network, financial hardships and Social Isolation Gordon lacks knowledge of available community counseling agencies and resources.  Clinical Goal(s): verbalize understanding of plan for management of Depression and Stress. To demonstrate a reduction in symptoms related to : Depression, connect with provider for ongoing mental health treatment.  , and increase coping skills, healthy habits, self-management skills, and stress reduction           Clinical Interventions:  Assessed Gordon's previous and current treatment, coping skills, support system and barriers to care. Gordon provided hx  Verbalization of  feelings encouraged, motivational interviewing employed Emotional support provided, positive coping strategies explored Self care/establishing healthy boundaries emphasized Gordon reports that she is experiencing ongoing depression and would like treatment. She was educated on available mental health resources nearby her area. She is only interested in psychiatry at this time and will consider counseling. Gordon reports significant worsening depression impacting her ability to function daily. Gordon endorsed that she has stable transportation  Gordon has a lack of a social support network. She lives with her son but her son is not a reliable source of support at this time.  Gordon is agreeable to referral to [facility] for psychiat[facility]. [facility] LCSW made referral on 05/29/21.  Gordon will work on implementing appropriate  self-care habits into their daily routine such as: drinking water, staying active around the house, taking their medications as prescribed, combating negative thoughts or emotions and staying connected with their family and friends. Positive reinforcement provided for this decision to work on this. Gordon has "anxiety over dying" as a dx in her chart. LCSW provided education on relaxation techniques such as meditation, deep breathing, massage, grounding exercises or yoga that can activate the body's relaxation response and ease symptoms of stress and anxiety. LCSW ask that when pt is struggling with difficult emotions and racing thoughts that they start this relaxation response process. LCSW provided extensive education on healthy coping skills for anxiety. SW used active and reflective listening, validated Gordon's feelings/concerns, and provided emotional support.  Motivational Interviewing employed Depression screen reviewed  PHQ2/ PHQ9 completed Mindfulness or Relaxation training provided Active listening / Reflection utilized  Advance Care and HCPOA education provided Emotional Support Provided Problem Solving /Task Center strategies reviewed Provided psychoeducation for mental health needs  Provided brief CBT  Reviewed mental health medications and discussed importance of compliance:  Quality of sleep assessed & Sleep Hygiene techniques promoted  Participation in counseling encouraged  Verbalization of feelings encouraged  [facility] BSW referral made on 05/29/21 for financial hardships. Per PCP, she has limited family or community support and is considering disability but experiencing financial issues due to medical conditions. Appointment was scheduled with Gordon to speak with [facility] BSW on 06/07/21.  Suicidal Ideation/Homicidal Ideation assessed: Gordon denies SI/HI  Review resources, discussed options and provided Gordon information about  Mental Health Resources Inter-disciplinary care team  collaboration (see longitudinal plan of care) UPDATE- Gordon had a counseling appointment scheduled with [facility] but no psychiatry appointment. [facility] contacted Christine Gordon and successfully scheduled psychiatry appointment. Gordon was briefly educated on healthy coping skills to implement into her daily routine to reduce her anxiety and depression. She was receptive to this education. The following information was sent to Gordon by email for her review:  [facility]-Contact person Christine Gordon [phone] or [phone] or come in as a walk-in Monday-Wednesday 8am-11am or Friday 1pm-4pm. Accepts Medicaid ([place] or Managed) and Uninsured (Self-Pay) patients Walk in hours Monday, Tuesday, Wednesday and Friday. Medication Management 8:00 AM to 11:00 AM (Encourage Gordon to arrive by 7:30 AM). Therapy 8:00 AM to 3:00 PM (Encourage Gordon to arrive by 7:30 AM) Appointments are conducted in-person and/or tele Internal referrals last for 3 months only Gordon can go to the 2nd floor outpatient program of [facility] as a walk in client at 7:00 am for psychiatry on Mondays, Wednesdays and Fridays. Gordon can go in as a walk in Gordon for therapy on Mondays and Wednesdays at 7:30 am. Usually there is a line of 10 or more people already there by  7:30 am. You will be able to see a provider that very day. Your counseling/therapy appointment is on 07/18/21 at 1:00 with Christine Gordon Your psychiatry appointment is on 08/16/21 at 1:30 Christine Gordon   Gordon Goals/Self-Care Activities: Over the next 120 days Attend scheduled medical appointments Utilize healthy coping skills and supportive resources discussed Contact PCP with any questions or concerns Keep 90 percent of appointments Call your insurance provider for more information about your Enhanced Benefits  Check out counseling resources provided  Begin personal counseling with LCSW, to reduce and manage symptoms of Depression until  well-established with mental health provider Accept all calls from representative with [facility] Gordon in an effort to establish ongoing mental health counseling and supportive services. Incorporate into daily practice - relaxation techniques, deep breathing exercises, and mindfulness meditation strategies. Talk about feelings with friends, family members, spiritual advisor, etc.. Contact LCSW directly [phone]), if you have questions, need assistance, or if additional social work needs are identified between now and our next scheduled telephone outreach call. Call [phone] for mental health hotline/crisis line if needed (24/7 available) Try techniques to reduce symptoms of anxiety (deep breathing, distraction, positive self talk, etc)  06/07/21: BSW completed telephone outreach with Gordon. She stated she was able to pay her rent this month. At this time she does not need assistance with rent or utilities, but does need assistance with household goods. BSW informed Gordon she can contact [employer] and referred Gordon to [facility]. Christine Gordon [phone]. No other resources are needed at this time.  06/27/21: BSW completed telephone outreach with Gordon. She stated she contacted [employer] to assist with household items but they Gordon not assist with it. Gordon asked for resources that provided church clothes, BSW infomred she can provide her with [employer] [phone]. Gordon stated she was driving and could not write the number down but would call BSW back to get the phone number. No other resources needed at this time.      Follow up:  Gordon agrees to Care Plan and Follow-up.  Plan: The Managed Medicaid care management team will reach out to the Gordon again over the next 90 days.  Date/time of next scheduled Social Work care management/care coordination outreach:  08/15/21 at 1:00 pm.   Christine Gordon, BSW, MSW, LCSW Managed Medicaid LCSW [facility]  [facility] [email or web address] Phone: [phone]

## 2021-07-18 ENCOUNTER — Ambulatory Visit (INDEPENDENT_AMBULATORY_CARE_PROVIDER_SITE_OTHER): Payer: Medicaid Other | Admitting: Licensed Clinical Social Worker

## 2021-07-18 DIAGNOSIS — F333 Major depressive disorder, recurrent, severe with psychotic symptoms: Secondary | ICD-10-CM | POA: Insufficient documentation
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
## 2021-07-27 ENCOUNTER — Other Ambulatory Visit: Payer: Self-pay

## 2021-07-27 NOTE — Patient Outreach (Signed)
Care Coordination  07/27/2021  Zyiah Withington 1980-09-30 [id number]   Medicaid Managed Care   Unsuccessful Outreach Note  07/27/2021 Name: Glen Blatchley MRN: [id number] DOB: [birth year]  Referred by: Fayette Pho, MD Reason for referral : High Risk Managed Medicaid (MM Social Work [employer])   An unsuccessful telephone outreach was attempted today. The patient was referred to the case management team for assistance with care management and care coordination.   Follow Up Plan: A HIPAA compliant phone message was left for the patient providing contact information and requesting a return call.   [name], BSW, [place] [facility]  [place]  High Risk Managed Medicaid Team  [phone]

## 2021-07-27 NOTE — Patient Instructions (Signed)
Visit Information  Ms. [facility]  - as a part of your Medicaid benefit, you are eligible for care management and care coordination services at no cost or copay. I was unable to reach you by phone today but would be happy to help you with your health related needs. Please feel free to call me @ [phone]    [name], BSW, [facility]  [facility]  High Risk Managed Medicaid Team  [phone]

## 2021-07-31 ENCOUNTER — Telehealth (HOSPITAL_COMMUNITY): Payer: Self-pay | Admitting: Pharmacy Technician

## 2021-07-31 NOTE — Telephone Encounter (Signed)
Patient Advocate Encounter   Received notification that prior authorization for Ubrelvy 100MG  tablets is required.   PA submitted on 07/31/2021 Key [id number] Status is pending       08/02/2021, CPhT Pharmacy Patient Advocate Specialist [facility] Patient Advocate Team Direct Number: [phone]  Fax: [phone]

## 2021-07-31 NOTE — Telephone Encounter (Signed)
Patient Advocate Encounter  Prior Authorization for Bernita Raisin 100MG  tablets has been approved.    PA# Effective dates: 07/31/2021 through 07/31/2022      08/02/2022, CPhT Pharmacy Patient Advocate Specialist [facility] Patient Advocate Team Direct Number: [phone]  Fax: [phone]

## 2021-08-07 ENCOUNTER — Encounter: Payer: Self-pay | Admitting: *Deleted

## 2021-08-15 ENCOUNTER — Other Ambulatory Visit: Payer: Self-pay | Admitting: Licensed Clinical Social Worker

## 2021-08-15 NOTE — Patient Instructions (Signed)
Visit Information  Ms. Cooks was given information about Medicaid Managed Care team care coordination services as a part of their Healthy [facility] Medicaid benefit. Holy [place]field verbally consented to engagement with the [facility] Managed Care team.   If you are experiencing a medical emergency, please call 911 or report to your local emergency department or urgent care.   If you have a non-emergency medical problem during routine business hours, please contact your provider's office and ask to speak with a nurse.   For questions related to your Healthy [facility] health plan, please call: [phone] or visit the homepage here: [email or web address]  If you would like to schedule transportation through your Healthy [facility] plan, please call the following number at least 2 days in advance of your appointment: [phone]  For information about your ride after you set it up, call Ride Assist at [phone]. Use this number to activate a Will Call pickup, or if your transportation is late for a scheduled pickup. Use this number, too, if you need to make a change or cancel a previously scheduled reservation.  If you need transportation services right away, call [phone]. The after-hours call center is staffed 24 hours to handle ride assistance and urgent reservation requests (including discharges) 365 days a year. Urgent trips include sick visits, hospital discharge requests and life-sustaining treatment.  Call the [place] at [phone], at any time, 24 hours a day, 7 days a week. If you are in danger or need immediate medical attention call 911.  If you would like help to quit smoking, call 1-800-QUIT-NOW 234-707-2603) OR Espaol: 1-855-Djelo-Ya (4-742-595-6387) o para ms informacin haga clic aqu or Text READY to 200-400 to register via text   Following is a copy of your plan of care:  Care Plan : LCSW Plan of Care   Updates made by Greg Cutter, LCSW since 08/15/2021 12:00 AM     Problem: Depression Identification (Depression)      Goal: To get established with a psychiatry   Start Date: 05/29/2021  Priority: High  Note:   Priority: High  Timeframe:  Long-Range Goal Priority:  High Start Date:   05/29/21             Expected End Date:  ongoing                     Follow Up Date--Goal has been met and is now closed. Patient is now successfully established with psychiatry and counseling at [facility].    Current barriers:   Chronic Mental Health needs related to depression, stress and anxiety. Mental Health Needs related to Depression, requires Support, Education, Resources, Referrals, Advocacy, and Care Coordination, in order to meet Unmet Mental Health Needs. Mental Health Concerns, Lack of social support network, financial hardships and Social Isolation Patient lacks knowledge of available community counseling agencies and resources.  Clinical Goal(s): verbalize understanding of plan for management of Depression and Stress. To demonstrate a reduction in symptoms related to : Depression, connect with provider for ongoing mental health treatment.  , and increase coping skills, healthy habits, self-management skills, and stress reduction        Patient Goals/Self-Care Activities: Over the next 120 days Attend scheduled medical appointments Utilize healthy coping skills and supportive resources discussed Contact PCP with any questions or concerns Keep 90 percent of appointments Call your insurance provider for more information about your Enhanced Benefits  Check out counseling resources provided  Begin personal counseling with LCSW, to  reduce and manage symptoms of Depression until well-established with mental health provider Accept all calls from representative with [facility] in an effort to establish ongoing mental health counseling and supportive services. Incorporate into daily practice - relaxation  techniques, deep breathing exercises, and mindfulness meditation strategies. Talk about feelings with friends, family members, spiritual advisor, etc.. Contact LCSW directly [phone]), if you have questions, need assistance, or if additional social work needs are identified between now and our next scheduled telephone outreach call. Call [phone] for mental health hotline/crisis line if needed (24/7 available) Try techniques to reduce symptoms of anxiety (deep breathing, distraction, positive self talk, etc)  06/07/21: BSW completed telephone outreach with patient. She stated she was able to pay her rent this month. At this time she does not need assistance with rent or utilities, but does need assistance with household goods. BSW informed patient she can contact [employer] and referred patient to [place] [phone]. No other resources are needed at this time.  06/27/21: BSW completed telephone outreach with patient. She stated she contacted [employer] to assist with household items but they do not assist with it. Patient asked for resources that provided church clothes, BSW infomred she can provide her with [employer] [phone]. Patient stated she was driving and could not write the number down but would call BSW back to get the phone number. No other resources needed at this time.   Eula Fried, BSW, MSW, [employer] Managed Medicaid LCSW [email or web address] Phone: [phone]

## 2021-08-15 NOTE — Patient Outreach (Signed)
Medicaid Managed Care Social Work Note  08/15/2021 Name:  Christine Gordon MRN:  [id number] DOB:  [birth year]  Christine Gordon is an 41 y.o. year old female who is a primary patient of Ezequiel Essex, MD.  The Medicaid Managed Care Coordination team was consulted for assistance with:  [place] and Resources  Ms. Hefner was given information about Medicaid Managed Care Coordination team services today. Bertis Ruddy Patient agreed to services and verbal consent obtained.  Engaged with patient  for by telephone forfollow up visit in response to referral for case management and/or care coordination services.   Assessments/Interventions:  Review of past medical history, allergies, medications, health status, including review of consultants reports, laboratory and other test data, was performed as part of comprehensive evaluation and provision of chronic care management services.  SDOH: (Social Determinant of Health) assessments and interventions performed: SDOH Interventions    Flowsheet Row Most Recent Value  SDOH Interventions   Stress Interventions Offered [employer], Live Life Well       Advanced Directives Status:  See Care Plan for related entries.  Care Plan                 Allergies  Allergen Reactions   Nsaids Other (See Comments)    NSAIDs contraindicated due to cerebral aneurysm and fibromuscular dysplasia.   Triptans Other (See Comments)    Triptans are contraindicated due to cerebral aneurysm and fibromuscular dysplasia.     Medications Reviewed Today     Reviewed by Greg Cutter, LCSW (Social Worker) on 08/15/21 at [place] List Status: <None>   Medication Order Taking? Sig Documenting Provider Last Dose Status Informant  albuterol (VENTOLIN HFA) 108 (90 Base) MCG/ACT inhaler [id number] No Inhale into the lungs every 6 (six) hours as needed. [provider] Taking Active   aspirin EC 81 MG tablet [id number] No Take 81 mg by  mouth daily. Swallow whole. [provider] Taking Active   Capsaicin-Menthol-Methyl Sal 0.001-10-20 % LOTN [id number] No Apply to knee daily for pain relief. Ezequiel Essex, MD Taking Active   Cholecalciferol 125 MCG (5000 UT) TABS [id number] No Take by mouth. [provider] Taking Active   cyclobenzaprine (FLEXERIL) 5 MG tablet [id number] No Take 5 mg by mouth 3 (three) times daily as needed. [provider] Taking Active   docusate sodium (COLACE) 250 MG capsule [id number] No Take 1 capsule (250 mg total) by mouth daily. Ezequiel Essex, MD Taking Active   famotidine (PEPCID) 40 MG tablet [id number] No Take 1 tablet (40 mg total) by mouth at bedtime. Vladimir Crofts, PA-C Taking Expired 05/23/21 2359   lidocaine (XYLOCAINE) 5 % ointment [id number] No Apply 1 application. topically 2 (two) times daily as needed. To back area that hurts Ezequiel Essex, MD Taking Active   linaclotide Rolan Lipa) 290 MCG CAPS capsule [id number]  Take 1 capsule (290 mcg total) by mouth daily before breakfast. Sharyn Creamer, MD  Active   lisinopril (ZESTRIL) 10 MG tablet [id number] No Take 1 tablet (10 mg total) by mouth at bedtime. Ezequiel Essex, MD Taking Active   medroxyPROGESTERone Acetate 150 MG/ML SUSY [id number] No Inject into the muscle. [provider] Taking Active   meloxicam (MOBIC) 15 MG tablet [id number] No Take 15 mg by mouth daily as needed. [provider] Taking Active   naproxen (NAPROSYN) 375 MG tablet [id number] No Take 1 tablet (375 mg total) by mouth 2 (two) times daily. Couture, Cortni S, PA-C Taking Active  rosuvastatin (CRESTOR) 10 MG tablet [id number] No Take 10 mg by mouth daily. [provider] Taking Active   senna (SENOKOT) 8.6 MG TABS tablet [id number] No Take 1 tablet by mouth daily. [provider] Taking Active   Spacer/Aero-Holding Chambers (EASIVENT) inhaler [id number] No Use as instructed with inhaler. [provider]  Taking Active   Ubrogepant (UBRELVY) 100 MG TABS [id number] No Medication Samples have been provided to the patient.  Drug name: [name]       Strength: 120m        Qty: 4  LOT:: [id number] Exp.Date: 08/2022  Dosing instructions:   The patient has been instructed regarding the correct time, dose, and frequency of taking this medication, including desired effects and most common side effects.   [name] 11:35 AM 05/22/2021 JPieter Partridge DO Taking Active   verapamil (CALAN-SR) 120 MG CR tablet [id number] Take 1 tablet (120 mg total) by mouth at bedtime. LEzequiel Essex MD Taking Active   zonisamide ([facility] 100 MG capsule [id number] Take 1 capsule (100 mg total) by mouth daily. JPieter Partridge DO Taking Active             Patient Active Problem List   Diagnosis Date Noted   MDD (major depressive disorder), recurrent, severe, with psychosis ([place] 07/18/2021   Acute bilateral low back pain without sciatica 06/14/2021   Depressed mood 05/22/2021   Cervical spondylosis 04/04/2021   Chronic myofascial pain 04/04/2021   Chronic complaint of left-sided weakness 01/15/2021   Smoking    Hypertension 12/22/2020   Anxiety about dying 12/22/2020   Class 2 obesity due to excess calories with body mass index (BMI) of 36.0 to 36.9 in adult    Obstructive sleep apnea 12/21/2020   Fibromuscular dysplasia ([place] 09/14/2020   Mixed hyperlipidemia 07/21/2020   Cerebral aneurysm without rupture 04/09/2019   History of prediabetes 04/09/2019   Migraine without aura and with status migrainosus, not intractable 04/09/2019   Contraception management 10/09/2017   Mild intermittent asthma without complication 154/49/2010   Conditions to be addressed/monitored per PCP order:  Depression and Bipolar Disorder  Care Plan : LCSW Plan of Care  Updates made by JGreg Cutter LCSW since 08/15/2021 12:00 AM     Problem: Depression Identification (Depression)      Goal: To get established with  a psychiatry   Start Date: 05/29/2021  Priority: High  Note:   Priority: High  Timeframe:  Long-Range Goal Priority:  High Start Date:   05/29/21             Expected End Date:  ongoing                     Follow Up Date--Goal has been met and is now closed. [facility]    Current barriers:   Chronic Mental Health needs related to depression, stress and anxiety. Mental Health Needs related to Depression, requires Support, Education, Resources, Referrals, Advocacy, and Care Coordination, in order to meet Unmet Mental Health Needs. Mental Health Concerns, Lack of social support network, financial hardships and Social Isolation Patient lacks knowledge of available community counseling agencies and resources.  Clinical Goal(s): verbalize understanding of plan for management of Depression and Stress. To demonstrate a reduction in symptoms related to : Depression, connect with provider for ongoing mental health treatment.  , and increase coping skills, healthy habits, self-management skills, and stress reduction  Clinical Interventions:  Assessed patient's previous and current treatment, coping skills, support system and barriers to care. Patient provided hx  Verbalization of feelings encouraged, motivational interviewing employed Emotional support provided, positive coping strategies explored Self care/establishing healthy boundaries emphasized Patient reports that she is experiencing ongoing depression and would like treatment. She was educated on available mental health resources nearby her area. She is only interested in psychiatry at this time and will consider counseling. Patient reports significant worsening depression impacting her ability to function daily. Patient endorsed that she has stable transportation  Patient has a lack of a social support network. She lives with her son but her son is not a reliable  source of support at this time.  Patient is agreeable to referral to [facility] for psychiat[facility]. [facility] LCSW made referral on 05/29/21.  Patient will work on implementing appropriate self-care habits into their daily routine such as: drinking water, staying active around the house, taking their medications as prescribed, combating negative thoughts or emotions and staying connected with their family and friends. Positive reinforcement provided for this decision to work on this. 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 LCSW provided education on relaxation techniques such as meditation, deep breathing, massage, grounding exercises or yoga that can activate the body's relaxation response and ease symptoms of stress and anxiety. LCSW ask that when pt is struggling with difficult emotions and racing thoughts that they start this relaxation response process. LCSW provided extensive education on healthy coping skills for anxiety. SW used active and reflective listening, validated patient's feelings/concerns, and provided emotional support. UPDATE 08/15/21- Patient has an upcoming psychiatry appointment tomorrow on 08/16/21. This appointment is in person and she has stable transportation to and from this. Patient has already met with her counselor several times and has 3 upcoming scheduled sessions with therapist. Patient reports that she has build great rapport with her current therapist and reports feeling safe being able to talk with her openly about her stressors. Patient was emailed a list of mental health resources, financial assistance resources and food pantry resources on 08/15/21 per her request. Patient declines any further social work concerns now that she has been established with a long term mental health provider. Patient is agreeable to [facility] case closure now that goal has been met. [facility] LCSW is happy to get back involved at anytime if future concerns arise.   Motivational Interviewing  employed Depression screen reviewed  PHQ2/ PHQ9 completed Mindfulness or Relaxation training provided Active listening / Reflection utilized  Advance Care and HCPOA education provided Emotional Support Provided Problem [place] strategies reviewed Provided psychoeducation for mental health needs  Provided brief CBT  Reviewed mental health medications and discussed importance of compliance:  Quality of sleep assessed & Sleep Hygiene techniques promoted  Participation in counseling encouraged  Verbalization of feelings encouraged  [facility] BSW referral made on 05/29/21 for financial hardships. Per PCP, she has limited family or community support and is considering disability but experiencing financial issues due to medical conditions. Appointment was scheduled with patient to speak with [facility] BSW on 06/07/21.  Suicidal Ideation/Homicidal Ideation assessed: Patient denies SI/HI  Review resources, discussed options and provided patient information about  [place] care team collaboration (see longitudinal plan of care) UPDATE- Patient had a counseling appointment scheduled with [facility] but no psychiatry appointment. [facility] contacted [facility] and successfully scheduled psychiatry appointment. Patient was briefly educated on healthy coping skills to implement into her daily routine to reduce  her anxiety and depression. She was receptive to this education. The following information was sent to patient by email for her review:  [place] person [place] [phone] or [phone] or come in as a walk-in Monday-Wednesday 8am-11am or Friday 1pm-4pm. Accepts Medicaid ([place] or Managed) and Uninsured (Self-Pay) patients Walk in hours Monday, Tuesday, Wednesday and Friday. Medication Management 8:00 AM to 11:00 AM (Encourage patient to arrive by 7:30 AM). Therapy 8:00 AM to 3:00 PM (Encourage patient to arrive by 7:30  AM) Appointments are conducted in-person and/or tele Internal referrals last for 3 months only Patient can go to the 2nd floor outpatient program of [facility] as a walk in client at 7:00 am for psychiatry on Mondays, Wednesdays and Fridays. Patient can go in as a walk in patient for therapy on Mondays and Wednesdays at 7:30 am. Usually there is a line of 10 or more people already there by 7:30 am. You will be able to see a provider that very day. Your counseling/therapy appointment is on 07/18/21 at 1:00 with Moses Manners Your psychiatry appointment is on 08/16/21 at 1:30 [place]   Patient Goals/Self-Care Activities: Over the next 120 days Attend scheduled medical appointments Utilize healthy coping skills and supportive resources discussed Contact PCP with any questions or concerns Keep 90 percent of appointments Call your insurance provider for more information about your Enhanced Benefits  Check out counseling resources provided  Begin personal counseling with LCSW, to reduce and manage symptoms of Depression until well-established with mental health provider Accept all calls from representative with [facility] in an effort to establish ongoing mental health counseling and supportive services. Incorporate into daily practice - relaxation techniques, deep breathing exercises, and mindfulness meditation strategies. Talk about feelings with friends, family members, spiritual advisor, etc.. Contact LCSW directly [phone]), if you have questions, need assistance, or if additional social work needs are identified between now and our next scheduled telephone outreach call. Call [phone] for mental health hotline/crisis line if needed (24/7 available) Try techniques to reduce symptoms of anxiety (deep breathing, distraction, positive self talk, etc)  06/07/21: BSW completed telephone outreach with patient. She stated she was able to pay her rent this month. At this time she does not need assistance with  rent or utilities, but does need assistance with household goods. BSW informed patient she can contact [employer] and referred patient to [place] [phone]. No other resources are needed at this time.  06/27/21: BSW completed telephone outreach with patient. She stated she contacted [employer] to assist with household items but they do not assist with it. Patient asked for resources that provided church clothes, BSW infomred she can provide her with [employer] [phone]. Patient stated she was driving and could not write the number down but would call BSW back to get the phone number. No other resources needed at this time.      Follow up:  Patient agrees to [facility] case closure at this time.    Plan: The Managed Medicaid care management team is available to follow up with the patient after provider conversation with the patient regarding recommendation for care management engagement and subsequent re-referral to the care management team.   Eula Fried, BSW, MSW, LCSW Managed Medicaid LCSW [email or web address] Phone: [phone]

## 2021-08-16 ENCOUNTER — Ambulatory Visit (INDEPENDENT_AMBULATORY_CARE_PROVIDER_SITE_OTHER): Payer: Medicaid Other | Admitting: Psychiatry

## 2021-08-16 ENCOUNTER — Encounter (HOSPITAL_COMMUNITY): Payer: Self-pay | Admitting: Psychiatry

## 2021-08-16 VITALS — BP 141/93 | HR 95 | Ht 60.0 in | Wt 185.0 lb

## 2021-08-16 DIAGNOSIS — F411 Generalized anxiety disorder: Secondary | ICD-10-CM

## 2021-08-16 DIAGNOSIS — F333 Major depressive disorder, recurrent, severe with psychotic symptoms: Secondary | ICD-10-CM

## 2021-08-16 MED ORDER — DULOXETINE HCL 30 MG PO CPEP: 30.0000 mg | ORAL_CAPSULE | Freq: Every day | ORAL | 2 refills | Status: DC
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
## 2021-08-16 NOTE — Patient Instructions (Signed)
Follow-up in 4 weeks

## 2021-08-22 ENCOUNTER — Other Ambulatory Visit: Payer: Self-pay | Admitting: Family Medicine

## 2021-08-22 ENCOUNTER — Other Ambulatory Visit: Payer: Self-pay | Admitting: Neurology

## 2021-08-22 ENCOUNTER — Encounter (HOSPITAL_COMMUNITY): Payer: Self-pay

## 2021-08-22 ENCOUNTER — Emergency Department (HOSPITAL_COMMUNITY): Payer: Medicaid Other

## 2021-08-22 ENCOUNTER — Emergency Department (HOSPITAL_COMMUNITY)
Admission: EM | Admit: 2021-08-22 | Discharge: 2021-08-23 | Discharge status: Against Medical Advice | Payer: Medicaid Other | Attending: Emergency Medicine | Admitting: Emergency Medicine

## 2021-08-22 ENCOUNTER — Other Ambulatory Visit: Payer: Self-pay

## 2021-08-22 DIAGNOSIS — Z20822 Contact with and (suspected) exposure to covid-19: Secondary | ICD-10-CM | POA: Insufficient documentation

## 2021-08-22 DIAGNOSIS — I10 Essential (primary) hypertension: Secondary | ICD-10-CM | POA: Diagnosis not present

## 2021-08-22 DIAGNOSIS — Z87891 Personal history of nicotine dependence: Secondary | ICD-10-CM | POA: Insufficient documentation

## 2021-08-22 DIAGNOSIS — Z8616 Personal history of COVID-19: Secondary | ICD-10-CM | POA: Diagnosis not present

## 2021-08-22 DIAGNOSIS — R519 Headache, unspecified: Secondary | ICD-10-CM | POA: Diagnosis present

## 2021-08-22 DIAGNOSIS — M545 Low back pain, unspecified: Secondary | ICD-10-CM

## 2021-08-22 LAB — COMPREHENSIVE METABOLIC PANEL
ALT: 15 U/L (ref 0–44)
AST: 16 U/L (ref 15–41)
Albumin: 3.9 g/dL (ref 3.5–5.0)
Alkaline Phosphatase: 70 U/L (ref 38–126)
Anion gap: 6 (ref 5–15)
BUN: 16 mg/dL (ref 6–20)
CO2: 23 mmol/L (ref 22–32)
Calcium: 9.4 mg/dL (ref 8.9–10.3)
Chloride: 111 mmol/L (ref 98–111)
Creatinine, Ser: 0.86 mg/dL (ref 0.44–1.00)
GFR, Estimated: >60 mL/min (ref >60)
Glucose, Bld: 97 mg/dL (ref 70–99)
Potassium: 4.4 mmol/L (ref 3.5–5.1)
Sodium: 140 mmol/L (ref 135–145)
Total Bilirubin: 0.6 mg/dL (ref 0.3–1.2)
Total Protein: 6.4 g/dL — ABNORMAL LOW (ref 6.5–8.1)

## 2021-08-22 LAB — CBC
HCT: 40.1 % (ref 36.0–46.0)
Hemoglobin: 13.2 g/dL (ref 12.0–15.0)
MCH: 30.9 pg (ref 26.0–34.0)
MCHC: 32.9 g/dL (ref 30.0–36.0)
MCV: 93.9 fL (ref 80.0–100.0)
Platelets: 294 10*3/uL (ref 150–400)
RBC: 4.27 MIL/uL (ref 3.87–5.11)
RDW: 12.3 % (ref 11.5–15.5)
WBC: 6.9 10*3/uL (ref 4.0–10.5)
nRBC: 0 % (ref 0.0–0.2)

## 2021-08-22 LAB — TROPONIN I (HIGH SENSITIVITY): Troponin I (High Sensitivity): 3 ng/L (ref <18)

## 2021-08-22 MED ORDER — HALOPERIDOL LACTATE 5 MG/ML IJ SOLN
5.0000 mg | Freq: Once | INTRAMUSCULAR | Status: AC
Start: 2021-08-23 — End: 2021-08-23
  Administered 2021-08-23: 5 mg via INTRAMUSCULAR

## 2021-08-22 MED ORDER — ACETAMINOPHEN 500 MG PO TABS
1000.0000 mg | ORAL_TABLET | Freq: Once | ORAL | Status: AC
  Administered 2021-08-23: 1000 mg via ORAL

## 2021-08-22 NOTE — ED Triage Notes (Signed)
Pt reports she has had a headache off and on since Sunday, reports she has a brain aneurysm. She also reports SOB, heart palpitations, loss of smell, loss of taste, sharp pain to back of left leg, neck pain, shoulder pain, aching fingers, nausea, watery mouth. He states her forehead feels like it is swollen.

## 2021-08-22 NOTE — ED Provider Triage Note (Signed)
Emergency Medicine Provider Triage Evaluation Note  Christine Gordon , a 41 y.o. female  was evaluated in triage.  Pt complains of head ongoing intermittently for four days. States R sided and occipital HA.   No NV. Myriad other symptoms.     Review of Systems  Positive: HA Negative: Fever   Physical Exam  BP (!) 151/99   Pulse (!) 108   Temp 99 F (37.2 C) (Oral)   Resp 19   Ht 5' (1.524 m)   Wt 82.6 kg   SpO2 98%   BMI 35.54 kg/m  Gen:   Awake, no distress  Resp:  Normal effort  MSK:   Moves extremities without difficulty  Other:    Medical Decision Making  Medically screening exam initiated at 5:02 PM.  Appropriate orders placed.  Carson Meche was informed that the remainder of the evaluation will be completed by another provider, this initial triage assessment does not replace that evaluation, and the importance of remaining in the ED until their evaluation is complete.  CT head, labs, ekg   Tedd Sias, [place] 08/23/21 1921

## 2021-08-23 LAB — RESP PANEL BY RT-PCR (FLU A&B, COVID) ARPGX2
Influenza A by PCR: NEGATIVE
Influenza B by PCR: NEGATIVE
SARS Coronavirus 2 by RT PCR: NEGATIVE

## 2021-08-23 MED ORDER — ACETAMINOPHEN 500 MG PO TABS
ORAL_TABLET | ORAL | Status: AC
  Filled 2021-08-23: qty 2

## 2021-08-23 MED ORDER — HALOPERIDOL LACTATE 5 MG/ML IJ SOLN
INTRAMUSCULAR | Status: AC
  Filled 2021-08-23: qty 1

## 2021-08-23 NOTE — ED Notes (Signed)
Pt states she has to go bc ride is going to leave her. Reports her headache is completely gone. RN stated MD will need a few mins to get her d/c papers done. She said she doesn't need them and left.

## 2021-08-23 NOTE — ED Provider Notes (Signed)
[facility]-EMERGENCY DEPT [facility] Emergency Department Provider Note MRN:  [id number]  Arrival date & time: 08/23/21     Chief Complaint   Headache   History of Present Illness   Christine Gordon is a 41 y.o. year-old female with no pertinent past medical history presenting to the ED with chief complaint of headache.  Headache on and off for the past several days.  Similar to prior headaches.  Also has a history of brain aneurysm.  Multiple other complaints.  Palpitations, loss of smell, occasional leg pain, neck pain, shoulder pain, fingers aching, nausea, watery mouth.  Review of Systems  A thorough review of systems was obtained and all systems are negative except as noted in the HPI and PMH.   Patient's Health History    Past Medical History:  Diagnosis Date   Abdominal bloating 12/22/2020   Bleeding nose 12/22/2020   COVID-19 12/22/2020   COVID infection June 2022.    H/O cesarean section    x3 perpatient   Healthcare maintenance 12/22/2020   Hypertension    Knee pain 04/04/2021   Microscopic hematuria 05/24/2020   Formatting of this note might be different from the original. Urinalysis 03/22 and 04/22. No urine infection Referred to Urology 05/22   Vitamin D deficiency 07/21/2020    Past Surgical History:  Procedure Laterality Date   CESAREAN SECTION N/A    x3 per patient    Family History  Problem Relation Age of Onset   Healthy Mother    Colon cancer Sister    High Cholesterol Maternal Aunt    High blood pressure Maternal Aunt    Diabetes Paternal Aunt    Diabetes Paternal Uncle    Diabetes Paternal Grandmother    Esophageal cancer Neg Hx    Pancreatic cancer Neg Hx    Stomach cancer Neg Hx     Social History   Socioeconomic History   Marital status: Single    Spouse name: Not on file   Number of children: 4   Years of education: Not on file   Highest education level: Not on file  Occupational History   Not on file  Tobacco Use   Smoking status:  Former    Types: Cigarettes    Quit date: 2019    Years since quitting: 4.4   Smokeless tobacco: Never  Vaping Use   Vaping Use: Never used  Substance and Sexual Activity   Alcohol use: Never   Drug use: Never   Sexual activity: Not on file  Other Topics Concern   Not on file  Social History Narrative   Christine Gordon has 4 children: daughter (b ~64) and 3 sons (b.~2021),(b.2003), and (b. ~2005).   2 oldest sons live in [place] and her youngest son lives with her   Right handed   No caffeine   Social Determinants of Health   Financial Resource Strain: Not on file  Food Insecurity: Not on file  Transportation Needs: Not on file  Physical Activity: Not on file  Stress: Stress Concern Present (08/15/2021)   [employer] of Occupational Health - Occupational Stress Questionnaire    Feeling of Stress : To some extent  Social Connections: Not on file  Intimate Partner Violence: Not on file     Physical Exam   Vitals:   08/22/21 2113 08/23/21 0000  BP: (!) 148/94 (!) 145/86  Pulse: 76 71  Resp: 16 16  Temp:  98.6 F (37 C)  SpO2: 100% 99%  CONSTITUTIONAL: Well-appearing, NAD NEURO/PSYCH:  Alert and oriented x 3, normal and symmetric strength, subjective decreased sensation to the left arm and leg (chronic) EYES:  eyes equal and reactive ENT/NECK:  no LAD, no JVD CARDIO: Regular rate, well-perfused, normal S1 and S2 PULM:  CTAB no wheezing or rhonchi GI/GU:  non-distended, non-tender MSK/SPINE:  No gross deformities, no edema SKIN:  no rash, atraumatic   *Additional and/or pertinent findings included in MDM below  Diagnostic and Interventional Summary    EKG Interpretation  Date/Time:    Ventricular Rate:    PR Interval:    QRS Duration:   QT Interval:    QTC Calculation:   R Axis:     Text Interpretation:         Labs Reviewed  COMPREHENSIVE METABOLIC PANEL - Abnormal; Notable for the following components:      Result Value   Total Protein  6.4 (*)    All other components within normal limits  RESP PANEL BY RT-PCR (FLU A&B, COVID) ARPGX2  CBC  TROPONIN I (HIGH SENSITIVITY)  TROPONIN I (HIGH SENSITIVITY)    CT HEAD WO CONTRAST ( )  Final Result      Medications  haloperidol lactate (HALDOL) injection 5 mg (5 mg Intramuscular Given 08/23/21 0006)  acetaminophen (TYLENOL) tablet 1,000 mg (1,000 mg Oral Given 08/23/21 0006)     Procedures  /  Critical Care Procedures  ED Course and Medical Decision Making  Initial Impression and Ddx Suspect migraine or primary headache disorder, feels similar to prior headaches.  Reassuring neurological exam.  Patient has well-documented left-sided sensory deficits for a long time and she states that is unchanged.  CT head obtained and is unremarkable.  Plan was to provide symptomatic management and reassess but patient eloped from the hospital.  Past medical/surgical history that increases complexity of ED encounter: Brain aneurysm  Interpretation of Diagnostics I personally reviewed the CT head and my interpretation is as follows: No obvious bleeding    Patient Reassessment and Ultimate Disposition/Management     Patient eloped without a completed evaluation or reassessment.  Patient management required discussion with the following services or consulting groups:  None  Complexity of Problems Addressed Acute illness or injury that poses threat of life of bodily function  Additional Data Reviewed and Analyzed Further history obtained from: None  Additional Factors Impacting ED Encounter Risk None  Elmer Sow. Pilar Plate, MD [facility] Emergency Medicine [facility] [email or web address]  Final Clinical Impressions(s) / ED Diagnoses     ICD-10-CM   1. Nonintractable headache, unspecified chronicity pattern, unspecified headache type  R51.9       ED Discharge Orders     None        Discharge Instructions Discussed with and Provided to Patient:    Discharge Instructions   None      Sabas Sous, MD 08/23/21 [phone]

## 2021-08-29 ENCOUNTER — Ambulatory Visit (INDEPENDENT_AMBULATORY_CARE_PROVIDER_SITE_OTHER): Payer: Medicaid Other | Admitting: Licensed Clinical Social Worker

## 2021-08-29 DIAGNOSIS — F333 Major depressive disorder, recurrent, severe with psychotic symptoms: Secondary | ICD-10-CM | POA: Diagnosis not present

## 2021-08-29 NOTE — Progress Notes (Signed)
THERAPIST PROGRESS NOTE  Session Time: 60 minutes  Participation Level: Active  Behavioral Response: CasualAlertDepressed, Hopeless, and Irritable  Type of Therapy: Individual Therapy  Treatment Goals addressed: coping  ProgressTowards Goals: initial   Interventions: Solution Focused, Supportive, and Reframing  Summary: Christine Gordon is a 41 y.o. female who presents for f/u with this cln. She arrives on time and maintains good eye contact throughout the session. She reports no changes in mood since CCA and states her headaches have gotten worse. She states the brain aneurism is currently stable, last saw a neurosurgeon three months ago who stated it's safer not to operate at this time, and states she was told she has a 50% chance of surviving the surgery. She states she experiences some relief from medications for 2-3 hours at a time, but that her pain has no discernible pattern and she expresses frustration with not receiving the help she needs. She states she wants to apply for SSD again and consulted an attorney and was informed she would have to stop working in order to qualify. Pt states she cannot do so for financial reasons. She states she has not told her children (youngest is 7), but that her cousin told other family members and those family members have shown no concern. She states she continues to experience the hallucination of a little boy in a white shirt when stressed or upset. She expresses passive SI and when asked about intent, she states "I don't know. I don't know what I might do. Maybe I'm better off not being here anymore." She states she could OD if she decides to due to having many medications in her home. She states she is scheduled to see a neurosurgeon on Friday at 2:30 pm. She expresses hopelessness regarding anything that can be done about her pain. When asked about intent again, she continues to state "I don't know." When advised to call 911 or come to [facility] for an  assessment should she become actively suicidal, she responds, "What are they going to do?" Pt is resistant to telling her children about the aneurism and the pain but acknowledges doing so could be beneficial. "I hear you, Doc." Confirms she knows cln is not an MD but likes the nickname. Pt receptive to some feedback from cln and accepts recommendations for requesting assistance. Pt agreeable to cln calling on Friday at 4:30 pm and verbalizes understanding that if the call is not returned within one hour, cln will contact [employer] for a welfare check. Pt accepts a hug at the end of the session and endorses seeing the little boy. Pt receptive to reframing regarding hallucination.  Suicidal/Homicidal: Yeswithout intent/plan  Therapist Response: Cln assessed for current stressors, symptoms, and safety since last session. Cln utilized active listening and validation to assist with processing. Cln completed safety plan with pt, advised her to speak with HR at her job regarding medical leave, and recommended she contact [employer] to inquire about financial resources. Cln strongly advised pt to discuss her diagnosis and chronic pain with her adult children for the purpose of increasing support. Cln advised pt to seek help at the ED or [facility] should she become actively suicidal and stated more resources could be available to help with financial stressors and pain. Cln strongly encouraged pt to keep asking for help everywhere she could and reiterated the importance of sharing this with her children. Cln expressed optimism for her appt with neurosurgeon and reminded her cln will call at 4:30 on Friday to  check on her safety. Cln reframed hallucination as the little boy being there when she is upset so that she does not feel alone. Cln confirmed next scheduled appt.   Plan: Return again in 2 weeks.  Diagnosis: MDD (major depressive disorder), recurrent, severe, with psychosis (HCC)  Collaboration of Care: Other none required  for this visit  Patient/Guardian was advised Release of Information must be obtained prior to any record release in order to collaborate their care with an outside provider. Patient/Guardian was advised if they have not already done so to contact the registration department to sign all necessary forms in order for [place] to release information regarding their care.   Consent: Patient/Guardian gives verbal consent for treatment and assignment of benefits for services provided during this visit. Patient/Guardian expressed understanding and agreed to proceed.   Wyvonnia Lora, LCSWA 08/29/2021

## 2021-08-31 ENCOUNTER — Telehealth (HOSPITAL_COMMUNITY): Payer: Self-pay | Admitting: Licensed Clinical Social Worker

## 2021-08-31 NOTE — Telephone Encounter (Signed)
See call intake 

## 2021-09-12 ENCOUNTER — Ambulatory Visit (HOSPITAL_COMMUNITY): Payer: Self-pay | Admitting: Licensed Clinical Social Worker

## 2021-09-13 ENCOUNTER — Encounter (HOSPITAL_COMMUNITY): Payer: Medicaid Other | Admitting: Psychiatry

## 2021-09-14 ENCOUNTER — Encounter (HOSPITAL_COMMUNITY): Payer: Medicaid Other | Admitting: Psychiatry

## 2021-09-21 ENCOUNTER — Encounter (HOSPITAL_COMMUNITY): Payer: Self-pay | Admitting: Psychiatry

## 2021-09-21 ENCOUNTER — Ambulatory Visit (INDEPENDENT_AMBULATORY_CARE_PROVIDER_SITE_OTHER): Payer: Medicaid Other | Admitting: Psychiatry

## 2021-09-21 VITALS — BP 142/90 | HR 95

## 2021-09-21 DIAGNOSIS — F333 Major depressive disorder, recurrent, severe with psychotic symptoms: Secondary | ICD-10-CM

## 2021-09-21 MED ORDER — QUETIAPINE FUMARATE 25 MG PO TABS: 25.0000 mg | ORAL_TABLET | Freq: Every day | ORAL | 2 refills | Status: DC

## 2021-09-21 MED ORDER — DULOXETINE HCL 30 MG PO CPEP: 30.0000 mg | ORAL_CAPSULE | Freq: Two times a day (BID) | ORAL | 2 refills | Status: DC

## 2021-09-21 NOTE — Patient Instructions (Signed)
Follow-up in 4 weeks

## 2021-09-21 NOTE — Progress Notes (Cosign Needed Addendum)
BH MD/PA/NP OP Progress Note  09/21/2021 3:09 PM Christine Gordon  MRN:  [id number]  Chief Complaint: No chief complaint on file.  Subjective: Patient is a 41 year old female with past psychiatric history of MDD with psychosis (recently diagnosed) and medical history of HTN, HLD, back pain due to sciatica, cervical spondylosis, chronic myofascial pain, prediabetes, asthma and migraine presented to [facility] outpatient clinic for medication management for depression.  Pt reports that her mood is " ok". She reports not much improvement in symptoms of depression and anxiety.  She reports poor sleep and poor appetite.  She reports irritability, racing thoughts and feeling angry at her family.  She reports that her mom called her recently but she did not talk to her because of past differences.  She reports that people at her job are telling her that she is snapping on her coworkers but she is not aware of that. Currently, she denies any active suicidal ideations, homicidal ideations.  She reports that thoughts about hurting herself crossed her mind few times which are chronic but she denies any plan or intent. She contracts for safety at this time.  She has negative thoughts about her sister but knows that she is not going to hurt her.  She reports AVH, states she still sees that little boy which she has been seeing since she was a child and also hears people calling her name.  She denies any medication side effects and has been tolerating it well. She reports no change in her current stressors.  She is currently employed part-time at [employer]. She denies illicit drugs or alcohol use.  She does not smoke cigarettes.  Discussed increasing Cymbalta to twice daily and adding Seroquel for irritability, racing thoughts, insomnia and depression.  Risk and benefits discussed and patient agrees with the plan.  Patient is alert and oriented x 4,  calm, cooperative, and fully engaged in conversation during the encounter.   Her thought process is linear with coherent speech . She does not appear to be responding to internal/external stimuli .     Past Psychiatric History: History of MDD with psychosis. She reports 2 past suicidal attempts when she was 78-82 years old by putting a knife to her throat and trying to overdose.  She reports that she never actually overdosed.  She denies past psychiatric hospitalizations.  She has never been on any psychotropic medications.  She gets therapy at [facility] every 2 weeks.  Past Medical History:  Past Medical History:  Diagnosis Date   Abdominal bloating 12/22/2020   Bleeding nose 12/22/2020   COVID-19 12/22/2020   COVID infection June 2022.    H/O cesarean section    x3 perpatient   Healthcare maintenance 12/22/2020   Hypertension    Knee pain 04/04/2021   Microscopic hematuria 05/24/2020   Formatting of this note might be different from the original. Urinalysis 03/22 and 04/22. No urine infection Referred to Urology 05/22   Vitamin D deficiency 07/21/2020    Past Surgical History:  Procedure Laterality Date   CESAREAN SECTION N/A    x3 per patient    Family Psychiatric History: None  Family History:  Family History  Problem Relation Age of Onset   Healthy Mother    Colon cancer Sister    High Cholesterol Maternal Aunt    High blood pressure Maternal Aunt    Diabetes Paternal Aunt    Diabetes Paternal Uncle    Diabetes Paternal Grandmother    Esophageal cancer Neg Hx  Pancreatic cancer Neg Hx    Stomach cancer Neg Hx     Social History:  Social History   Socioeconomic History   Marital status: Single    Spouse name: Not on file   Number of children: 4   Years of education: Not on file   Highest education level: Not on file  Occupational History   Not on file  Tobacco Use   Smoking status: Former    Types: Cigarettes    Quit date: 2019    Years since quitting: 4.5   Smokeless tobacco: Never  Vaping Use   Vaping Use: Never used  Substance  and Sexual Activity   Alcohol use: Never   Drug use: Never   Sexual activity: Not on file  Other Topics Concern   Not on file  Social History Narrative   Ms. Clevinger has 4 children: daughter (b ~20) and 3 sons (b.~2021),(b.2003), and (b. ~2005).   2 oldest sons live in [place] and her youngest son lives with her   Right handed   No caffeine   Social Determinants of Health   Financial Resource Strain: Not on file  Food Insecurity: Not on file  Transportation Needs: Not on file  Physical Activity: Not on file  Stress: Stress Concern Present (08/15/2021)   [employer] of Occupational Health - Occupational Stress Questionnaire    Feeling of Stress : To some extent  Social Connections: Not on file    Allergies:  Allergies  Allergen Reactions   Nsaids Other (See Comments)    NSAIDs contraindicated due to cerebral aneurysm and fibromuscular dysplasia.   Triptans Other (See Comments)    Triptans are contraindicated due to cerebral aneurysm and fibromuscular dysplasia.     Metabolic Disorder Labs: No results found for: "HGBA1C", "MPG" No results found for: "PROLACTIN" No results found for: "CHOL", "TRIG", "HDL", "CHOLHDL", "VLDL", "LDLCALC" Lab Results  Component Value Date   TSH 2.15 04/23/2021    Therapeutic Level Labs: No results found for: "LITHIUM" No results found for: "VALPROATE" No results found for: "CBMZ"  Current Medications: Current Outpatient Medications  Medication Sig Dispense Refill   QUEtiapine (SEROQUEL) 25 MG tablet Take 1 tablet (25 mg total) by mouth at bedtime. 60 tablet 2   albuterol (VENTOLIN HFA) 108 (90 Base) MCG/ACT inhaler Inhale into the lungs every 6 (six) hours as needed.     aspirin EC 81 MG tablet Take 81 mg by mouth daily. Swallow whole.     Capsaicin-Menthol-Methyl Sal 0.001-10-20 % LOTN Apply to knee daily for pain relief. 113.4 g 1   Cholecalciferol 125 MCG (5000 UT) TABS Take by mouth.     cyclobenzaprine (FLEXERIL) 5 MG  tablet Take 5 mg by mouth 3 (three) times daily as needed.     docusate sodium (COLACE) 250 MG capsule Take 1 capsule (250 mg total) by mouth daily. 10 capsule 0   DULoxetine (CYMBALTA) 30 MG capsule Take 1 capsule (30 mg total) by mouth 2 (two) times daily. 30 capsule 2   famotidine (PEPCID) 40 MG tablet Take 1 tablet (40 mg total) by mouth at bedtime. 30 tablet 2   lidocaine (XYLOCAINE) 5 % ointment APPLY TOPICALLY 1 APPLICATION AS NEEDED 35.44 g 1   linaclotide (LINZESS) 290 MCG CAPS capsule Take 1 capsule (290 mcg total) by mouth daily before breakfast. 30 capsule 6   lisinopril (ZESTRIL) 10 MG tablet Take 1 tablet (10 mg total) by mouth at bedtime. 90 tablet 3   medroxyPROGESTERone Acetate  150 MG/ML SUSY Inject into the muscle.     meloxicam (MOBIC) 15 MG tablet Take 15 mg by mouth daily as needed.     naproxen (NAPROSYN) 375 MG tablet Take 1 tablet (375 mg total) by mouth 2 (two) times daily. 20 tablet 0   rosuvastatin (CRESTOR) 10 MG tablet Take 10 mg by mouth daily.     senna (SENOKOT) 8.6 MG TABS tablet Take 1 tablet by mouth daily.     Spacer/Aero-Holding Chambers (EASIVENT) inhaler Use as instructed with inhaler.     Ubrogepant (UBRELVY) 100 MG TABS Medication Samples have been provided to the patient.  Drug name: Bernita Raisin       Strength: 100mg         Qty: 4  LOT:  Exp.Date: 08/2022  Dosing instructions:   The patient has been instructed regarding the correct time, dose, and frequency of taking this medication, including desired effects and most common side effects.   [name] 11:35 AM 05/22/2021 4 tablet 0   verapamil (CALAN-SR) 120 MG CR tablet Take 1 tablet (120 mg total) by mouth at bedtime. 30 tablet 2   zonisamide (ZONEGRAN) 100 MG capsule Take 1 capsule (100 mg total) by mouth daily. 30 capsule 5   No current facility-administered medications for this visit.     Musculoskeletal: Strength & Muscle Tone: within normal limits Gait & Station: normal Patient  leans: N/A  Psychiatric Specialty Exam: Review of Systems  Blood pressure (!) 142/90, pulse 95, SpO2 100 %.There is no height or weight on file to calculate BMI.  General Appearance: Casual  Eye Contact:  Fair  Speech:  Clear and Coherent and Normal Rate  Volume:  Normal  Mood:  Anxious and Depressed  Affect:  Congruent and Depressed  Thought Process:  Coherent  Orientation:  Full (Time, Place, and Person)  Thought Content: Hallucinations: Auditory Visual   Suicidal Thoughts:  No crossed her mind a few times recently, denies it today, contracts for safety at this time.  Homicidal Thoughts:  No   Memory:  Immediate;   Good Recent;   Fair Remote;   Fair  Judgement:  Fair  Insight:  Fair  Psychomotor Activity:  Decreased  Concentration:  Concentration: Fair and Attention Span: Fair  Recall:  05/24/2021 of Knowledge: Fair  Language: Good  Akathisia:  No  Handed:  Right  AIMS (if indicated): not done  Assets:  Communication Skills Desire for Improvement Housing Resilience Others:  employed  ADL's:  Intact  Cognition: WNL  Sleep:  Fair   Screenings: GAD-7    Flowsheet Row Office Visit from 06/13/2021 in [facility]  Total GAD-7 Score 13      PHQ2-9    Flowsheet Row Counselor from 07/18/2021 in [facility] Office Visit from 06/13/2021 in [facility] Patient Outreach Telephone from 05/29/2021 in [facility] Office Visit from 04/04/2021 in [facility] Office Visit from 01/15/2021 in [facility]  PHQ-2 Total Score 6 4 2 1 1   PHQ-9 Total Score 14 9 4 2 3       Flowsheet Row ED from 08/22/2021 in [facility] EMERGENCY DEPARTMENT ED from 03/16/2021 in [facility]  C-SSRS RISK CATEGORY No Risk No Risk        Assessment and Plan: Patient is a 41 year old female with past  psychiatric history of MDD with psychosis (  recently diagnosed) and medical history of HTN, HLD, back pain due to sciatica, cervical spondylosis, chronic myofascial pain, prediabetes, asthma and migraine presented to [facility] outpatient clinic for medication management for depression. Patient still has neurovegetative symptoms of depression with psychosis.  Psychosocial stressors include multiple medical problems , not getting along with family and lack of good support system.  Will increase the dose of Cymbalta and add Seroquel for irritability, depression, insomnia and psychosis.  Risk and benefit discussed with patient and patient agrees with medication trial.  MDD with psychosis GAD -Increase Cymbalta to 30 mg twice daily.  30-day prescription with 2 refills sent to her pharmacy. -Start Seroquel 25 mg nightly for mood stability, insomnia, irritability and psychosis.(QTc 441 )(R/b/se/a discussed and patient agrees with medication trial).30-day prescription with 2 refills sent to her pharmacy.  Brain aneurysm and other medical issues Follow-up with family medicine clinic   Follow-up In 4 weeks   Collaboration of Care: Collaboration of Care: Other therapist at [facility]  Patient/Guardian was advised Release of Information must be obtained prior to any record release in order to collaborate their care with an outside provider. Patient/Guardian was advised if they have not already done so to contact the registration department to sign all necessary forms in order for [place] to release information regarding their care.   Consent: Patient/Guardian gives verbal consent for treatment and assignment of benefits for services provided during this visit. Patient/Guardian expressed understanding and agreed to proceed.    Karsten Ro, MD 09/21/2021, 3:09 PM

## 2021-09-26 ENCOUNTER — Ambulatory Visit (HOSPITAL_COMMUNITY): Payer: Self-pay | Admitting: Licensed Clinical Social Worker

## 2021-10-09 ENCOUNTER — Other Ambulatory Visit (HOSPITAL_COMMUNITY)
Admission: RE | Admit: 2021-10-09 | Discharge: 2021-10-09 | Disposition: A | Discharge status: Home / Self Care | Payer: Medicaid Other | Source: Ambulatory Visit | Attending: Family Medicine | Admitting: Family Medicine

## 2021-10-09 ENCOUNTER — Encounter: Payer: Self-pay | Admitting: Family Medicine

## 2021-10-09 ENCOUNTER — Ambulatory Visit: Payer: Medicaid Other | Admitting: Family Medicine

## 2021-10-09 VITALS — BP 131/97 | HR 98 | Ht 60.0 in | Wt 180.8 lb

## 2021-10-09 DIAGNOSIS — Z113 Encounter for screening for infections with a predominantly sexual mode of transmission: Secondary | ICD-10-CM | POA: Insufficient documentation

## 2021-10-09 DIAGNOSIS — Z124 Encounter for screening for malignant neoplasm of cervix: Secondary | ICD-10-CM

## 2021-10-09 DIAGNOSIS — Z1159 Encounter for screening for other viral diseases: Secondary | ICD-10-CM | POA: Diagnosis not present

## 2021-10-09 HISTORY — DX: Encounter for screening for infections with a predominantly sexual mode of transmission: Z11.3

## 2021-10-09 LAB — POCT WET PREP (WET MOUNT)
Clue Cells Wet Prep Whiff POC: NEGATIVE
Trichomonas Wet Prep HPF POC: ABSENT
WBC, Wet Prep HPF POC: NONE SEEN

## 2021-10-09 NOTE — Patient Instructions (Signed)
It was great seeing you today!  Today we did your PAP smear and did full testing (including blood work) for any sexually transmitted infections. I will let you know of the results. Please make sure to use protection with each encounter.   Please follow up at your next scheduled appointment with Dr. Larita Fife, if anything arises between now and then, please don't hesitate to contact our office.   Thank you for allowing [place] to be a part of your medical care!  Thank you, Dr. Robyne Peers

## 2021-10-09 NOTE — Assessment & Plan Note (Signed)
-  PAP w/ HPV cotesting performed today with GC/Chlamydia and trichomonas  -wet prep performed as well -patient agreeable to blood work as well, included HIV and RPR in workup -safe sex counseling provided

## 2021-10-09 NOTE — Progress Notes (Signed)
    SUBJECTIVE:   CHIEF COMPLAINT / HPI:   Patient presents desiring to get a full STI check, including blood work. Denies fever, chills, vaginal discharge, itching or discomfort, dysuria or rash. Had not been sexually active for 3 years and then had intercourse on her birthday so she just wants to be safe and get things checked out. She did not use protection. LMP unknown as she has been on depo for so long. History of STIs include trichomonas and chlamydia.   OBJECTIVE:   BP (!) 131/97   Pulse 98   Ht 5' (1.524 m)   Wt 180 lb 12.8 oz (82 kg)   LMP  (LMP Unknown)   SpO2 100%   BMI 35.31 kg/m   General: Patient well-appearing, in no acute distress. CV: RRR, no murmurs or gallops auscultated Resp: CTAB, no wheezing, rales or rhonchi noted GU: no external rashes or lesions noted,  normal labia, clear cervical discharge noted otherwise normal cervix and vagina, no adnexal masses or tenderness noted, no bleeding noted  GU exam performed in the presence of chaperone, Cleatrice Burke, CMA.   ASSESSMENT/PLAN:   Routine screening for STI (sexually transmitted infection) -PAP w/ HPV cotesting performed today with GC/Chlamydia and trichomonas  -wet prep performed as well -patient agreeable to blood work as well, included HIV and RPR in workup -safe sex counseling provided    Health maintenance -Patient was agreeable to getting Hep C screening today, added to blood work today. -Due for PAP smear so this was performed today as well.   -PHQ-9 score of 6 with negative question 9 reviewed.   -Patient with follow up with her PCP, Dr. Larita Fife, on 8/24.   Reece Leader, DO [facility]

## 2021-10-10 LAB — CYTOLOGY - PAP
Adequacy: ABSENT
Chlamydia: NEGATIVE
Comment: NEGATIVE
Comment: NEGATIVE
Comment: NEGATIVE
Comment: NORMAL
Diagnosis: NEGATIVE
High risk HPV: NEGATIVE
Neisseria Gonorrhea: NEGATIVE
Trichomonas: NEGATIVE

## 2021-10-19 ENCOUNTER — Encounter (HOSPITAL_COMMUNITY): Payer: Medicaid Other | Admitting: Psychiatry

## 2021-10-25 ENCOUNTER — Encounter: Payer: Self-pay | Admitting: Family Medicine

## 2021-10-25 ENCOUNTER — Ambulatory Visit (INDEPENDENT_AMBULATORY_CARE_PROVIDER_SITE_OTHER): Payer: Medicaid Other | Admitting: Family Medicine

## 2021-10-25 VITALS — BP 141/93 | HR 97 | Ht 60.0 in | Wt 179.8 lb

## 2021-10-25 DIAGNOSIS — M79672 Pain in left foot: Secondary | ICD-10-CM

## 2021-10-25 DIAGNOSIS — G8929 Other chronic pain: Secondary | ICD-10-CM

## 2021-10-25 DIAGNOSIS — M79671 Pain in right foot: Secondary | ICD-10-CM

## 2021-10-25 DIAGNOSIS — M7918 Myalgia, other site: Secondary | ICD-10-CM | POA: Diagnosis not present

## 2021-10-25 NOTE — Progress Notes (Signed)
SUBJECTIVE:   CHIEF COMPLAINT / HPI:   [name] is a 41 year old woman presenting today for physical per appointment notes.  Chart review indicates that Dr. Robyne Peers has already performed a full physical on 10/09/2021.  Today, patient requests to discuss worsening pain and a foot complaint.  Worsening pain Patient with extensive history of chronic myofascial pain, cervical spondylosis, chronic complaint of left-sided weakness, and migraine.  She also suffers from MDD.  Today she reports worsening headaches, arm and shoulder pain, and achiness of legs.  Chart indicates she is currently on capsaicin cream, cyclobenzaprine 5 mg 3 times daily as needed, duloxetine 30 mg twice daily, lidocaine ointment, meloxicam 15 mg daily as needed, naproxen 375 mg twice daily, and quetiapine 5 mg nightly.  Med rec in the room with patient somewhat unclear, cannot elucidate with certainty that patient is taking all the above medications due to unclear details or administration timing.  She does report "I take all my medicines" and is quite confident in that.  We have previously discussed referral to PMR, although she has declined in the past.  She discloses that she is "tired of doctors".  Short history below:  Previously saw [facility] neurology for intractable headaches, care everywhere indicates Dr. Payton Mccallum Husseini.  Chart notes indicate that clinic notes were faxed to [facility] neurology 03/01/2021.  I believe that Dr. Shon Millet of [facility] neurology has taken over her neurology care for the migraines, first and last appointment with him was 05/22/2021.   She was also referred to [facility] neurosurgery for cervical spondylosis.  Initial consult note 10/19/2020 with Dr. Noralee Stain reports findings of cervical spondylosis and kyphosis with a reassuring neurologic exam.  Dr. Teola Bradley believes symptoms are musculoskeletal in origin given no evidence of high-grade stenosis.  He referred her to physiatry for trigger point injections, facet  blocks, and dedicated physical therapy.  Initial consult note with PT and OT through [facility] neurosurgery dated 12/13/2020 indicates recommendation of physical therapy for cervical and scapular strengthening and stretching exercises, TENS unit, Voltaren gel, Salonpas, and meloxicam.  Instructed to return in 4 to 6 weeks to consider trigger point injections.  Care Everywhere indicates 1 additional PT visit on 01/02/2021; no further visits in the chart.  Foot pain Patient reports worsening chronic bilateral foot pain across the arches.  Also some swelling of her feet and legs. She would like a podiatry referral to a podiatrist that takes her insurance.  Discussed possibility of working up here at the family medicine clinic, however she declines and would prefer a podiatry referral.  PERTINENT  PMH / PSH: HTN, cerebral aneurysm without rupture, fibromuscular dysplasia, migraine, cervical spondylosis, chronic myofascial pain, HLD, MDD  OBJECTIVE:   BP (!) 141/93   Pulse 97   Ht 5' (1.524 m)   Wt 179 lb 12.8 oz (81.6 kg)   LMP  (LMP Unknown)   SpO2 100%   BMI 35.11 kg/m    PHQ-9:     10/09/2021    9:04 AM 07/18/2021    1:39 PM 06/13/2021    3:07 PM  Depression screen PHQ 2/9  Decreased Interest 1  2  Down, Depressed, Hopeless 1  2  PHQ - 2 Score 2  4  Altered sleeping 1  1  Tired, decreased energy 1  2  Change in appetite 1  1  Feeling bad or failure about yourself  0  0  Trouble concentrating 1  1  Moving slowly or fidgety/restless 0  0  Suicidal thoughts 0  0  PHQ-9 Score 6  9  Difficult doing work/chores   Somewhat difficult     Information is confidential and restricted. Go to Review Flowsheets to unlock data.     GAD-7:     06/13/2021    4:07 PM  GAD 7 : Generalized Anxiety Score  Nervous, Anxious, on Edge 3  Control/stop worrying 3  Worry too much - different things 3  Trouble relaxing 1  Restless 1  Easily annoyed or irritable 1  Afraid - awful might happen 1  Total  GAD 7 Score 13  Anxiety Difficulty Somewhat difficult   Physical Exam General: Awake, alert, oriented, flat and depressed affect Cardiovascular: Skin warm to touch, moist mucous membranes Respiratory: Speaking clearly in full sentences, no respiratory distress MSK: Tender trigger and tender points scattered across superior posterior shoulders and cervical spinal column  ASSESSMENT/PLAN:   Chronic myofascial pain Worsening myofascial pain, worst in shoulders and neck.  Identified triggers and tender points on exam today.  Patient amenable to trigger point injections, scheduled with myself.  She declines PMR referral at this time.  Would prefer patient to bring in all medications for full med rec in near future.  Pain in both feet Patient requests referral to podiatry for chronic bilateral foot pain and swelling, located over the arch.  Discussed possibility of working up.  Clinic, however she would prefer a podiatry referral.     Christine Pho, MD [facility]

## 2021-10-25 NOTE — Patient Instructions (Addendum)
It was wonderful to see you today. Thank you for allowing me to be a part of your care. Below is a short summary of what we discussed at your visit today:  Worsening myofascial pain Come to your appointment with me for trigger point injections of the shoulders and back. Details on the next page.   If you would like referral to the physical medicine rehabilitation clinic (pain clinic), please call or send a MyChart message to let me know and I will place the referral.  Podiatry referral Per your request, I have placed a referral to podiatry.  We will send it to a podiatry clinic.  If somebody does not call you to book an appointment in 1 to 2 weeks, please give [place] a call back and let us know.  Neurosurgery It looks like it is time to go back and follow-up with your neurosurgeon. You see Dr. Greig Castilla "Mardelle Matte" Verdie Mosher at [facility] neurosurgery.  Details below.  Doreene Nest, MD   [address]   [place], [place] [address]   [phone] (Work)   [phone] (Fax)   Please bring all of your medications to every appointment!  If you have any questions or concerns, please do not hesitate to contact us via phone or MyChart message.   Fayette Pho, MD

## 2021-10-30 LAB — T PALLIDUM ANTIBODY, EIA

## 2021-10-30 LAB — HCV AB W REFLEX TO QUANT PCR: HCV Ab: NONREACTIVE

## 2021-10-30 LAB — HCV INTERPRETATION

## 2021-10-30 LAB — HIV ANTIBODY (ROUTINE TESTING W REFLEX): HIV Screen 4th Generation wRfx: NONREACTIVE

## 2021-10-30 LAB — RPR W/REFLEX TO TREPSURE: RPR: NONREACTIVE

## 2021-10-31 DIAGNOSIS — M79671 Pain in right foot: Secondary | ICD-10-CM

## 2021-10-31 HISTORY — DX: Pain in right foot: M79.671

## 2021-10-31 NOTE — Assessment & Plan Note (Signed)
Patient requests referral to podiatry for chronic bilateral foot pain and swelling, located over the arch.  Discussed possibility of working up.  Clinic, however she would prefer a podiatry referral.

## 2021-10-31 NOTE — Assessment & Plan Note (Signed)
Worsening myofascial pain, worst in shoulders and neck.  Identified triggers and tender points on exam today.  Patient amenable to trigger point injections, scheduled with myself.  She declines PMR referral at this time.  Would prefer patient to bring in all medications for full med rec in near future.

## 2021-11-02 ENCOUNTER — Telehealth (INDEPENDENT_AMBULATORY_CARE_PROVIDER_SITE_OTHER): Payer: Medicaid Other | Admitting: Psychiatry

## 2021-11-02 DIAGNOSIS — F333 Major depressive disorder, recurrent, severe with psychotic symptoms: Secondary | ICD-10-CM | POA: Diagnosis not present

## 2021-11-02 MED ORDER — DULOXETINE HCL 30 MG PO CPEP: 30.0000 mg | ORAL_CAPSULE | Freq: Two times a day (BID) | ORAL | 2 refills | Status: DC

## 2021-11-02 MED ORDER — QUETIAPINE FUMARATE 50 MG PO TABS: 50.0000 mg | ORAL_TABLET | Freq: Every day | ORAL | 2 refills | Status: DC

## 2021-11-02 NOTE — Patient Instructions (Signed)
Follow-up in 6 weeks

## 2021-11-02 NOTE — Progress Notes (Signed)
BH MD/PA/NP OP Progress Note  11/02/2021 3:00 PM Christine Gordon  MRN:  [id number] Virtual Visit via Video Note  I connected with Christine Gordon on 11/02/21 at  1:30 PM EDT by a video enabled telemedicine application and verified that I am speaking with the correct person using two identifiers.  Location: Patient: Home Provider: Clinic   I discussed the limitations of evaluation and management by telemedicine and the availability of in person appointments. The patient expressed understanding and agreed to proceed.  I discussed the assessment and treatment plan with the patient. The patient was provided an opportunity to ask questions and all were answered. The patient agreed with the plan and demonstrated an understanding of the instructions.   The patient was advised to call back or seek an in-person evaluation if the symptoms worsen or if the condition fails to improve as anticipated.  I provided 15 minutes of non-face-to-face time during this encounter.   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  Pt reports that her mood is " not good". She reports that she is feeling sick today and currently having sore throat and headache. Discussed that she should go to the medical urgent care.  She states that she went to the urgent care last time and had to wait there for 16 hours so she is not planning to go to any urgent care.  Insisted on calling PCP. She verbalizes understanding.  She reports that before this sickness, she felt like her mood was getting better but her anxiety has been up-and-down.  She is  still reporting poor sleep and poor appetite.  Currently, she denies any active suicidal ideations, and homicidal ideations.  She has chronic vague thoughts about hurting people from her past but denies any plan or intent. She reports that she always has these thoughts but has not hurt anybody.  She has chronic visual hallucinations of seeing little boy since childhood.  She reports hearing voices sometimes which do not tell her to hurt herself or other people.  Discussed that if her thoughts gets worse she should immediately go to [facility] for evaluation.  She verbalizes understanding.  She denies any medication side effects and has been tolerating it well. She reports no change in her current stressors.  She denies illicit drugs or alcohol use.  She does not smoke cigarettes.  Discussed increasing Seroquel for poor sleep, depression.  She agrees with the plan.  Past Psychiatric History: History of MDD with psychosis. She reports 2 past suicidal attempts when she was 46-35 years old by putting a knife to her throat and trying to overdose.  She reports that she never actually overdosed.  She denies past psychiatric hospitalizations.  She has never been on any psychotropic medications.  She gets therapy at [facility] every 2 weeks.  Past Medical History:  Past Medical History:  Diagnosis Date   Abdominal bloating 12/22/2020   Bleeding nose 12/22/2020   COVID-19 12/22/2020   COVID infection June 2022.    H/O cesarean section    x3 perpatient   Healthcare maintenance 12/22/2020   Hypertension    Knee pain 04/04/2021  Microscopic hematuria 05/24/2020   Formatting of this note might be different from the original. Urinalysis 03/22 and 04/22. No urine infection Referred to Urology 05/22   Vitamin D deficiency 07/21/2020    Past Surgical History:  Procedure Laterality Date   CESAREAN SECTION N/A    x3 per patient    Family Psychiatric History: None  Family History:  Family History  Problem Relation Age  of Onset   Healthy Mother    Colon cancer Sister    High Cholesterol Maternal Aunt    High blood pressure Maternal Aunt    Diabetes Paternal Aunt    Diabetes Paternal Uncle    Diabetes Paternal Grandmother    Esophageal cancer Neg Hx    Pancreatic cancer Neg Hx    Stomach cancer Neg Hx     Social History:  Social History   Socioeconomic History   Marital status: Single    Spouse name: Not on file   Number of children: 4   Years of education: Not on file   Highest education level: Not on file  Occupational History   Not on file  Tobacco Use   Smoking status: Former    Types: Cigarettes    Quit date: 2019    Years since quitting: 4.6   Smokeless tobacco: Never  Vaping Use   Vaping Use: Never used  Substance and Sexual Activity   Alcohol use: Never   Drug use: Never   Sexual activity: Not on file  Other Topics Concern   Not on file  Social History Narrative   Ms. Maillet has 4 children: daughter (b ~80) and 3 sons (b.~2021),(b.2003), and (b. ~2005).   2 oldest sons live in [place] and her youngest son lives with her   Right handed   No caffeine   Social Determinants of Health   Financial Resource Strain: Not on file  Food Insecurity: Not on file  Transportation Needs: Not on file  Physical Activity: Not on file  Stress: Stress Concern Present (08/15/2021)   [employer] of Occupational Health - Occupational Stress Questionnaire    Feeling of Stress : To some extent  Social Connections: Not on file    Allergies:  Allergies  Allergen Reactions   Nsaids Other (See Comments)    NSAIDs contraindicated due to cerebral aneurysm and fibromuscular dysplasia.   Triptans Other (See Comments)    Triptans are contraindicated due to cerebral aneurysm and fibromuscular dysplasia.     Metabolic Disorder Labs: No results found for: "HGBA1C", "MPG" No results found for: "PROLACTIN" No results found for: "CHOL", "TRIG", "HDL", "CHOLHDL", "VLDL", "LDLCALC" Lab  Results  Component Value Date   TSH 2.15 04/23/2021    Therapeutic Level Labs: No results found for: "LITHIUM" No results found for: "VALPROATE" No results found for: "CBMZ"  Current Medications: Current Outpatient Medications  Medication Sig Dispense Refill   albuterol (VENTOLIN HFA) 108 (90 Base) MCG/ACT inhaler Inhale into the lungs every 6 (six) hours as needed.     aspirin EC 81 MG tablet Take 81 mg by mouth daily. Swallow whole.     Capsaicin-Menthol-Methyl Sal 0.001-10-20 % LOTN Apply to knee daily for pain relief. 113.4 g 1   Cholecalciferol 125 MCG (5000 UT) TABS Take by mouth.     cyclobenzaprine (FLEXERIL) 5 MG tablet Take 5 mg by mouth 3 (three) times daily as needed.     docusate sodium (COLACE) 250 MG capsule Take 1 capsule (250 mg total) by mouth daily. 10 capsule  0   DULoxetine (CYMBALTA) 30 MG capsule Take 1 capsule (30 mg total) by mouth 2 (two) times daily. 60 capsule 2   famotidine (PEPCID) 40 MG tablet Take 1 tablet (40 mg total) by mouth at bedtime. 30 tablet 2   lidocaine (XYLOCAINE) 5 % ointment APPLY TOPICALLY 1 APPLICATION AS NEEDED 35.44 g 1   linaclotide (LINZESS) 290 MCG CAPS capsule Take 1 capsule (290 mcg total) by mouth daily before breakfast. 30 capsule 6   lisinopril (ZESTRIL) 10 MG tablet Take 1 tablet (10 mg total) by mouth at bedtime. 90 tablet 3   medroxyPROGESTERone Acetate 150 MG/ML SUSY Inject into the muscle.     meloxicam (MOBIC) 15 MG tablet Take 15 mg by mouth daily as needed.     naproxen (NAPROSYN) 375 MG tablet Take 1 tablet (375 mg total) by mouth 2 (two) times daily. 20 tablet 0   QUEtiapine (SEROQUEL) 50 MG tablet Take 1 tablet (50 mg total) by mouth at bedtime. 30 tablet 2   rosuvastatin (CRESTOR) 10 MG tablet Take 10 mg by mouth daily.     senna (SENOKOT) 8.6 MG TABS tablet Take 1 tablet by mouth daily.     Spacer/Aero-Holding Chambers (EASIVENT) inhaler Use as instructed with inhaler.     Ubrogepant (UBRELVY) 100 MG TABS Medication  Samples have been provided to the patient.  Drug name: Bernita Raisin       Strength: 100mg         Qty: 4  LOT  Exp.Date: 08/2022  Dosing instructions:   The patient has been instructed regarding the correct time, dose, and frequency of taking this medication, including desired effects and most common side effects.   [name] 11:35 AM 05/22/2021 4 tablet 0   verapamil (CALAN-SR) 120 MG CR tablet Take 1 tablet (120 mg total) by mouth at bedtime. 30 tablet 2   zonisamide (ZONEGRAN) 100 MG capsule Take 1 capsule (100 mg total) by mouth daily. 30 capsule 5   No current facility-administered medications for this visit.     Musculoskeletal: Strength & Muscle Tone: Not evaluated due to virtual visit. Gait-not able to evaluate due to virtual visit. Patient leans: N/A  Psychiatric Specialty Exam: Review of Systems  There were no vitals taken for this visit.There is no height or weight on file to calculate BMI.  General Appearance: Casual  Eye Contact:  Fair  Speech:  Clear and Coherent and Normal Rate  Volume:  Normal  Mood:  Anxious and Depressed  Affect:  Congruent and Depressed  Thought Process:  Coherent  Orientation:  Full (Time, Place, and Person)  Thought Content: Hallucinations: Auditory Visual   Suicidal Thoughts:  No   Homicidal Thoughts:  No chronic vague thoughts about hurting people from her past but denies any plan or intent.   Memory:  Immediate;   Good Recent;   Fair Remote;   Fair  Judgement:  Fair  Insight:  Fair  Psychomotor Activity:  Normal  Concentration:  Concentration: Fair and Attention Span: Fair  Recall:  05/24/2021 of Knowledge: Fair  Language: Good  Akathisia:  No  Handed:  Right  AIMS (if indicated): not done  Assets:  Communication Skills Desire for Improvement Housing Resilience Others:  employed  ADL's:  Intact  Cognition: WNL  Sleep:  Fair   Screenings: GAD-7    Flowsheet Row [facility]  Total GAD-7 Score 13      PHQ2-9  Flowsheet Row Office Visit from 10/09/2021 in [facility] Counselor from 07/18/2021 in [facility] Office Visit from 06/13/2021 in [facility] Patient Outreach Telephone from 05/29/2021 in [facility] Coordination Office Visit from 04/04/2021 in [facility]  PHQ-2 Total Score 2 6 4 2 1   PHQ-9 Total Score 6 14 9 4 2       Flowsheet Row ED from 08/22/2021 in [facility] EMERGENCY [facility]PART[facility] ED from 03/16/2021 in [facility]  C-SSRS RISK CATEGORY No Risk No Risk        Assessment and Plan: Patient is a 41 year old female with 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 Patient still has neurovegetative symptoms of depression with psychosis although she reports improvement in her mood.  Psychosocial stressors include multiple medical problems , not getting along with family and lack of good support system.  Will increase Seroquel for depression, insomnia and psychosis.    MDD with psychosis GAD -Continue Cymbalta to 30 mg twice daily. 30-day prescription with 2 refills sent to her pharmacy. -Increase Seroquel 50  mg nightly for mood stability, insomnia, irritability and psychosis.(QTc 441 ).30-day prescription with 2 refills sent to her pharmacy.  Multiple Medical issues Follow-up with family medicine clinic   Follow-up In 6 weeks   Collaboration of Care: Collaboration of Care: PCP notes  Patient/Guardian was advised Release of Information must be obtained prior to any record release in order to collaborate their care with an outside  provider. Patient/Guardian was advised if they have not already done so to contact the registration department to sign all necessary forms in order for 41 to release information regarding their care.   Consent: Patient/Guardian gives verbal consent for treatment and assignment of benefits for services provided during this visit. Patient/Guardian expressed understanding and agreed to proceed.    [facility], MD 11/02/2021, 3:00 PM

## 2021-11-06 ENCOUNTER — Ambulatory Visit (INDEPENDENT_AMBULATORY_CARE_PROVIDER_SITE_OTHER): Payer: Medicaid Other | Admitting: Podiatry

## 2021-11-06 DIAGNOSIS — Z91199 Patient's noncompliance with other medical treatment and regimen due to unspecified reason: Secondary | ICD-10-CM

## 2021-11-07 NOTE — Progress Notes (Signed)
Patient was no-show for appointment today 

## 2021-11-07 NOTE — Progress Notes (Signed)
    SUBJECTIVE:   CHIEF COMPLAINT / HPI:   Trigger point injections Ms. Meek presents for trigger point injections of her posterior shoulders. She is in good health today, no recent illness. She has signed consent and wishes to proceed.   PERTINENT  PMH / PSH: HTN, chronic myofascial pain, cervical spondylosis, HLD, depressed mood  OBJECTIVE:   BP 134/84   Pulse 86   Ht 5' (1.524 m)   Wt 181 lb 6.4 oz (82.3 kg)   SpO2 100%   BMI 35.43 kg/m    Physical Exam General: Awake, alert, oriented, no acute distress Respiratory: Unlabored respirations, speaking in full sentences, no respiratory distress Extremities: Moving all extremities spontaneously  PROCEDURE: INJECTION: Patient was given informed consent, signed copy in the chart. Appropriate time out was taken. Three trigger points were identified across posterior shoulders. Area prepped and draped in usual sterile fashion. A 21 gauge 1 inch needle was used. 1% lidocaine without epinephrine was injected into the trigger points; 2 cc, 1 cc, and 0.5 cc of lidocaine used in each trigger point respectively.   The patient tolerated the procedure well. There were no complications. Post procedure instructions were given.  ASSESSMENT/PLAN:   Chronic myofascial pain Successful trigger point injection of 3 trigger points. Patient tolerated well, no immediate adverse reactions observed. Patient expressed some relief prior to leaving exam room. Return precautions given, see AVS for more.      Fayette Pho, MD [facility]

## 2021-11-09 ENCOUNTER — Ambulatory Visit: Payer: Medicaid Other | Admitting: Family Medicine

## 2021-11-09 ENCOUNTER — Encounter: Payer: Self-pay | Admitting: Family Medicine

## 2021-11-09 DIAGNOSIS — M7918 Myalgia, other site: Secondary | ICD-10-CM

## 2021-11-09 DIAGNOSIS — G8929 Other chronic pain: Secondary | ICD-10-CM | POA: Diagnosis not present

## 2021-11-09 NOTE — Patient Instructions (Signed)
It was wonderful to see you today. Thank you for allowing me to be a part of your care. Below is a short summary of what we discussed at your visit today:  Trigger point injections Today we did trigger point injections.  I injected a small amount of lidocaine into the tender areas that we identified at your last visit.  This should provide some relief within the next 1 to 3 days.  You may ice the areas and massage them to provide extra relief.  Please come back for a follow-up pain appointment. Please bring all your medications to this appointment.    Please bring all of your medications to every appointment!  If you have any questions or concerns, please do not hesitate to contact us via phone or MyChart message.   Fayette Pho, MD

## 2021-11-13 NOTE — Assessment & Plan Note (Signed)
Successful trigger point injection of 3 trigger points. Patient tolerated well, no immediate adverse reactions observed. Patient expressed some relief prior to leaving exam room. Return precautions given, see AVS for more.

## 2021-11-19 ENCOUNTER — Other Ambulatory Visit: Payer: Self-pay

## 2021-11-19 DIAGNOSIS — K5900 Constipation, unspecified: Secondary | ICD-10-CM

## 2021-11-19 DIAGNOSIS — M545 Low back pain, unspecified: Secondary | ICD-10-CM

## 2021-11-19 DIAGNOSIS — I10 Essential (primary) hypertension: Secondary | ICD-10-CM

## 2021-11-19 DIAGNOSIS — I671 Cerebral aneurysm, nonruptured: Secondary | ICD-10-CM

## 2021-11-19 DIAGNOSIS — M25561 Pain in right knee: Secondary | ICD-10-CM

## 2021-11-19 NOTE — Telephone Encounter (Signed)
Patient calls nurse line requesting PCP to send "all of her medications" over.   Patient is unsure what she is supposed to be taking.   Patient reports the only medications she has recently picked up are Seroquel and Cymbalta.   Will forward to PCP to review list.

## 2021-11-20 ENCOUNTER — Other Ambulatory Visit: Payer: Self-pay | Admitting: Family Medicine

## 2021-11-20 ENCOUNTER — Other Ambulatory Visit (HOSPITAL_COMMUNITY): Payer: Self-pay

## 2021-11-20 DIAGNOSIS — R7989 Other specified abnormal findings of blood chemistry: Secondary | ICD-10-CM

## 2021-11-20 DIAGNOSIS — E782 Mixed hyperlipidemia: Secondary | ICD-10-CM

## 2021-11-20 MED ORDER — SENNA 8.6 MG PO TABS
1.0000 | ORAL_TABLET | Freq: Every day | ORAL | 2 refills | Status: DC
Start: 2021-11-20 — End: 2022-08-27

## 2021-11-20 MED ORDER — LIDOCAINE 5 % EX OINT: TOPICAL_OINTMENT | CUTANEOUS | 3 refills | Status: DC

## 2021-11-20 MED ORDER — ROSUVASTATIN CALCIUM 10 MG PO TABS: 10.0000 mg | ORAL_TABLET | Freq: Every day | ORAL | 2 refills | Status: DC

## 2021-11-20 MED ORDER — DOCUSATE SODIUM 250 MG PO CAPS: 250.0000 mg | ORAL_CAPSULE | Freq: Every day | ORAL | 3 refills | Status: AC

## 2021-11-20 MED ORDER — FAMOTIDINE 20 MG PO TABS
20.0000 mg | ORAL_TABLET | Freq: Every day | ORAL | 3 refills | Status: AC
Start: 2021-11-20 — End: 2022-11-15

## 2021-11-20 MED ORDER — VERAPAMIL HCL ER 120 MG PO TBCR: 120.0000 mg | EXTENDED_RELEASE_TABLET | Freq: Every day | ORAL | 2 refills | Status: DC

## 2021-11-20 MED ORDER — ALBUTEROL SULFATE HFA 108 (90 BASE) MCG/ACT IN AERS
2.0000 | INHALATION_SPRAY | Freq: Four times a day (QID) | RESPIRATORY_TRACT | 3 refills | Status: DC | PRN
Start: 2021-11-20 — End: 2022-07-18

## 2021-11-20 MED ORDER — CYCLOBENZAPRINE HCL 5 MG PO TABS: 5.0000 mg | ORAL_TABLET | Freq: Three times a day (TID) | ORAL | 0 refills | Status: DC | PRN

## 2021-11-20 MED ORDER — CAPSAICIN-MENTHOL-METHYL SAL 0.001-10-20 % EX LOTN: TOPICAL_LOTION | CUTANEOUS | 3 refills | Status: DC

## 2021-11-20 MED ORDER — LISINOPRIL 10 MG PO TABS: 10.0000 mg | ORAL_TABLET | Freq: Every day | ORAL | 3 refills | Status: DC

## 2021-11-20 NOTE — Telephone Encounter (Signed)
[name] test claim returns last fill date of 08/22/2021 for 90 tablets. Medication has been approved with this insurance plan.  Clista Bernhardt, CPhT Rx Patient Advocate Phone: [phone]
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
## 2021-11-20 NOTE — Progress Notes (Signed)
Took meloxicam and naproxen off her list. NSAIDS CONTRAINDICATED IN THIS PATIENT due to cerebral aneurysm and fibromuscular dysplasia.  Refilled flexeril, capsaicin cream, famotidine, lidocaine ointment, lisinopril, senna, and rosuvastatin.   Did not refill zonisamide, verapamil, and ubrogepant. These refill requests will need to be sent to her neurologist, Dr. Tomi Likens.   Did not refill Linzess. Will need to be sent by GI Dr. Lorenso Courier.   Did not fill vit. D script. Will need labs first to establish ongoing deficiency.   Ordered future lipid panel and vit. D. Current ASCVD 6.4% despite elevated LDL. Will adjust statin based on results from lipid panel.   Ezequiel Essex, MD   The 10-year ASCVD risk score (Arnett DK, et al., 2019) is: 6.4%   Values used to calculate the score:     Age: 41 years     Sex: Female     Is Non-Hispanic African American: Yes     Diabetic: No     Tobacco smoker: No     Systolic Blood Pressure: 034 mmHg     Is BP treated: Yes     HDL Cholesterol: 35 mg/dL     Total Cholesterol: 226 mg/dL

## 2021-11-20 NOTE — Patient Instructions (Addendum)
I am sorry that you are not feeling well.   This is most likely a viral infection. We are doing tests to check for COVID and influenza. I will let you know if these tests are positive. This will take time to get over. The treatment for this is supportive care. You can take Tylenol every 6 hours. You can use cough drops, sore throat lozenges to help with your symptoms. Honey can help with cough. Steam baths, Vicks vapor rub, a humidifier and nasal saline spray can help with congestion.   It is important to stay hydrated throughout this time!  Frequent hand washing to prevent recurrent illnesses is important.   Please seek immediate care if you have difficulty breathing, are unable to keep hydrated or have worsening chest pain or any other symptoms concerning to you.   We would recommend the flu vaccine once you are recovered.

## 2021-11-20 NOTE — Progress Notes (Signed)
    SUBJECTIVE:   CHIEF COMPLAINT / HPI:   Christine Gordon is a 41 y.o. female who presents to the [facility] clinic today to discuss the following concerns:   Sick Symptoms States that since Sunday night she started to have chills. She then had a headache. Her throat is feeling sore, it is painful to swallow. Also has had some diarrhea. Has had a slight cough. States that mucus is yellow but this morning it was blood-tinged. She is having difficulty with tasting and smelling (started on Monday). She thinks that she may have had a fever on Monday though she was unable to check her temperature. Her appetite has been decreased. She has felt weak. Has been drinking fluids. Has had some nausea but no vomiting. Feels dizzy when standing. Endorses body aches. No sick contacts. Feels "somewhat" short of breath. Has used albuterol. Has had some sharp central chest pain.   Has had COVID in the past- felt similar. She has had two doses of Moderna vaccines.   Hasn't taken any medications for her symptoms thus far.   PERTINENT  PMH / PSH: Tobacco use disorder, HTN, OSA   OBJECTIVE:   BP (!) 129/92   Pulse 100   Ht 5' (1.524 m)   Wt 176 lb 12.8 oz (80.2 kg)   SpO2 100%   BMI 34.53 kg/m    General: NAD, pleasant, able to participate in exam HEENT: Normocephalic, sclera anicteric, TM clear bilaterally, nares patent, oropharynx clear, no tonsillar erythema or exudates Neck: Supple, no cervical adenopathy Cardiac: RRR, no murmurs. Respiratory: CTAB, normal effort, No wheezes, rales or rhonchi. On room air Abdomen: Bowel sounds present, nontender, nondistended Extremities: no edema or cyanosis. Skin: warm and dry, no rashes noted Psych: Normal affect and mood  ASSESSMENT/PLAN:   Viral illness Symptoms appear consistent with viral illness.  Performed testing for COVID and flu today. She has received 2 COVID vaccinations. Would still be in the window for Paxlovid treatment till tomorrow.  Examination was   Unremarkable.  Her lungs are clear, she was saturating 100% on room air.  Discussed conservative and supportive measures.  Advised her to wear a mask.  Work note provided to remain off work until Monday.  Return precautions discussed.   Sharion Settler, [place]

## 2021-11-21 ENCOUNTER — Encounter: Payer: Self-pay | Admitting: Internal Medicine

## 2021-11-21 ENCOUNTER — Ambulatory Visit (INDEPENDENT_AMBULATORY_CARE_PROVIDER_SITE_OTHER)
Admission: RE | Admit: 2021-11-21 | Discharge: 2021-11-21 | Disposition: A | Discharge status: Home / Self Care | Payer: Medicaid Other | Source: Ambulatory Visit | Attending: Internal Medicine | Admitting: Internal Medicine

## 2021-11-21 ENCOUNTER — Ambulatory Visit (INDEPENDENT_AMBULATORY_CARE_PROVIDER_SITE_OTHER): Payer: Medicaid Other | Admitting: Family Medicine

## 2021-11-21 ENCOUNTER — Ambulatory Visit: Payer: Medicaid Other | Admitting: Internal Medicine

## 2021-11-21 VITALS — BP 129/92 | HR 100 | Ht 60.0 in | Wt 176.8 lb

## 2021-11-21 VITALS — BP 140/100 | HR 105 | Ht 60.0 in | Wt 178.2 lb

## 2021-11-21 DIAGNOSIS — R140 Abdominal distension (gaseous): Secondary | ICD-10-CM

## 2021-11-21 DIAGNOSIS — K5904 Chronic idiopathic constipation: Secondary | ICD-10-CM | POA: Diagnosis not present

## 2021-11-21 DIAGNOSIS — K219 Gastro-esophageal reflux disease without esophagitis: Secondary | ICD-10-CM

## 2021-11-21 DIAGNOSIS — R6883 Chills (without fever): Secondary | ICD-10-CM

## 2021-11-21 DIAGNOSIS — J3489 Other specified disorders of nose and nasal sinuses: Secondary | ICD-10-CM | POA: Diagnosis not present

## 2021-11-21 DIAGNOSIS — B349 Viral infection, unspecified: Secondary | ICD-10-CM | POA: Insufficient documentation

## 2021-11-21 DIAGNOSIS — R109 Unspecified abdominal pain: Secondary | ICD-10-CM | POA: Diagnosis not present

## 2021-11-21 HISTORY — DX: Viral infection, unspecified: B34.9

## 2021-11-21 MED ORDER — SORE THROAT LOZENGES 6-10 MG MT LOZG: 1.0000 | LOZENGE | OROMUCOSAL | 0 refills | Status: DC | PRN

## 2021-11-21 MED ORDER — GAS-X PREVENTION PO CAPS: 80.0000 | ORAL_CAPSULE | ORAL | 1 refills | Status: AC

## 2021-11-21 MED ORDER — LINACLOTIDE 290 MCG PO CAPS: 290.0000 ug | ORAL_CAPSULE | Freq: Every day | ORAL | 2 refills | Status: DC

## 2021-11-21 MED ORDER — BENZONATATE 100 MG PO CAPS: 100.0000 mg | ORAL_CAPSULE | Freq: Three times a day (TID) | ORAL | 0 refills | Status: DC | PRN

## 2021-11-21 MED ORDER — OMEPRAZOLE 40 MG PO CPDR: 40.0000 mg | DELAYED_RELEASE_CAPSULE | Freq: Every day | ORAL | 3 refills | Status: AC

## 2021-11-21 MED ORDER — SALINE SPRAY 0.65 % NA SOLN: 1.0000 | NASAL | 0 refills | Status: DC | PRN

## 2021-11-21 MED ORDER — GUAIFENESIN ER 600 MG PO TB12: 600.0000 mg | ORAL_TABLET | Freq: Two times a day (BID) | ORAL | 0 refills | Status: AC

## 2021-11-21 MED ORDER — ACETAMINOPHEN ER 650 MG PO TBCR: 650.0000 mg | EXTENDED_RELEASE_TABLET | Freq: Four times a day (QID) | ORAL | 1 refills | Status: AC | PRN

## 2021-11-21 NOTE — Assessment & Plan Note (Addendum)
Symptoms appear consistent with viral illness.  Performed testing for COVID and flu today. She has received 2 COVID vaccinations. Would still be in the window for Paxlovid treatment till tomorrow.  Examination was  Unremarkable.  Her lungs are clear, she was saturating 100% on room air.  Discussed conservative and supportive measures.  Advised her to wear a mask.  Work note provided to remain off work until Monday.  Return precautions discussed.

## 2021-11-21 NOTE — Progress Notes (Signed)
11/21/2021 Christine Gordon [id number] Aug 21, 1980   ASSESSMENT AND PLAN:   Christine Gordon was seen today for bloating and abdominal pain.  Diagnoses and all orders for this visit:  Chronic idiopathic constipation - Refilled Linzess 290 mcg QD - Check Abdominal X-ray 2 view to see if the patient has residual constipation - RTC 6 weeks  Abdominal bloating. Patient's main concern, which may be due to constipation or diet. Will also plan to rule out SIBO as a contributor. - Gas-X PRN - Check SIBO breath test - Continue low FODMAP diet  GERD - Stop famotidine - Start omeprazole 40 mg QD  Ventral Hernia/Diastasis Recti  - Appears stable and soft    Future Appointments  Date Time Provider Department Center  11/21/2021  2:30 PM Imogene Burn, MD [facility]  11/23/2021 10:10 AM Drema Dallas, DO [facility] None  11/26/2021  3:00 PM Edwin Cap, DPM [facility]  12/17/2021  1:30 PM Karsten Ro, MD [facility] None  12/18/2021 11:00 AM Cheri Fowler, [facility] None    Patient Care Team: Fayette Pho, MD as PCP - General (Family Medicine) [facility] as Nurse Practitioner (Family Medicine) [place], Elisha Headland, MD as Consulting Physician (Neurology) Stephenie Acres, MD as Consulting Physician (Neurosurgery) Keith Rake, MD as Consulting Physician (Neurosurgery) Dorena Dew, MD as Consulting Physician (Physical Medicine and Rehabilitation)  HISTORY OF PRESENT ILLNESS: 41 y.o. AA female referred by Fayette Pho, MD, with a past medical history of hypertension, migraine, sleep apnea, obesity presents for follow up of bloating  Interval History: Denies N&V. Endorses some chest burning and regurgitation. Does feel full pretty easily. Will have 5-6 watery BMs after taking a daily dose of Linzess. Bloating is about the same as when I last saw her. She is drinking adequate amounts of water and trying to follow a healthy  diet. She is not moving around that much.  Labs and notes reviewed: Labs 04/2021: CBC and CMP unremarkable. TSH nml. TTG IgA negative. IgA nml.  Labs 08/2021: CBC and CMP unremarkable.  CT A/P w/contrast 04/30/21: IMPRESSION: 1. No acute abdominopelvic findings. 2. Small hiatal hernia.  Current Medications:    Current Outpatient Medications (Cardiovascular):    lisinopril (ZESTRIL) 10 MG tablet, Take 1 tablet (10 mg total) by mouth at bedtime.   rosuvastatin (CRESTOR) 10 MG tablet, Take 1 tablet (10 mg total) by mouth daily. Please come to clinic lab for cholesterol check.   verapamil (CALAN-SR) 120 MG CR tablet, Take 1 tablet (120 mg total) by mouth at bedtime.  Current Outpatient Medications (Respiratory):    albuterol (VENTOLIN HFA) 108 (90 Base) MCG/ACT inhaler, Inhale 2 puffs into the lungs every 6 (six) hours as needed.   benzonatate (TESSALON PERLES) 100 MG capsule, Take 1 capsule (100 mg total) by mouth 3 (three) times daily as needed for cough.   guaiFENesin (MUCINEX) 600 MG 12 hr tablet, Take 1 tablet (600 mg total) by mouth 2 (two) times daily.   sodium chloride (OCEAN) 0.65 % SOLN nasal spray, Place 1 spray into both nostrils as needed for congestion.  Current Outpatient Medications (Analgesics):    acetaminophen (TYLENOL 8 HOUR) 650 MG CR tablet, Take 1 tablet (650 mg total) by mouth every 6 (six) hours as needed for pain or fever.   aspirin EC 81 MG tablet, Take 81 mg by mouth daily. Swallow whole.   Ubrogepant (UBRELVY) 100 MG TABS, Medication Samples have been provided to the  patient.  Drug name: [name]       Strength: 100mg         Qty: 4  LOT: [id number]  Exp.Date: 08/2022  Dosing instructions:   The patient has been instructed regarding the correct time, dose, and frequency of taking this medication, including desired effects and most common side effects.   [name] 11:35 AM 05/22/2021   Current Outpatient Medications (Other):    benzocaine-menthol (SORE THROAT  LOZENGES) 6-10 MG lozenge, Take 1 lozenge by mouth as needed for sore throat.   Capsaicin-Menthol-Methyl Sal 0.001-10-20 % LOTN, Apply to knee daily for pain relief.   Cholecalciferol 125 MCG (5000 UT) TABS, Take by mouth.   cyclobenzaprine (FLEXERIL) 5 MG tablet, Take 1 tablet (5 mg total) by mouth 3 (three) times daily as needed.   docusate sodium (COLACE) 250 MG capsule, Take 1 capsule (250 mg total) by mouth daily.   DULoxetine (CYMBALTA) 30 MG capsule, Take 1 capsule (30 mg total) by mouth 2 (two) times daily.   famotidine (PEPCID) 20 MG tablet, Take 1 tablet (20 mg total) by mouth at bedtime.   lidocaine (XYLOCAINE) 5 % ointment, APPLY TOPICALLY 1 APPLICATION AS NEEDED   linaclotide (LINZESS) 290 MCG CAPS capsule, Take 1 capsule (290 mcg total) by mouth daily before breakfast.   QUEtiapine (SEROQUEL) 50 MG tablet, Take 1 tablet (50 mg total) by mouth at bedtime.   senna (SENOKOT) 8.6 MG TABS tablet, Take 1 tablet (8.6 mg total) by mouth daily.   Spacer/Aero-Holding Chambers (EASIVENT) inhaler, Use as instructed with inhaler.   zonisamide (ZONEGRAN) 100 MG capsule, Take 1 capsule (100 mg total) by mouth daily.  PHYSICAL EXAM: Ht 5' (1.524 m)   Wt 178 lb 4 oz (80.9 kg)   BMI 34.81 kg/m  General:   Pleasant, well developed female in no acute distress Head:  Normocephalic and atraumatic. Eyes: sclerae anicteric,conjunctive pink  Heart:  regular rate Pulm: No increased WOB Abdomen:  diastasis/ventral hernia, non-tender,  distended, tympanic Extremities:  Without edema. Msk:  Symmetrical without gross deformities. Peripheral pulses intact.  Neurologic:  Alert and  oriented x4;  grossly normal neurologically. Skin:   Dry and intact without significant lesions or rashes. Psychiatric: Demonstrates good judgement and reason without abnormal affect or behaviors.   Sharyn Creamer, MD 1:39 PM

## 2021-11-21 NOTE — Patient Instructions (Addendum)
If you are age 41 or older, your body mass index should be between 23-30. Your Body mass index is 34.81 kg/m. If this is out of the aforementioned range listed, please consider follow up with your Primary Care Provider.  If you are age 64 or younger, your body mass index should be between 19-25. Your Body mass index is 34.81 kg/m. If this is out of the aformentioned range listed, please consider follow up with your Primary Care Provider.   You have been given a testing kit to check for small intestine bacterial overgrowth (SIBO) which is completed by a company named [employer]. Make sure to return your test in the mail using the return mailing label given to you along with the kit. Your demographic and insurance information have already been sent to the company and they should be in contact with you over the next 1-2 weeks regarding this test. [facility] will collect an upfront charge of $99.74 for commercial insurance plans and $209.74 is you are paying cash. Make sure to discuss with [facility] PRIOR to having the test to see if they have gotten information from your insurance company as to how much your testing will cost out of pocket, if any. Please keep in mind that you will be getting a call from phone number [phone] or a similar number. If you do not hear from them within this time frame, please call our office at [phone] or call [facility] directly at [phone].    Your provider has requested that you have an abdominal x ray before leaving today. Please go to the basement floor to our Radiology department for the test.   We have sent the following medications to your pharmacy for you to pick up at your convenience:  omeprazole 40 mg daily 30 minutes before breakfast.  The [facility] providers would like to encourage you to use MYCHART to communicate with providers for non-urgent requests or questions.  Due to long hold times on the telephone, sending your  provider a message by MYCHART may be a faster and more efficient way to get a response.  Please allow 48 business hours for a response.  Please remember that this is for non-urgent requests.   It was a pleasure to see you today!  Thank you for trusting me with your gastrointestinal care!                                                                                                                                                                                                                                                                       

## 2021-11-23 ENCOUNTER — Encounter: Payer: Self-pay | Admitting: Neurology

## 2021-11-23 ENCOUNTER — Ambulatory Visit (INDEPENDENT_AMBULATORY_CARE_PROVIDER_SITE_OTHER): Payer: Medicaid Other | Admitting: Neurology

## 2021-11-23 VITALS — BP 167/117 | HR 102 | Ht 64.0 in | Wt 178.0 lb

## 2021-11-23 DIAGNOSIS — G43011 Migraine without aura, intractable, with status migrainosus: Secondary | ICD-10-CM | POA: Diagnosis not present

## 2021-11-23 DIAGNOSIS — M7918 Myalgia, other site: Secondary | ICD-10-CM | POA: Diagnosis not present

## 2021-11-23 DIAGNOSIS — G8929 Other chronic pain: Secondary | ICD-10-CM

## 2021-11-23 DIAGNOSIS — I671 Cerebral aneurysm, nonruptured: Secondary | ICD-10-CM

## 2021-11-23 DIAGNOSIS — I773 Arterial fibromuscular dysplasia: Secondary | ICD-10-CM | POA: Diagnosis not present

## 2021-11-23 LAB — COVID-19, FLU A+B NAA
Influenza A, NAA: NOT DETECTED
Influenza B, NAA: NOT DETECTED
SARS-CoV-2, NAA: DETECTED — AB

## 2021-11-23 MED ORDER — EMGALITY 120 MG/ML ~~LOC~~ SOAJ: 240.0000 mg | Freq: Once | SUBCUTANEOUS | 0 refills | Status: AC

## 2021-11-23 MED ORDER — EMGALITY 120 MG/ML ~~LOC~~ SOAJ: 120.0000 mg | SUBCUTANEOUS | 5 refills | Status: DC

## 2021-11-23 NOTE — Patient Instructions (Addendum)
Start emgality - 2 shots for first dose, then 1 shot every 28 days thereafter.  Continue zonisamide for now. Take [name] earliest onset of migraine.  May repeat after 2 hours (maximum 2 tablets in 24 hours).  Alternatively, try Nurtec once for migraine attack (one in 24 hours).  Let me know if the Nurtec works better and I can send prescription.   Check CTA head to follow up on aneurysm Follow up 4 months.

## 2021-11-23 NOTE — Progress Notes (Signed)
Telephone call to patient, BP too high did not recheck at office today. Patient needs to return to the office or go the Urgent Care to make sure BP has decreased. Patient opted to go to Urgent Care near her home too far to return to office.

## 2021-11-26 ENCOUNTER — Ambulatory Visit: Payer: Medicaid Other | Admitting: Podiatry

## 2021-11-27 ENCOUNTER — Telehealth: Payer: Self-pay

## 2021-11-27 NOTE — Telephone Encounter (Signed)
Telephone call to patient on 11/26/21, To see how she was doing and if she went to Urgent care as Dr.Jaffe advised.   Per Patient she never made it and she felt better the next day. Advised patient to please have someone check her BP to make sure it has went down. Please advise her PCP of the high BP.  And if she feels like she did on Friday or worse please GO to the ED.

## 2021-12-05 ENCOUNTER — Telehealth: Payer: Self-pay | Admitting: Pharmacy Technician

## 2021-12-05 NOTE — Telephone Encounter (Signed)
Submitted a Prior Authorization request to  [facility]  for  Emgality 120mg   via CoverMyMeds. Will update once we receive a response.   Key: Chriss Driver

## 2021-12-05 NOTE — Telephone Encounter (Signed)
Patient Advocate Encounter  Prior Authorization for Emgality 120mg /mL pen has been approved.     Effective: 12-05-2021 to 03-05-2022

## 2021-12-05 NOTE — Telephone Encounter (Signed)
Pharmacy advised of Approval.

## 2021-12-13 ENCOUNTER — Other Ambulatory Visit: Payer: Self-pay | Admitting: Family Medicine

## 2021-12-14 ENCOUNTER — Telehealth (HOSPITAL_COMMUNITY): Payer: Medicaid Other | Admitting: Psychiatry

## 2021-12-17 ENCOUNTER — Ambulatory Visit (HOSPITAL_COMMUNITY): Payer: Medicaid Other | Admitting: Licensed Clinical Social Worker

## 2021-12-17 ENCOUNTER — Other Ambulatory Visit: Payer: Self-pay | Admitting: Family Medicine

## 2021-12-17 ENCOUNTER — Telehealth (HOSPITAL_COMMUNITY): Payer: Medicaid Other | Admitting: Psychiatry

## 2021-12-17 DIAGNOSIS — Z1231 Encounter for screening mammogram for malignant neoplasm of breast: Secondary | ICD-10-CM

## 2021-12-18 ENCOUNTER — Ambulatory Visit (HOSPITAL_COMMUNITY): Payer: Medicaid Other | Admitting: Licensed Clinical Social Worker

## 2021-12-20 ENCOUNTER — Ambulatory Visit
Admission: RE | Admit: 2021-12-20 | Discharge: 2021-12-20 | Disposition: A | Discharge status: Home / Self Care | Payer: Medicaid Other | Source: Ambulatory Visit | Attending: Neurology | Admitting: Neurology

## 2021-12-20 DIAGNOSIS — I671 Cerebral aneurysm, nonruptured: Secondary | ICD-10-CM

## 2021-12-20 MED ORDER — IOPAMIDOL (ISOVUE-370) INJECTION 76%
75.0000 mL | Freq: Once | INTRAVENOUS | Status: AC | PRN
  Administered 2021-12-20: 75 mL via INTRAVENOUS

## 2021-12-24 ENCOUNTER — Telehealth: Payer: Self-pay

## 2021-12-24 ENCOUNTER — Encounter: Payer: Self-pay | Admitting: Neurology

## 2021-12-24 ENCOUNTER — Ambulatory Visit (INDEPENDENT_AMBULATORY_CARE_PROVIDER_SITE_OTHER): Payer: Medicaid Other

## 2021-12-24 ENCOUNTER — Ambulatory Visit: Payer: Medicaid Other | Admitting: Podiatry

## 2021-12-24 DIAGNOSIS — M7752 Other enthesopathy of left foot: Secondary | ICD-10-CM

## 2021-12-24 DIAGNOSIS — B353 Tinea pedis: Secondary | ICD-10-CM

## 2021-12-24 DIAGNOSIS — I671 Cerebral aneurysm, nonruptured: Secondary | ICD-10-CM

## 2021-12-24 DIAGNOSIS — M778 Other enthesopathies, not elsewhere classified: Secondary | ICD-10-CM

## 2021-12-24 NOTE — Telephone Encounter (Signed)
Per Dr.Jaffe, In the past year, there has been some increase in size of the aneurysm.  Therefore, I would like to refer her to endovascular radiology for further evaluation.

## 2021-12-24 NOTE — Progress Notes (Signed)
  Subjective:  Patient ID: Christine Gordon, female    DOB: Jul 13, 1980,  MRN: [id number]  Chief Complaint  Patient presents with   Foot Pain    Top of foot pain, off and on for a few years. Several heavy people have stepped on that foot over the years   Tinea Pedis    Peeling, dark skin between toes on both feet    41 y.o. female presents with the above complaint. History confirmed with patient.  She complains primarily of these 2 separate issues.  The itching scaling skin and peeling has been going on for some time is getting worse especially the left foot.  Does not have much lotion she uses.  Regarding the pain in the left foot she never had treatment for this denies any casting or immobilization in a boot or surgery.  Objective:  Physical Exam: warm, good capillary refill, no trophic changes or ulcerative lesions, normal DP and PT pulses, normal sensory exam, tinea pedis, and diffuse midfoot pain and edema on the left area over the TN joint, mild in nature, good range of motion of subtalar and midfoot joints   Radiographs: Multiple views x-ray of the left foot: Possible joint space narrowing of lateral portion of intermediate lateral navicular cuneiform joint Assessment:   1. Capsulitis of foot, left   2. Tinea pedis of both feet      Plan:  Patient was evaluated and treated and all questions answered.  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  No evidence of Lisfranc injury.  Discussed treatment with anti-inflammatories and possible corticosteroid injection.  She is a contraindication to NSAIDs due to current cerebral aneurysm.  I discussed with her the option of a methylprednisolone taper, we reviewed its use and possible side effects this was sent to her pharmacy   Discussed the etiology and treatment options for tinea pedis.  Discussed topical and oral treatment.  Recommended topical treatment with 2% ketoconazole cream.  This was  sent to the patient's pharmacy.  Also discussed appropriate foot hygiene, use of antifungal spray such as Tinactin in shoes, as well as cleaning her foot surfaces such as showers and bathroom floors with bleach.   No follow-ups on file.

## 2021-12-25 ENCOUNTER — Telehealth: Payer: Self-pay | Admitting: Neurology

## 2021-12-25 MED ORDER — METHYLPREDNISOLONE 4 MG PO TBPK: ORAL_TABLET | ORAL | 0 refills | Status: DC

## 2021-12-25 MED ORDER — KETOCONAZOLE 2 % EX CREA: 1.0000 | TOPICAL_CREAM | Freq: Every day | CUTANEOUS | 2 refills | Status: DC

## 2021-12-25 NOTE — Telephone Encounter (Signed)
Patient left a VM today about speaking to someone about her emgality that needs to have a prior [place]

## 2021-12-26 NOTE — Telephone Encounter (Signed)
Patient advised, Office called the pharmacy on 10/4 with approval.

## 2021-12-27 ENCOUNTER — Other Ambulatory Visit: Payer: Self-pay | Admitting: Family Medicine

## 2021-12-27 ENCOUNTER — Other Ambulatory Visit: Payer: Self-pay | Admitting: Neurology

## 2021-12-27 DIAGNOSIS — I10 Essential (primary) hypertension: Secondary | ICD-10-CM

## 2021-12-31 NOTE — Telephone Encounter (Signed)
Patient left a message saying she has not received a call yet about her referral. Also that she has not been able to get her Emgality because her pharmacy does not have prescription.

## 2021-12-31 NOTE — Telephone Encounter (Signed)
Per [facility] PA needed.  Advised rep PA approved 12/05/21.  Will try to run again. Per Rep still needed PA for loading dose.   Rep with [facility] will check on it and get back to [place]. Will give patient samples.   Patient will stop by tomorrow after work office hours and address given to patient.

## 2022-01-01 ENCOUNTER — Telehealth: Payer: Self-pay

## 2022-01-01 DIAGNOSIS — I671 Cerebral aneurysm, nonruptured: Secondary | ICD-10-CM

## 2022-01-01 NOTE — Telephone Encounter (Signed)
Per Patient she would like to have her Interventional Radiology referral sent to [facility].  [facility] 2B/2C

## 2022-01-04 ENCOUNTER — Ambulatory Visit: Payer: Medicaid Other | Admitting: Internal Medicine

## 2022-01-04 ENCOUNTER — Emergency Department (HOSPITAL_COMMUNITY): Payer: Medicaid Other

## 2022-01-04 ENCOUNTER — Telehealth: Payer: Self-pay | Admitting: Neurology

## 2022-01-04 ENCOUNTER — Other Ambulatory Visit: Payer: Self-pay

## 2022-01-04 ENCOUNTER — Emergency Department (HOSPITAL_COMMUNITY)
Admission: EM | Admit: 2022-01-04 | Discharge: 2022-01-04 | Disposition: A | Discharge status: Home / Self Care | Payer: Medicaid Other | Attending: Emergency Medicine | Admitting: Emergency Medicine

## 2022-01-04 DIAGNOSIS — I10 Essential (primary) hypertension: Secondary | ICD-10-CM | POA: Diagnosis not present

## 2022-01-04 DIAGNOSIS — Z79899 Other long term (current) drug therapy: Secondary | ICD-10-CM | POA: Diagnosis not present

## 2022-01-04 DIAGNOSIS — Z8673 Personal history of transient ischemic attack (TIA), and cerebral infarction without residual deficits: Secondary | ICD-10-CM | POA: Diagnosis not present

## 2022-01-04 DIAGNOSIS — G43809 Other migraine, not intractable, without status migrainosus: Secondary | ICD-10-CM | POA: Diagnosis not present

## 2022-01-04 DIAGNOSIS — Z7982 Long term (current) use of aspirin: Secondary | ICD-10-CM | POA: Insufficient documentation

## 2022-01-04 DIAGNOSIS — R531 Weakness: Secondary | ICD-10-CM | POA: Diagnosis present

## 2022-01-04 LAB — DIFFERENTIAL
Abs Immature Granulocytes: 0.02 10*3/uL (ref 0.00–0.07)
Basophils Absolute: 0 10*3/uL (ref 0.0–0.1)
Basophils Relative: 0 %
Eosinophils Absolute: 0.3 10*3/uL (ref 0.0–0.5)
Eosinophils Relative: 3 %
Immature Granulocytes: 0 %
Lymphocytes Relative: 39 %
Lymphs Abs: 3.8 10*3/uL (ref 0.7–4.0)
Monocytes Absolute: 0.7 10*3/uL (ref 0.1–1.0)
Monocytes Relative: 7 %
Neutro Abs: 4.8 10*3/uL (ref 1.7–7.7)
Neutrophils Relative %: 51 %

## 2022-01-04 LAB — I-STAT CHEM 8, ED
BUN: 13 mg/dL (ref 6–20)
Calcium, Ion: 1.01 mmol/L — ABNORMAL LOW (ref 1.15–1.40)
Chloride: 106 mmol/L (ref 98–111)
Creatinine, Ser: 0.7 mg/dL (ref 0.44–1.00)
Glucose, Bld: 91 mg/dL (ref 70–99)
HCT: 38 % (ref 36.0–46.0)
Hemoglobin: 12.9 g/dL (ref 12.0–15.0)
Potassium: 4 mmol/L (ref 3.5–5.1)
Sodium: 139 mmol/L (ref 135–145)
TCO2: 24 mmol/L (ref 22–32)

## 2022-01-04 LAB — ETHANOL: Alcohol, Ethyl (B): <10 mg/dL (ref <10)

## 2022-01-04 LAB — COMPREHENSIVE METABOLIC PANEL
ALT: 15 U/L (ref 0–44)
AST: 15 U/L (ref 15–41)
Albumin: 3.5 g/dL (ref 3.5–5.0)
Alkaline Phosphatase: 59 U/L (ref 38–126)
Anion gap: 7 (ref 5–15)
BUN: 12 mg/dL (ref 6–20)
CO2: 25 mmol/L (ref 22–32)
Calcium: 8.8 mg/dL — ABNORMAL LOW (ref 8.9–10.3)
Chloride: 107 mmol/L (ref 98–111)
Creatinine, Ser: 0.81 mg/dL (ref 0.44–1.00)
GFR, Estimated: >60 mL/min (ref >60)
Glucose, Bld: 94 mg/dL (ref 70–99)
Potassium: 4 mmol/L (ref 3.5–5.1)
Sodium: 139 mmol/L (ref 135–145)
Total Bilirubin: 0.3 mg/dL (ref 0.3–1.2)
Total Protein: 6 g/dL — ABNORMAL LOW (ref 6.5–8.1)

## 2022-01-04 LAB — PROTIME-INR
INR: 1 (ref 0.8–1.2)
Prothrombin Time: 13.3 seconds (ref 11.4–15.2)

## 2022-01-04 LAB — CBC
HCT: 36.7 % (ref 36.0–46.0)
Hemoglobin: 12.6 g/dL (ref 12.0–15.0)
MCH: 32.1 pg (ref 26.0–34.0)
MCHC: 34.3 g/dL (ref 30.0–36.0)
MCV: 93.6 fL (ref 80.0–100.0)
Platelets: 260 10*3/uL (ref 150–400)
RBC: 3.92 MIL/uL (ref 3.87–5.11)
RDW: 12.5 % (ref 11.5–15.5)
WBC: 9.6 10*3/uL (ref 4.0–10.5)
nRBC: 0 % (ref 0.0–0.2)

## 2022-01-04 LAB — APTT: aPTT: 26 seconds (ref 24–36)

## 2022-01-04 LAB — I-STAT BETA HCG BLOOD, ED (MC, WL, AP ONLY): I-stat hCG, quantitative: <5 m[IU]/mL (ref <5)

## 2022-01-04 LAB — CBG MONITORING, ED: Glucose-Capillary: 95 mg/dL (ref 70–99)

## 2022-01-04 MED ORDER — SODIUM CHLORIDE 0.9 % IV BOLUS
1000.0000 mL | Freq: Once | INTRAVENOUS | Status: AC
  Administered 2022-01-04: 1000 mL via INTRAVENOUS

## 2022-01-04 MED ORDER — MORPHINE SULFATE (PF) 2 MG/ML IV SOLN
2.0000 mg | Freq: Once | INTRAVENOUS | Status: AC
  Administered 2022-01-04: 2 mg via INTRAVENOUS
  Filled 2022-01-04: qty 1

## 2022-01-04 MED ORDER — IOHEXOL 350 MG/ML SOLN
75.0000 mL | Freq: Once | INTRAVENOUS | Status: AC | PRN
  Administered 2022-01-04: 75 mL via INTRAVENOUS

## 2022-01-04 MED ORDER — PROCHLORPERAZINE EDISYLATE 10 MG/2ML IJ SOLN
10.0000 mg | Freq: Once | INTRAMUSCULAR | Status: AC
  Administered 2022-01-04: 10 mg via INTRAVENOUS
  Filled 2022-01-04: qty 2

## 2022-01-04 MED ORDER — ONDANSETRON HCL 4 MG/2ML IJ SOLN
4.0000 mg | Freq: Once | INTRAMUSCULAR | Status: AC
  Administered 2022-01-04: 4 mg via INTRAVENOUS
  Filled 2022-01-04: qty 2

## 2022-01-04 MED ORDER — SODIUM CHLORIDE 0.9% FLUSH
3.0000 mL | Freq: Once | INTRAVENOUS | Status: AC
  Administered 2022-01-04: 3 mL via INTRAVENOUS

## 2022-01-04 NOTE — ED Notes (Signed)
Discharge instructions reviewed with pt and family. Stroke symptoms and BEFAST discussed. Pt and family verbalize understanding of teaching. Pt ambulatory to car with family.

## 2022-01-04 NOTE — ED Provider Notes (Signed)
[facility] EMERGENCY DEPARTMENT Provider Note   CSN: [id number] Arrival date & time: 01/04/22  2112     History No chief complaint on file.   Christine Gordon is a 41 y.o. female with a past medical history of hypertension, recurrent migraines and right-sided MCA aneurysm presenting today with a concern for stroke.  Patient reports that earlier tonight she was going to lay down in her bed and had acute onset pain to the superior aspect of her head that radiated behind her.  Bilateral eyes.  She denies any numbness or weakness but EMS reported weakness on the left side during their initial evaluation and activated code stroke.    HPI     Home Medications Prior to Admission medications   Medication Sig Start Date End Date Taking? Authorizing Provider  acetaminophen (TYLENOL 8 HOUR) 650 MG CR tablet Take 1 tablet (650 mg total) by mouth every 6 (six) hours as needed for pain or fever. 11/21/21   Sabino Dick, DO  albuterol (VENTOLIN HFA) 108 (90 Base) MCG/ACT inhaler Inhale 2 puffs into the lungs every 6 (six) hours as needed. 11/20/21   Fayette Pho, MD  Alpha-D-Galactosidase (GAS-X PREVENTION) CAPS Take 80 capsules by mouth as directed. 11/21/21   Imogene Burn, MD  aspirin EC 81 MG tablet Take 81 mg by mouth daily. Swallow whole.    [provider]  benzonatate (TESSALON PERLES) 100 MG capsule Take 1 capsule (100 mg total) by mouth 3 (three) times daily as needed for cough. 11/21/21   Sabino Dick, DO  Capsaicin-Menthol-Methyl Sal 0.001-10-20 % LOTN Apply to knee daily for pain relief. 11/20/21   Fayette Pho, MD  Cholecalciferol 125 MCG (5000 UT) TABS Take by mouth. 09/14/20   [provider]  [facility] SALINE NASAL SPRAY 0.65 % nasal spray SPRAY 1 SPRAY INTO EACH NOSTRIL AS NEEDED FOR CONGESTION 12/13/21   Fayette Pho, MD  cyclobenzaprine (FLEXERIL) 5 MG tablet Take 1 tablet (5 mg total) by mouth 3 (three) times daily as needed. 11/20/21    Fayette Pho, MD  docusate sodium (COLACE) 250 MG capsule Take 1 capsule (250 mg total) by mouth daily. 11/20/21   Fayette Pho, MD  DULoxetine (CYMBALTA) 30 MG capsule Take 1 capsule (30 mg total) by mouth 2 (two) times daily. 11/02/21   Karsten Ro, MD  famotidine (PEPCID) 20 MG tablet Take 1 tablet (20 mg total) by mouth at bedtime. 11/20/21 11/15/22  Fayette Pho, MD  Galcanezumab-gnlm [facility]) 120 MG/ML SOAJ Inject 120 mg into the skin every 28 (twenty-eight) days. 11/23/21   Drema Dallas, DO  guaiFENesin (MUCINEX) 600 MG 12 hr tablet Take 1 tablet (600 mg total) by mouth 2 (two) times daily. 11/21/21   Sabino Dick, DO  ketoconazole (NIZORAL) 2 % cream Apply 1 Application topically daily. 12/25/21   McDonald, Rachelle Hora, DPM  lidocaine (XYLOCAINE) 5 % ointment APPLY TOPICALLY 1 APPLICATION AS NEEDED 11/20/21   Fayette Pho, MD  linaclotide [facility]) 290 MCG CAPS capsule Take 1 capsule (290 mcg total) by mouth daily before breakfast. 11/21/21   Imogene Burn, MD  lisinopril (ZESTRIL) 10 MG tablet Take 1 tablet (10 mg total) by mouth at bedtime. 11/20/21   Fayette Pho, MD  methylPREDNISolone (MEDROL DOSEPAK) 4 MG TBPK tablet 6 day dose pack - take as directed 12/25/21   Edwin Cap, DPM  omeprazole (PRILOSEC) 40 MG capsule Take 1 capsule (40 mg total) by mouth daily. Take one capsule 30 minutes before breakfast  daily. 11/21/21   Imogene Burn, MD  QUEtiapine (SEROQUEL) 50 MG tablet Take 1 tablet (50 mg total) by mouth at bedtime. 11/02/21   Karsten Ro, MD  rosuvastatin (CRESTOR) 10 MG tablet Take 1 tablet (10 mg total) by mouth daily. Please come to clinic lab for cholesterol check. 11/20/21   Fayette Pho, MD  senna (SENOKOT) 8.6 MG TABS tablet Take 1 tablet (8.6 mg total) by mouth daily. 11/20/21   Fayette Pho, MD  Ubrogepant (UBRELVY) 100 MG TABS Medication Samples have been provided to the patient.  Drug name: Bernita Raisin       Strength: 100mg         Qty: 4  LOT  Exp.Date: 08/2022  Dosing instructions:   The patient has been instructed regarding the correct time, dose, and frequency of taking this medication, including desired effects and most common side effects.   [name] 11:35 AM 05/22/2021 05/22/21   05/24/21, DO  verapamil (CALAN-SR) 120 MG CR tablet TAKE 1 TABLET BY MOUTH AT BEDTIME. 12/27/21   12/29/21, MD  zonisamide (ZONEGRAN) 100 MG capsule Take 1 capsule (100 mg total) by mouth daily. 06/15/21   06/17/21, DO      Allergies    Nsaids and Triptans    Review of Systems   Review of Systems  Physical Exam Updated Vital Signs There were no vitals taken for this visit. Physical Exam Vitals and nursing note reviewed.  Constitutional:      Appearance: Normal appearance.  HENT:     Head: Normocephalic and atraumatic.  Eyes:     General: No scleral icterus.    Conjunctiva/sclera: Conjunctivae normal.  Pulmonary:     Effort: Pulmonary effort is normal. No respiratory distress.  Skin:    Findings: No rash.  Neurological:     Mental Status: She is alert.     Cranial Nerves: No cranial nerve deficit.     Motor: No weakness.     Coordination: Coordination normal.     Comments: Annual nerves II through XII grossly intact.  No problem with finger-nose bilaterally.  No slurred speech or facial droop.  No nystagmus.  Normal neurologic exam  Psychiatric:        Mood and Affect: Mood normal.     ED Results / Procedures / Treatments   Labs (all labs ordered are listed, but only abnormal results are displayed) Labs Reviewed  I-STAT CHEM 8, ED - Abnormal; Notable for the following components:      Result Value   Calcium, Ion 1.01 (*)    All other components within normal limits  PROTIME-INR  APTT  CBC  DIFFERENTIAL  COMPREHENSIVE METABOLIC PANEL  ETHANOL  CBG MONITORING, ED  I-STAT BETA HCG BLOOD, ED ([facility], [facility], [facility] ONLY)    EKG None  Radiology CT HEAD CODE STROKE WO CONTRAST  Result Date:  01/04/2022 CLINICAL DATA:  Initial evaluation for neuro deficit, left-sided weakness, headache. EXAM: CT ANGIOGRAPHY HEAD AND NECK TECHNIQUE: Multidetector CT imaging of the head and neck was performed using the standard protocol during bolus administration of intravenous contrast. Multiplanar CT image reconstructions and MIPs were obtained to evaluate the vascular anatomy. Carotid stenosis measurements (when applicable) are obtained utilizing NASCET criteria, using the distal internal carotid diameter as the denominator. RADIATION DOSE REDUCTION: This exam was performed according to the departmental dose-optimization program which includes automated exposure control, adjustment of the mA and/or kV according to patient size and/or use of iterative  reconstruction technique. CONTRAST:  66mL OMNIPAQUE IOHEXOL 350 MG/ML SOLN COMPARISON:  Prior study from 12/20/2021. FINDINGS: CT HEAD FINDINGS Brain: Cerebral volume within normal limits for patient age. No evidence for acute intracranial hemorrhage. No findings to suggest acute large vessel territory infarct. No mass lesion, midline shift, or mass effect. Ventricles are normal in size without evidence for hydrocephalus. No extra-axial fluid collection identified. Partially empty sella noted. Vascular: No hyperdense vessel identified. Skull: Scalp soft tissues demonstrate no acute abnormality. Calvarium intact. Sinuses/Orbits: Globes and orbital soft tissues within normal limits. Visualized paranasal sinuses are clear. No mastoid effusion. CTA NECK FINDINGS Aortic arch: Visualized aortic arch normal caliber with normal branch pattern. No stenosis about the origin of the great vessels. Right carotid system: Right common and internal carotid arteries patent without dissection, stenosis, or occlusion. Beading of the cervical right ICA, consistent with FMD. Left carotid system: Left common and internal carotid arteries patent without stenosis, dissection or occlusion. Beading  of the cervical left ICA consistent with FMD. Vertebral arteries: Both vertebral arteries arise from the subclavian arteries. No proximal subclavian artery stenosis. Both vertebral arteries widely patent without stenosis, dissection or occlusion. Skeleton: No discrete or worrisome osseous lesions. Mild multilevel cervical spondylosis noted. Other neck: No other acute soft tissue abnormality within the neck. Upper chest: Visualized upper chest demonstrates no acute finding. Review of the MIP images confirms the above findings CTA HEAD FINDINGS Anterior circulation: Both internal carotid arteries patent to the termini without stenosis. A1 segments, anterior communicating artery complex common anterior cerebral arteries are widely patent. No M1 stenosis or occlusion. No proximal MCA branch occlusion or stenosis. Distal MCA branches perfused and symmetric. Saccular right MCA bifurcation aneurysm again seen measuring 5 x 4 mm, similar to prior. Posterior circulation: Both V4 segments patent to the vertebrobasilar junction without stenosis. Both PICA origins patent and normal. Basilar widely patent to its distal aspect without stenosis. Superior cerebellar arteries patent bilaterally. Both PCAs primarily supplied via the basilar and are well perfused to there distal aspects. Venous sinuses: Patent allowing for timing the contrast bolus. Anatomic variants: None significant. Review of the MIP images confirms the above findings IMPRESSION: CT HEAD IMPRESSION: 1. No acute intracranial abnormality. 2. Aspects = 10. CTA HEAD AND NECK IMPRESSION: 1. Stable CTA of the head and neck. No large vessel occlusion or other emergent finding. No hemodynamically significant or correctable stenosis. 2. 5 x 4 mm right MCA bifurcation aneurysm, stable. 3. Beading of the cervical ICAs bilaterally, consistent with FMD. These results were communicated to Dr. Rory Percy at 9:35 pm on 01/04/2022 by text page via the [facility] messaging system.  Electronically Signed   By: Jeannine Boga M.D.   On: 01/04/2022 21:44   CT ANGIO HEAD NECK W WO CM (CODE STROKE)  Result Date: 01/04/2022 CLINICAL DATA:  Initial evaluation for neuro deficit, left-sided weakness, headache. EXAM: CT ANGIOGRAPHY HEAD AND NECK TECHNIQUE: Multidetector CT imaging of the head and neck was performed using the standard protocol during bolus administration of intravenous contrast. Multiplanar CT image reconstructions and MIPs were obtained to evaluate the vascular anatomy. Carotid stenosis measurements (when applicable) are obtained utilizing NASCET criteria, using the distal internal carotid diameter as the denominator. RADIATION DOSE REDUCTION: This exam was performed according to the departmental dose-optimization program which includes automated exposure control, adjustment of the mA and/or kV according to patient size and/or use of iterative reconstruction technique. CONTRAST:  74mL OMNIPAQUE IOHEXOL 350 MG/ML SOLN COMPARISON:  Prior study from 12/20/2021. FINDINGS: CT  HEAD FINDINGS Brain: Cerebral volume within normal limits for patient age. No evidence for acute intracranial hemorrhage. No findings to suggest acute large vessel territory infarct. No mass lesion, midline shift, or mass effect. Ventricles are normal in size without evidence for hydrocephalus. No extra-axial fluid collection identified. Partially empty sella noted. Vascular: No hyperdense vessel identified. Skull: Scalp soft tissues demonstrate no acute abnormality. Calvarium intact. Sinuses/Orbits: Globes and orbital soft tissues within normal limits. Visualized paranasal sinuses are clear. No mastoid effusion. CTA NECK FINDINGS Aortic arch: Visualized aortic arch normal caliber with normal branch pattern. No stenosis about the origin of the great vessels. Right carotid system: Right common and internal carotid arteries patent without dissection, stenosis, or occlusion. Beading of the cervical right ICA,  consistent with FMD. Left carotid system: Left common and internal carotid arteries patent without stenosis, dissection or occlusion. 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 Vertebral arteries: Both vertebral arteries arise from the subclavian arteries. No proximal subclavian artery stenosis. Both vertebral arteries widely patent without stenosis, dissection or occlusion. Skeleton: No discrete or worrisome osseous lesions. Mild multilevel cervical spondylosis noted. Other neck: No other acute soft tissue abnormality within the neck. Upper chest: Visualized upper chest demonstrates no acute finding. Review of the MIP images confirms the above findings CTA HEAD FINDINGS Anterior circulation: Both internal carotid arteries patent to the termini without stenosis. A1 segments, anterior communicating artery complex common anterior cerebral arteries are widely patent. No M1 stenosis or occlusion. No proximal MCA branch occlusion or stenosis. Distal MCA branches perfused and symmetric. Saccular right MCA bifurcation aneurysm again seen measuring 5 x 4 mm, similar to prior. Posterior circulation: Both V4 segments patent to the vertebrobasilar junction without stenosis. Both PICA origins patent and normal. Basilar widely patent to its distal aspect without stenosis. Superior cerebellar arteries patent bilaterally. Both PCAs primarily supplied via the basilar and are well perfused to there distal aspects. Venous sinuses: Patent allowing for timing the contrast bolus. Anatomic variants: None significant. Review of the MIP images confirms the above findings IMPRESSION: CT HEAD IMPRESSION: 1. No acute intracranial abnormality. 2. Aspects = 10. CTA HEAD AND NECK IMPRESSION: 1. Stable CTA of the head and neck. No large vessel occlusion or other emergent finding. No hemodynamically significant or correctable stenosis. 2. 5 x 4 mm right MCA bifurcation aneurysm, stable. 3. Beading of the cervical ICAs bilaterally,  consistent with FMD. These results were communicated to Dr. Wilford CornerArora at 9:35 pm on 01/04/2022 by text page via the [facility] messaging system. Electronically Signed   By: Rise MuBenjamin  McClintock M.D.   On: 01/04/2022 21:44    Procedures .Critical Care  Performed by: Saddie Bendersedwine, Jaivon Vanbeek A, PA-C Authorized by: Saddie Bendersedwine, Aliviana Burdell A, PA-C   Critical care provider statement:    Critical care time (minutes):  30   Critical care time was exclusive of:  Separately billable procedures and treating other patients   Critical care was necessary to treat or prevent imminent or life-threatening deterioration of the following conditions:  CNS failure or compromise   Critical care was time spent personally by me on the following activities:  Development of treatment plan with patient or surrogate, discussions with consultants, discussions with primary provider, evaluation of patient's response to treatment, obtaining history from patient or surrogate, ordering and review of laboratory studies, ordering and review of radiographic studies, pulse oximetry, re-evaluation of patient's condition and review of old charts   I assumed direction of critical care for this patient from another provider in  my specialty: no      Medications Ordered in ED Medications  sodium chloride flush (NS) 0.9 % injection 3 mL (has no administration in time range)  iohexol (OMNIPAQUE) 350 MG/ML injection 75 mL (75 mLs Intravenous Contrast Given 01/04/22 2122)    ED Course/ Medical Decision Making/ A&P                           Medical Decision Making Amount and/or Complexity of Data Reviewed Labs: ordered. Radiology: ordered.   This is a 41 year old female who presents to the ED for concern of strokelike symptoms.  Differential includes but is not limited to CVA, aneurysmal hemorrhage, seizure and migraine.   This is not an exhaustive differential.    Past Medical History / Co-morbidities / Social History: Recurrent migraines and known  cerebral aneurysm   Additional history: Per chart review, patient had a CT angiogram on 10/19.  This showed that her aneurysm was measuring up to 5 mm.   Physical Exam: Pertinent physical exam findings include No acute findings  Lab Tests: I ordered, and personally interpreted labs.  The pertinent results include: Unremarkable   Imaging Studies: I ordered and independently visualized and interpreted CT head and CT angiogram and I agree with the radiologist that there are no acute findings.  Stable aneurysm    Medications: Patient given migraine cocktail to include Compazine, morphine, Zofran and fluids.   Consultations Obtained: I spoke with Dr. Wilford Corner with neurology.  CT of the head was negative.  CT angiogram pending.  Plan is to treat the patient with a migraine cocktail and if this improves she may with her neurologist in 4 to 6 weeks.  With no improvement, MRI will be ordered.  MDM/Disposition: This is a 41 year old female who presented with strokelike symptoms.  Spoke with Dr. Wilford Corner who agrees that patient is more likely a stroke mimic.  Most likely complex migraine.  She was treated with migraine cocktail and resolved symptoms.  Admission was considered for further work-up however consultant believes it is reasonable to do this outpatient with her neurologist in 4-6 weeks.  She is agreeable.  Discharged in stable condition.   I discussed this case with my attending physician Dr. Clarice Pole who cosigned this note including patient's presenting symptoms, physical exam, and planned diagnostics and interventions. Attending physician stated agreement with plan or made changes to plan which were implemented.      Final Clinical Impression(s) / ED Diagnoses Final diagnoses:  Other migraine without status migrainosus, not intractable    Rx / DC Orders ED Discharge Orders     None      Results and diagnoses were explained to the patient. Return precautions discussed in full.  Patient had no additional questions and expressed complete understanding.   This chart was dictated using voice recognition software.  Despite best efforts to proofread,  errors can occur which can change the documentation meaning.    Woodroe Chen 01/04/22 2325    Arby Barrette, MD 01/24/22 1705

## 2022-01-04 NOTE — Telephone Encounter (Signed)
No need

## 2022-01-04 NOTE — Discharge Instructions (Signed)
As we discussed, your head CT looked normal.  Your CT angiogram showed your aneurysm that does not appear to be any different than prior.  Please call your neurologist to get an appointment within the next 4 to 6 weeks.  Return with any recurring or worsening symptoms.  It was a pleasure to meet you and we hope you continue to feel better!

## 2022-01-04 NOTE — ED Notes (Addendum)
Dr. Arora at bedside 

## 2022-01-04 NOTE — Telephone Encounter (Signed)
Pt called in and left a message with the access nurse. She is calling about results

## 2022-01-04 NOTE — ED Triage Notes (Signed)
Pt BIB EMS as Code Stroke. LKW 2030. Pt reports sudden onset of headache, left sided weakness, mild left sided facial droop. Pt has hx of HTN, aneurysm. VS with EMS  168/100 HR 96 CBG 90 18LH 18LAC

## 2022-01-04 NOTE — Consult Note (Incomplete)
Neurology Consultation  Reason for Consult: Headache, left-sided weakness, code stroke Referring Physician: Dr. Vallery Ridge  CC: Left-sided weakness, headache, code stroke  History is obtained from: Patient, chart  HPI: Christine Gordon is a 41 y.o. female past medical history of migraines, right MCA aneurysm with recent mild increase in size based on the CTA done outpatient, sleep apnea, hypertension, obesity, presenting to the emergency room for sudden onset of holocephalic headache and left-sided weakness brought in as a code stroke by EMS. Usual state of health at about 8:30 PM, when she had a sudden onset of cholestatic headache followed by left-sided weakness.  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  EMS was called.  They noted some left-sided weakness and due to the sudden onset of symptoms activated code stroke and brought her to the ER. She is a patient of Dr. Ernestina Patches and is on preventative headache treatment outpatient.  She had a recent CTA for follow-up of the known right MCA aneurysm but showed a mild increase in size for which outpatient endovascular consultation is planned. Denies any visual symptoms.  Denies any chest pain shortness of breath.  Reports some nausea.Denies any vomiting.   LKW: 8:30 PM IV thrombolysis given?: no, history and examination more consistent with complex migraine than stroke-stroke mimic Premorbid modified Rankin scale (mRS): 0  ROS: Full ROS was performed and is negative except as noted in the HPI.    Past Medical History:  Diagnosis Date   Abdominal bloating 12/22/2020   Bleeding nose 12/22/2020   COVID-19 12/22/2020   COVID infection June 2022.    H/O cesarean section    x3 perpatient   Healthcare maintenance 12/22/2020   Hypertension    Knee pain 04/04/2021   Microscopic hematuria 05/24/2020   Formatting of this note might be different from the original. Urinalysis 03/22 and 04/22. No urine infection  Referred to Urology 05/22   Vitamin D deficiency 07/21/2020     Family History  Problem Relation Age of Onset   Healthy Mother    Colon cancer Sister    High Cholesterol Maternal Aunt    High blood pressure Maternal Aunt    Diabetes Paternal Aunt    Diabetes Paternal Uncle    Diabetes Paternal Grandmother    Esophageal cancer Neg Hx    Pancreatic cancer Neg Hx    Stomach cancer Neg Hx      Social History:   reports that she quit smoking about 4 years ago. Her smoking use included cigarettes. She has never used smokeless tobacco. She reports that she does not drink alcohol and does not use drugs.  Medications No current facility-administered medications for this encounter.  Current Outpatient Medications:    acetaminophen (TYLENOL 8 HOUR) 650 MG CR tablet, Take 1 tablet (650 mg total) by mouth every 6 (six) hours as needed for pain or fever., Disp: 30 tablet, Rfl: 1   albuterol (VENTOLIN HFA) 108 (90 Base) MCG/ACT inhaler, Inhale 2 puffs into the lungs every 6 (six) hours as needed., Disp: 1 each, Rfl: 3   Alpha-D-Galactosidase (GAS-X PREVENTION) CAPS, Take 80 capsules by mouth as directed., Disp: 180 capsule, Rfl: 1   aspirin EC 81 MG tablet, Take 81 mg by mouth daily. Swallow whole., Disp: , Rfl:    benzonatate (TESSALON PERLES) 100 MG capsule, Take 1 capsule (100 mg total) by mouth 3 (three) times daily as needed for cough., Disp: 20 capsule, Rfl: 0   Capsaicin-Menthol-Methyl Sal 0.001-10-20 %  LOTN, Apply to knee daily for pain relief., Disp: 113.4 g, Rfl: 3   Cholecalciferol 125 MCG (5000 UT) TABS, Take by mouth., Disp: , Rfl:    [employer] SALINE NASAL SPRAY 0.65 % nasal spray, SPRAY 1 SPRAY INTO EACH NOSTRIL AS NEEDED FOR CONGESTION, Disp: 60 mL, Rfl: 11   cyclobenzaprine (FLEXERIL) 5 MG tablet, Take 1 tablet (5 mg total) by mouth 3 (three) times daily as needed., Disp: 30 tablet, Rfl: 0   docusate sodium (COLACE) 250 MG capsule, Take 1 capsule (250 mg total) by mouth daily., Disp:  30 capsule, Rfl: 3   DULoxetine (CYMBALTA) 30 MG capsule, Take 1 capsule (30 mg total) by mouth 2 (two) times daily., Disp: 60 capsule, Rfl: 2   famotidine (PEPCID) 20 MG tablet, Take 1 tablet (20 mg total) by mouth at bedtime., Disp: 90 tablet, Rfl: 3   Galcanezumab-gnlm (EMGALITY) 120 MG/ML SOAJ, Inject 120 mg into the skin every 28 (twenty-eight) days., Disp: 1.12 mL, Rfl: 5   guaiFENesin (MUCINEX) 600 MG 12 hr tablet, Take 1 tablet (600 mg total) by mouth 2 (two) times daily., Disp: 14 tablet, Rfl: 0   ketoconazole (NIZORAL) 2 % cream, Apply 1 Application topically daily., Disp: 60 g, Rfl: 2   lidocaine (XYLOCAINE) 5 % ointment, APPLY TOPICALLY 1 APPLICATION AS NEEDED, Disp: 35.44 g, Rfl: 3   linaclotide (LINZESS) 290 MCG CAPS capsule, Take 1 capsule (290 mcg total) by mouth daily before breakfast., Disp: 30 capsule, Rfl: 2   lisinopril (ZESTRIL) 10 MG tablet, Take 1 tablet (10 mg total) by mouth at bedtime., Disp: 90 tablet, Rfl: 3   methylPREDNISolone (MEDROL DOSEPAK) 4 MG TBPK tablet, 6 day dose pack - take as directed, Disp: 21 tablet, Rfl: 0   omeprazole (PRILOSEC) 40 MG capsule, Take 1 capsule (40 mg total) by mouth daily. Take one capsule 30 minutes before breakfast daily., Disp: 90 capsule, Rfl: 3   QUEtiapine (SEROQUEL) 50 MG tablet, Take 1 tablet (50 mg total) by mouth at bedtime., Disp: 30 tablet, Rfl: 2   rosuvastatin (CRESTOR) 10 MG tablet, Take 1 tablet (10 mg total) by mouth daily. Please come to clinic lab for cholesterol check., Disp: 30 tablet, Rfl: 2   senna (SENOKOT) 8.6 MG TABS tablet, Take 1 tablet (8.6 mg total) by mouth daily., Disp: 120 tablet, Rfl: 2   Ubrogepant (UBRELVY) 100 MG TABS, Medication Samples have been provided to the patient.  Drug name: [name]       Strength: 100mg         Qty: 4  LOT: [id number]  Exp.Date: 08/2022  Dosing instructions:   The patient has been instructed regarding the correct time, dose, and frequency of taking this medication, including desired  effects and most common side effects.   [name] 11:35 AM 05/22/2021, Disp: 4 tablet, Rfl: 0   verapamil (CALAN-SR) 120 MG CR tablet, TAKE 1 TABLET BY MOUTH AT BEDTIME., Disp: 90 tablet, Rfl: 3   zonisamide (ZONEGRAN) 100 MG capsule, Take 1 capsule (100 mg total) by mouth daily., Disp: 30 capsule, Rfl: 5   Exam: Current vital signs: BP (!) 141/81 (BP Location: Right Arm)   Pulse 87   Temp 98.4 F (36.9 C) (Oral)   Resp 16   Ht 5\' 4"  (1.626 m)   Wt 83.4 kg   SpO2 100%   BMI 31.56 kg/m  Vital signs in last 24 hours: Temp:  [98.4 F (36.9 C)] 98.4 F (36.9 C) (11/03 2136) Pulse Rate:  [87]  87 (11/03 2136) Resp:  [16] 16 (11/03 2136) BP: (141)/(81) 141/81 (11/03 2136) SpO2:  [100 %] 100 % (11/03 2136) Weight:  [83.4 kg] 83.4 kg (11/03 2130) GENERAL: Awake, alert in NAD HEENT: - Normocephalic and atraumatic, dry mm, no LN++, no Thyromegally LUNGS - Clear to auscultation bilaterally with no wheezes CV - S1S2 RRR, no m/r/g, equal pulses bilaterally. ABDOMEN - Soft, nontender, nondistended with normoactive BS Ext: warm, well perfused, intact peripheral pulses, __ edema  NEURO:  Mental Status: AA&Ox3  Language: speech is nondysarthric.  Naming, repetition, fluency, and comprehension intact. Cranial Nerves: PERRLOMI, visual fields full, no facial asymmetry, facial sensation intact, hearing intact, tongue/uvula/soft palate midline, normal sternocleidomastoid and trapezius muscle strength. No evidence of tongue atrophy or fibrillations Motor: Effort dependent left-sided weakness with mild drift of the upper and lower extremity but full strength on the right. Tone: is normal and bulk is normal Sensation-diminished to all modalities on the left with a sharp cut off in the midline including that to vibration on the forehead. Coordination: FTN intact bilaterally, no ataxia in BLE. Gait- deferred  NIHSS 1a Level of Conscious.: 0 1b LOC Questions: 0 1c LOC Commands: 0 2 Best Gaze:  0 3 Visual: 0 4 Facial Palsy: 0 5a Motor Arm - left: 1 5b Motor Arm - Right: 0 6a Motor Leg - Left: 1 6b Motor Leg - Right: 0 7 Limb Ataxia: 0 8 Sensory: 2 9 Best Language: 0 10 Dysarthria: 0 11 Extinct. and Inatten.: 0 TOTAL: 4   Labs I have reviewed labs in epic and the results pertinent to this consultation are:  CBC    Component Value Date/Time   WBC 9.6 01/04/2022 2121   RBC 3.92 01/04/2022 2121   HGB 12.9 01/04/2022 2122   HCT 38.0 01/04/2022 2122   PLT 260 01/04/2022 2121   MCV 93.6 01/04/2022 2121   MCH 32.1 01/04/2022 2121   MCHC 34.3 01/04/2022 2121   RDW 12.5 01/04/2022 2121   LYMPHSABS 3.8 01/04/2022 2121   MONOABS 0.7 01/04/2022 2121   EOSABS 0.3 01/04/2022 2121   BASOSABS 0.0 01/04/2022 2121    CMP     Component Value Date/Time   NA 139 01/04/2022 2122   K 4.0 01/04/2022 2122   CL 106 01/04/2022 2122   CO2 23 08/22/2021 1713   GLUCOSE 91 01/04/2022 2122   BUN 13 01/04/2022 2122   CREATININE 0.70 01/04/2022 2122   CALCIUM 9.4 08/22/2021 1713   PROT 6.4 (L) 08/22/2021 1713   ALBUMIN 3.9 08/22/2021 1713   AST 16 08/22/2021 1713   ALT 15 08/22/2021 1713   ALKPHOS 70 08/22/2021 1713   BILITOT 0.6 08/22/2021 1713   GFRNONAA >60 08/22/2021 1713   Imaging I have reviewed the images obtained: CT-head: No acute changes. CT angio head and neck no emergent LVO.  Stable right MCA aneurysm.  Assessment:  41 year old with above past medical history that includes migraines with aura, complex migraines, right MCA aneurysm, hypertension, obesity, sleep apnea presenting for sudden onset of cholestatic headache followed by left-sided weakness and numbness. Examination has some effort dependent left-sided weakness. In the setting of her typical headache with typical left-sided symptoms, clinical suspicion more for complex migraine than stroke. CT head unremarkable. CT angio head and neck with stable appearance of the right MCA aneurysm and no emergent LVO  also is reassuring of this not being a stroke. Would recommend treatment of the complex migraine and outpatient follow-up if symptoms are relieved.  Impression: Headache  with left-sided weakness-likely complex migraine along with some giveaway weakness Stable right MCA aneurysm-no evidence of subarachnoid bleed on CT  Recommendations: Treat headache with Zofran, Compazine and IV fluids. If symptoms get better with the IV medications, no further inpatient work-up. If she remains symptomatic after migraine cocktail, may need an MRI to rule out structural lesion. Low suspicion for this being a subarachnoid hemorrhage given the stable unruptured appearance of the aneurysm and noncontrasted head CT which is unremarkable. I offered to send referral to neuro endovascular for her right MCA aneurysm elective treatment options discussion-she said that she prefers to be referred to [facility] and her outpatient neurologist has already sent that referral.  In case need be, please reach out to the [facility] health neuro endovascular team-Dr. Estanislado Pandy or Dr. Smitty Pluck  Plan discussed with Dr. Vallery Ridge and Mervyn Gay, PA in the ER.  -- Amie Portland, MD Neurologist [facility] Pager: [phone]

## 2022-01-07 ENCOUNTER — Encounter: Payer: Self-pay | Admitting: Family Medicine

## 2022-01-08 ENCOUNTER — Encounter (HOSPITAL_COMMUNITY): Payer: Self-pay

## 2022-01-08 ENCOUNTER — Other Ambulatory Visit: Payer: Self-pay

## 2022-01-08 ENCOUNTER — Emergency Department (HOSPITAL_COMMUNITY): Payer: Medicaid Other

## 2022-01-08 ENCOUNTER — Emergency Department (HOSPITAL_COMMUNITY)
Admission: EM | Admit: 2022-01-08 | Discharge: 2022-01-08 | Disposition: A | Discharge status: Home / Self Care | Payer: Medicaid Other | Attending: Emergency Medicine | Admitting: Emergency Medicine

## 2022-01-08 DIAGNOSIS — Z5321 Procedure and treatment not carried out due to patient leaving prior to being seen by health care provider: Secondary | ICD-10-CM | POA: Insufficient documentation

## 2022-01-08 DIAGNOSIS — R220 Localized swelling, mass and lump, head: Secondary | ICD-10-CM | POA: Insufficient documentation

## 2022-01-08 DIAGNOSIS — R519 Headache, unspecified: Secondary | ICD-10-CM | POA: Insufficient documentation

## 2022-01-08 LAB — CBC WITH DIFFERENTIAL/PLATELET
Abs Immature Granulocytes: 0.03 10*3/uL (ref 0.00–0.07)
Basophils Absolute: 0 10*3/uL (ref 0.0–0.1)
Basophils Relative: 0 %
Eosinophils Absolute: 0.1 10*3/uL (ref 0.0–0.5)
Eosinophils Relative: 2 %
HCT: 37.7 % (ref 36.0–46.0)
Hemoglobin: 13 g/dL (ref 12.0–15.0)
Immature Granulocytes: 0 %
Lymphocytes Relative: 27 %
Lymphs Abs: 2.6 10*3/uL (ref 0.7–4.0)
MCH: 32.1 pg (ref 26.0–34.0)
MCHC: 34.5 g/dL (ref 30.0–36.0)
MCV: 93.1 fL (ref 80.0–100.0)
Monocytes Absolute: 0.4 10*3/uL (ref 0.1–1.0)
Monocytes Relative: 4 %
Neutro Abs: 6.3 10*3/uL (ref 1.7–7.7)
Neutrophils Relative %: 67 %
Platelets: 242 10*3/uL (ref 150–400)
RBC: 4.05 MIL/uL (ref 3.87–5.11)
RDW: 12.4 % (ref 11.5–15.5)
WBC: 9.4 10*3/uL (ref 4.0–10.5)
nRBC: 0 % (ref 0.0–0.2)

## 2022-01-08 LAB — BASIC METABOLIC PANEL
Anion gap: 11 (ref 5–15)
BUN: 10 mg/dL (ref 6–20)
CO2: 22 mmol/L (ref 22–32)
Calcium: 9.3 mg/dL (ref 8.9–10.3)
Chloride: 105 mmol/L (ref 98–111)
Creatinine, Ser: 0.69 mg/dL (ref 0.44–1.00)
GFR, Estimated: >60 mL/min (ref >60)
Glucose, Bld: 130 mg/dL — ABNORMAL HIGH (ref 70–99)
Potassium: 3.5 mmol/L (ref 3.5–5.1)
Sodium: 138 mmol/L (ref 135–145)

## 2022-01-08 LAB — I-STAT BETA HCG BLOOD, ED (MC, WL, AP ONLY): I-stat hCG, quantitative: <5 m[IU]/mL (ref <5)

## 2022-01-08 NOTE — ED Provider Triage Note (Signed)
Emergency Medicine Provider Triage Evaluation Note  Christine Gordon , a 41 y.o. female  was evaluated in triage.  Pt complains of headache and blurry vision for the last few days.  Patient was seen in the ED on Friday, had CT a head and neck which is notable for stable aneurysm.  She was having left-sided hand weakness in triage at that time was activated as a code stroke.  Today she is still having a headache and intermittent blurry vision.  States it improved on Friday but has been worsening over the last few days.  She still feels intermittent weakness of the left hand since Friday..  Review of Systems  Per HPI  Physical Exam  BP (!) 146/98   Pulse (!) 115   Temp 98.7 F (37.1 C)   Resp 19   SpO2 100%  Gen:   Awake, no distress. Moaning in agony  Resp:  Normal effort  MSK:   Moves extremities without difficulty  Other:  Cranial nerves II through XII are grossly intact.  Upper extremity symmetric is roughly symmetric bilaterally.  No pronator drift, normal finger-nose.  Medical Decision Making  Medically screening exam initiated at 1:16 PM.  Appropriate orders placed.  Mena Simonis was informed that the remainder of the evaluation will be completed by another provider, this initial triage assessment does not replace that evaluation, and the importance of remaining in the ED until their evaluation is complete.  Symptoms going on for 3 days, not a code stroke.   Sherrill Raring, PA-C 01/08/22 1319

## 2022-01-08 NOTE — ED Notes (Signed)
Pt stated that she could review results on Mychart

## 2022-01-08 NOTE — ED Triage Notes (Signed)
Patient here with ongoing headache and head pressure after being seen in ED this past Friday for same. Reports that the pain resolved but returned today. Alert and oriented. No vomiting, mild nausea.

## 2022-01-10 ENCOUNTER — Ambulatory Visit (INDEPENDENT_AMBULATORY_CARE_PROVIDER_SITE_OTHER): Payer: Medicaid Other | Admitting: Family Medicine

## 2022-01-10 VITALS — BP 138/102 | HR 97 | Ht 60.0 in | Wt 181.6 lb

## 2022-01-10 DIAGNOSIS — I10 Essential (primary) hypertension: Secondary | ICD-10-CM

## 2022-01-10 DIAGNOSIS — I671 Cerebral aneurysm, nonruptured: Secondary | ICD-10-CM | POA: Diagnosis not present

## 2022-01-10 DIAGNOSIS — R531 Weakness: Secondary | ICD-10-CM | POA: Diagnosis not present

## 2022-01-10 DIAGNOSIS — R29898 Other symptoms and signs involving the musculoskeletal system: Secondary | ICD-10-CM

## 2022-01-10 DIAGNOSIS — E236 Other disorders of pituitary gland: Secondary | ICD-10-CM | POA: Diagnosis not present

## 2022-01-10 MED ORDER — LISINOPRIL 20 MG PO TABS: 20.0000 mg | ORAL_TABLET | Freq: Every day | ORAL | 2 refills | Status: DC

## 2022-01-10 NOTE — Patient Instructions (Addendum)
It was wonderful to see you today. Thank you for allowing me to be a part of your care. Below is a short summary of what we discussed at your visit today:  Left arm numbness and tingling I am sending you for an MRI of your brain and neck. We will send this to your insurance for approval. Once it is approved, we will call you with an appointment date and time.  Blood pressure Blood pressure is elevated today.  Want to keep very tight control this because of your brain aneurysm that is growing.   Increase your lisinopril from 10 mg to 20 mg daily. While you are using up what you have at home, take 2 of the 10 mg tablets to make 20 mg every day until you run out.  Then start the new 20 mg pill.  Come back in 2 weeks after your [facility] neuro appointment for blood pressure recheck and labs.  Disability, time off work [employer] FMLA: This is the family medical leave act.  It is a Stage manager that guarantees will not be fired off work for medical issues.  You request these forms from your employer, usually the HR department.  Drop-off portion you need your doctor to fill out.  We will have these forms completed within 7 business days.  Disability: This is where you file for disability with the state to get some sort of income what you are out of work.  Usually, there are special disability offices to do evaluations.  I can certainly write letters explaining your medical diagnoses and how they can impair you.  Look up how to start your disability application and submit the paperwork needs to be completed by a doctor.  Please bring all of your medications to every appointment!  If you have any questions or concerns, please do not hesitate to contact us via phone or MyChart message.   Fayette Pho, MD

## 2022-01-10 NOTE — Progress Notes (Signed)
SUBJECTIVE:   CHIEF COMPLAINT / HPI:   Headache, aneurysm, left arm weakness follow-up Christine Gordon is a 41 year old woman who presents today for follow-up from ED visits for left arm weakness, headache, and brain aneurysm of the right MCA bifurcation.    She first presented to the emergency room 11/3 via EMS for acute headache at the superior head and bilateral eyes, EMS noted left-sided weakness and activated code stroke.  CT head was unremarkable, CTA head and neck noted stable aneurysm.  Neuro consulted, believes this is a stroke mimic due to complex migraine.  Treated with migraine cocktail that resolved symptoms per note.  Instructed to follow-up with neuro in 4 to 6 weeks.  She can present to the emergency room 11/7 for dizziness and headache.  She reports that her headache has not really gone away after the first ED visit.  ED providers obtained another CT head which is unremarkable aside from previously noted partial empty sella.  Today she reports continued headache.  Left-sided weakness is chronic, she does not believe this is any worse than normal.  She reports she was able to get her appointment with [facility] neurology moved up to 11/20.  She will see Dr. Marolyn Haller.  CT head code stroke without contrast 01/04/2022 No acute intracranial abnormality.  Partial empty sella noted.  CTA head and neck 01/04/22 Radiologist measured aneurysm of right MCA bifurcation as 5 x 4 mm, called it stable.  Also present was beading of the cervical ICAs bilaterally, consistent with known diagnosis of fibromuscular dysplasia.  CT head without contrast 01/08/2022 No acute intracranial abnormality.  Partial empty sella noted is a normal anatomic variant that can sometimes be associated with idiopathic intracranial hypertension.  CTA head 12/20/2021: "right MCA bifurcation aneurysm demonstrates slight interval increase in size, now measuring up to 5 mm. Maximal previous measurement was 4.2 mm."  CTA head and  neck W WO 01/24/2021: Right MCA bifurcation aneurysm 3 x 2 mm  PERTINENT  PMH / PSH: Cervical spondylosis, chronic complaint of left-sided weakness, migraine without aura, fibromuscular dysplasia, cerebral aneurysm without rupture, hypertension, MDD  OBJECTIVE:   BP (!) 138/102   Pulse 97   Ht 5' (1.524 m)   Wt 181 lb 9.6 oz (82.4 kg)   SpO2 99%   BMI 35.47 kg/m    PHQ-9:     01/10/2022   10:09 AM 10/09/2021    9:04 AM 07/18/2021    1:39 PM  Depression screen PHQ 2/9  Decreased Interest 1 1   Down, Depressed, Hopeless 1 1   PHQ - 2 Score 2 2   Altered sleeping 1 1   Tired, decreased energy 1 1   Change in appetite 1 1   Feeling bad or failure about yourself  1 0   Trouble concentrating 1 1   Moving slowly or fidgety/restless 1 0   Suicidal thoughts 0 0   PHQ-9 Score 8 6   Difficult doing work/chores Somewhat difficult       Information is confidential and restricted. Go to Review Flowsheets to unlock data.   Physical Exam General: Awake, alert, oriented Cardiovascular: Regular rate and rhythm, S1 and S2 present, no murmurs auscultated Respiratory: Lung fields clear to auscultation bilaterally Neuro: LUE strength 4/5, sensation greatly diminished on left arm, RUE strength 5/5, BLE strength 5/5 and equal, BUE reflexes intact  ASSESSMENT/PLAN:   Hypertension Uncontrolled today.  Unclear if pain or true hypertension.  Either way, given her growing right MCA aneurysm,  will pursue aggressive blood pressure control.  Increase lisinopril from 10 mg to 20 mg.  Continue verapamil 120 mg.  Recheck in 1 to 2 weeks.  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 Growing on CTA scans, now 5 x 4 mm. We will proceed blood pressure control.  Currently follows with Dr. Everlena Cooper, neurology. Has [facility] neurology appointment 01/21/2022.  Chronic complaint of left-sided weakness Given decreased sensation and worsening weakness of left arm, we will pursue MRI brain and C-spine. Will follow  results.     Fayette Pho, MD [facility]

## 2022-01-11 ENCOUNTER — Encounter: Payer: Self-pay | Admitting: Family Medicine

## 2022-01-11 DIAGNOSIS — E236 Other disorders of pituitary gland: Secondary | ICD-10-CM | POA: Insufficient documentation

## 2022-01-11 NOTE — Assessment & Plan Note (Addendum)
Growing on CTA scans, now 5 x 4 mm. We will proceed blood pressure control.  Currently follows with Dr. Everlena Cooper, neurology. Has [facility] neurology appointment 01/21/2022.

## 2022-01-11 NOTE — Assessment & Plan Note (Signed)
Given decreased sensation and worsening weakness of left arm, we will pursue MRI brain and C-spine. Will follow results.

## 2022-01-11 NOTE — Assessment & Plan Note (Signed)
Uncontrolled today.  Unclear if pain or true hypertension.  Either way, given her growing right MCA aneurysm, will pursue aggressive blood pressure control.  Increase lisinopril from 10 mg to 20 mg.  Continue verapamil 120 mg.  Recheck in 1 to 2 weeks.

## 2022-01-23 ENCOUNTER — Emergency Department (HOSPITAL_COMMUNITY): Payer: Medicaid Other

## 2022-01-23 ENCOUNTER — Other Ambulatory Visit: Payer: Self-pay

## 2022-01-23 ENCOUNTER — Emergency Department (HOSPITAL_COMMUNITY)
Admission: EM | Admit: 2022-01-23 | Discharge: 2022-01-23 | Disposition: A | Discharge status: Home / Self Care | Payer: Medicaid Other | Attending: Emergency Medicine | Admitting: Emergency Medicine

## 2022-01-23 DIAGNOSIS — Z79899 Other long term (current) drug therapy: Secondary | ICD-10-CM | POA: Insufficient documentation

## 2022-01-23 DIAGNOSIS — G4489 Other headache syndrome: Secondary | ICD-10-CM | POA: Diagnosis not present

## 2022-01-23 DIAGNOSIS — R531 Weakness: Secondary | ICD-10-CM | POA: Diagnosis not present

## 2022-01-23 DIAGNOSIS — G43409 Hemiplegic migraine, not intractable, without status migrainosus: Secondary | ICD-10-CM

## 2022-01-23 DIAGNOSIS — I639 Cerebral infarction, unspecified: Secondary | ICD-10-CM | POA: Diagnosis present

## 2022-01-23 DIAGNOSIS — Z7982 Long term (current) use of aspirin: Secondary | ICD-10-CM | POA: Diagnosis not present

## 2022-01-23 DIAGNOSIS — R209 Unspecified disturbances of skin sensation: Secondary | ICD-10-CM | POA: Insufficient documentation

## 2022-01-23 LAB — COMPREHENSIVE METABOLIC PANEL
ALT: 20 U/L (ref 0–44)
AST: 19 U/L (ref 15–41)
Albumin: 3.6 g/dL (ref 3.5–5.0)
Alkaline Phosphatase: 68 U/L (ref 38–126)
Anion gap: 12 (ref 5–15)
BUN: 11 mg/dL (ref 6–20)
CO2: 21 mmol/L — ABNORMAL LOW (ref 22–32)
Calcium: 9.3 mg/dL (ref 8.9–10.3)
Chloride: 104 mmol/L (ref 98–111)
Creatinine, Ser: 0.78 mg/dL (ref 0.44–1.00)
GFR, Estimated: >60 mL/min (ref >60)
Glucose, Bld: 112 mg/dL — ABNORMAL HIGH (ref 70–99)
Potassium: 3.7 mmol/L (ref 3.5–5.1)
Sodium: 137 mmol/L (ref 135–145)
Total Bilirubin: 0.4 mg/dL (ref 0.3–1.2)
Total Protein: 6.3 g/dL — ABNORMAL LOW (ref 6.5–8.1)

## 2022-01-23 LAB — DIFFERENTIAL
Abs Immature Granulocytes: 0.02 10*3/uL (ref 0.00–0.07)
Basophils Absolute: 0 10*3/uL (ref 0.0–0.1)
Basophils Relative: 0 %
Eosinophils Absolute: 0.3 10*3/uL (ref 0.0–0.5)
Eosinophils Relative: 4 %
Immature Granulocytes: 0 %
Lymphocytes Relative: 45 %
Lymphs Abs: 3.2 10*3/uL (ref 0.7–4.0)
Monocytes Absolute: 0.4 10*3/uL (ref 0.1–1.0)
Monocytes Relative: 6 %
Neutro Abs: 3.3 10*3/uL (ref 1.7–7.7)
Neutrophils Relative %: 45 %

## 2022-01-23 LAB — I-STAT BETA HCG BLOOD, ED (MC, WL, AP ONLY): I-stat hCG, quantitative: <5 m[IU]/mL (ref <5)

## 2022-01-23 LAB — I-STAT CHEM 8, ED
BUN: 12 mg/dL (ref 6–20)
Calcium, Ion: 1.23 mmol/L (ref 1.15–1.40)
Chloride: 104 mmol/L (ref 98–111)
Creatinine, Ser: 0.8 mg/dL (ref 0.44–1.00)
Glucose, Bld: 109 mg/dL — ABNORMAL HIGH (ref 70–99)
HCT: 40 % (ref 36.0–46.0)
Hemoglobin: 13.6 g/dL (ref 12.0–15.0)
Potassium: 3.7 mmol/L (ref 3.5–5.1)
Sodium: 139 mmol/L (ref 135–145)
TCO2: 24 mmol/L (ref 22–32)

## 2022-01-23 LAB — CBC
HCT: 38.1 % (ref 36.0–46.0)
Hemoglobin: 13.1 g/dL (ref 12.0–15.0)
MCH: 32 pg (ref 26.0–34.0)
MCHC: 34.4 g/dL (ref 30.0–36.0)
MCV: 92.9 fL (ref 80.0–100.0)
Platelets: 297 10*3/uL (ref 150–400)
RBC: 4.1 MIL/uL (ref 3.87–5.11)
RDW: 12.4 % (ref 11.5–15.5)
WBC: 7.2 10*3/uL (ref 4.0–10.5)
nRBC: 0 % (ref 0.0–0.2)

## 2022-01-23 LAB — CBG MONITORING, ED: Glucose-Capillary: 115 mg/dL — ABNORMAL HIGH (ref 70–99)

## 2022-01-23 LAB — ETHANOL: Alcohol, Ethyl (B): <10 mg/dL (ref <10)

## 2022-01-23 LAB — APTT: aPTT: 27 seconds (ref 24–36)

## 2022-01-23 LAB — PROTIME-INR
INR: 0.9 (ref 0.8–1.2)
Prothrombin Time: 12.4 seconds (ref 11.4–15.2)

## 2022-01-23 MED ORDER — HYDROCODONE-ACETAMINOPHEN 5-325 MG PO TABS
1.0000 | ORAL_TABLET | Freq: Once | ORAL | Status: AC
  Administered 2022-01-23: 1 via ORAL
  Filled 2022-01-23: qty 1

## 2022-01-23 MED ORDER — PROCHLORPERAZINE EDISYLATE 10 MG/2ML IJ SOLN
10.0000 mg | Freq: Once | INTRAMUSCULAR | Status: AC
  Administered 2022-01-23: 10 mg via INTRAVENOUS
  Filled 2022-01-23: qty 2

## 2022-01-23 MED ORDER — SODIUM CHLORIDE 0.9% FLUSH
3.0000 mL | Freq: Once | INTRAVENOUS | Status: AC
  Administered 2022-01-23: 3 mL via INTRAVENOUS

## 2022-01-23 NOTE — ED Notes (Signed)
Pt ambulated to restroom independently without any complaints.  

## 2022-01-23 NOTE — Consult Note (Signed)
NEUROLOGY CONSULTATION NOTE   Date of service: January 23, 2022 Patient Name: Christine Gordon MRN:  [id number] DOB:  [birth year] Reason for consult: "Headache, left sided weakness" Requesting Provider: Ripley Fraise, MD _ _ _   _ __   _ __ _ _  __ __   _ __   __ _  History of Present Illness  Christine Gordon is a 41 y.o. female with PMH significant for migraine, HTN, hemiplegic migraines who presents with 8/10 headache behind her R eye and into the R temple along with left sided numbness and weakness.  Endorses photophobia with some nausea. No vomiting. Feels identical to her prior headaches.  Reports that she has been weak on the left for 1-2 years, it comes and goes on the left side. She reports that tonight around 2100, headache got significantly worse and weakness and numbness on the left is off and on. She had a similar episode back on Nov 3rd and was brought in as a code stroke. CTH negative, CTA with R MCA aneurysm that was stable in size compared to CTA from sept 2023. She is to see neuro IR outpatient for the noted aneurysm.  EMS brought her in as a code stroke. Stat CTH which was obtained which was negative for any intracranial abnormalities.  LKW: unable to clarify despite multiple attempts to get a last known well. Reports 1-2 years of off and on left sided weakness and numbness and that it was her headache that got worse around 9pm last night. Spoke to her about documented neuro exam from visit to Dr. Georgie Chard clinic from [place] 2023 with 5/5 motor strength BL but patient adamant that she was weak back then. mRS: 0 tNKASE: not offered, unclear LKW and felt to be more concerning for hemiplegic migraine rather than acute stroke. Thrombectomy: not offered, low suspicion for LVO. NIHSS components Score: Comment  1a Level of Conscious 0[]  1[x]  2[]  3[]      1b LOC Questions 0[x]  1[]  2[]       1c LOC Commands 0[x]  1[]  2[]       2 Best Gaze 0[x]  1[]  2[]       3 Visual 0[x]  1[]  2[]  3[]      4  Facial Palsy 0[x]  1[]  2[]  3[]      5a Motor Arm - left 0[]  1[x]  2[]  3[]  4[]  UN[]    5b Motor Arm - Right 0[x]  1[]  2[]  3[]  4[]  UN[]    6a Motor Leg - Left 0[]  1[x]  2[]  3[]  4[]  UN[]    6b Motor Leg - Right 0[x]  1[]  2[]  3[]  4[]  UN[]    7 Limb Ataxia 0[x]  1[]  2[]  3[]  UN[]     8 Sensory 0[]  1[x]  2[]  UN[]      9 Best Language 0[x]  1[]  2[]  3[]      10 Dysarthria 0[x]  1[]  2[]  UN[]      11 Extinct. and Inattention 0[x]  1[]  2[]       TOTAL: 4     ROS   Constitutional Denies weight loss, fever and chills.   HEENT Denies changes in vision and hearing.  Respiratory Denies SOB and cough.   CV Denies palpitations and CP   GI Denies abdominal pain, + nausea, no vomiting and diarrhea.   GU Denies dysuria and urinary frequency.   MSK Denies myalgia and joint pain.   Skin Denies rash and pruritus.   Neurological + headache but no syncope.   Psychiatric Denies recent changes in mood. Denies anxiety and depression.    Past History   Past Medical History:  Diagnosis Date   Abdominal bloating 12/22/2020   Bleeding nose 12/22/2020   Contraception management 10/09/2017   Depo-provera   COVID-19 12/22/2020   COVID infection June 2022.    H/O cesarean section    x3 perpatient   Healthcare maintenance 12/22/2020   Hypertension    Knee pain 04/04/2021   Microscopic hematuria 05/24/2020   Formatting of this note might be different from the original. Urinalysis 03/22 and 04/22. No urine infection Referred to Urology 05/22   Pain in both feet 10/31/2021   Routine screening for STI (sexually transmitted infection) 10/09/2021   Viral illness 11/21/2021   Vitamin D deficiency 07/21/2020   Past Surgical History:  Procedure Laterality Date   CESAREAN SECTION N/A    x3 per patient   Family History  Problem Relation Age of Onset   Healthy Mother    Colon cancer Sister    High Cholesterol Maternal Aunt    High blood pressure Maternal Aunt    Diabetes Paternal Aunt    Diabetes Paternal Uncle    Diabetes  Paternal Grandmother    Esophageal cancer Neg Hx    Pancreatic cancer Neg Hx    Stomach cancer Neg Hx    Social History   Socioeconomic History   Marital status: Single    Spouse name: Not on file   Number of children: 4   Years of education: Not on file   Highest education level: Not on file  Occupational History   Not on file  Tobacco Use   Smoking status: Former    Types: Cigarettes    Quit date: 2019    Years since quitting: 4.8   Smokeless tobacco: Never  Vaping Use   Vaping Use: Never used  Substance and Sexual Activity   Alcohol use: Never   Drug use: Never   Sexual activity: Not on file  Other Topics Concern   Not on file  Social History Narrative   Ms. Logiudice has 4 children: daughter (b ~69) and 3 sons (b.~2021),(b.2003), and (b. ~2005).   2 oldest sons live in [place] and her youngest son lives with her   Right handed   No caffeine   Social Determinants of Health   Financial Resource Strain: Not on file  Food Insecurity: Not on file  Transportation Needs: Not on file  Physical Activity: Not on file  Stress: Stress Concern Present (08/15/2021)   [employer] of Occupational Health - Occupational Stress Questionnaire    Feeling of Stress : To some extent  Social Connections: Not on file   Allergies  Allergen Reactions   Nsaids Other (See Comments)    NSAIDs contraindicated due to cerebral aneurysm and fibromuscular dysplasia.   Triptans Other (See Comments)    Triptans are contraindicated due to cerebral aneurysm and fibromuscular dysplasia.     Medications  (Not in a hospital admission)    Vitals   There were no vitals filed for this visit.   There is no height or weight on file to calculate BMI.  Physical Exam   General: Laying comfortably in bed; holding her head and eyes closed and appears somewhat uncomfortable. HENT: Normal oropharynx and mucosa. Normal external appearance of ears and nose.  Neck: Supple, no pain or  tenderness  CV: No JVD. No peripheral edema.  Pulmonary: Symmetric Chest rise. Normal respiratory effort.  Abdomen: Soft to touch, non-tender.  Ext: No cyanosis, edema, or deformity  Skin: No rash. Normal palpation of skin.   Musculoskeletal: Normal  digits and nails by inspection. No clubbing.   Neurologic Examination  Mental status/Cognition: mildly drowsy, oriented to self, place, month and year, good attention.  Speech/language: Fluent, comprehension intact, object naming intact, repetition intact.  Cranial nerves:   CN II Pupils equal and reactive to light, no VF deficits    CN III,IV,VI EOM intact, no gaze preference or deviation, no nystagmus    CN V normal sensation in V1, V2, and V3 segments bilaterally    CN VII no asymmetry, no nasolabial fold flattening    CN VIII normal hearing to speech    CN IX & X normal palatal elevation, no uvular deviation    CN XI 5/5 head turn and 5/5 shoulder shrug bilaterally    CN XII midline tongue protrusion    Motor:  Muscle bulk: normal, tone normal, mild LUE drift. Mvmt Root Nerve  Muscle Right Left Comments  SA C5/6 Ax Deltoid 5 4+   EF C5/6 Mc Biceps 5 4+   EE C6/7/8 Rad Triceps 5 4+   WF C6/7 Med FCR     WE C7/8 PIN ECU     F Ab C8/T1 U ADM/FDI 5 4+   HF L1/2/3 Fem Illopsoas 5 4+   KE L2/3/4 Fem Quad 5 5   DF L4/5 D Peron Tib Ant 5 4+   PF S1/2 Tibial Grc/Sol 5 5    Sensation:  Light touch Severely decreased on the left.   Pin prick    Temperature    Vibration   Proprioception    Coordination/Complex Motor:  - Finger to Nose intact BL - Heel to shin intact BL - Rapid alternating movement are normal - Gait: deferred for patient safety.  Labs   CBC: No results for input(s): "WBC", "NEUTROABS", "HGB", "HCT", "MCV", "PLT" in the last 168 hours.  Basic Metabolic Panel:  Lab Results  Component Value Date   NA 138 01/08/2022   K 3.5 01/08/2022   CO2 22 01/08/2022   GLUCOSE 130 (H) 01/08/2022   BUN 10 01/08/2022    CREATININE 0.69 01/08/2022   CALCIUM 9.3 01/08/2022   GFRNONAA >60 01/08/2022   Lipid Panel: No results found for: "[place]" HgbA1c: No results found for: "HGBA1C" Urine Drug Screen: No results found for: "LABOPIA", "COCAINSCRNUR", "LABBENZ", "AMPHETMU", "THCU", "LABBARB"  Alcohol Level     Component Value Date/Time   ETH <10 01/04/2022 2121    CT Head without contrast(Personally reviewed): CTH was negative for a large hypodensity concerning for a large territory infarct or hyperdensity concerning for an ICH   Impression   Christine Gordon is a 41 y.o. female with PMH significant for migraine, HTN, hemiplegic migraines who presents with 8/10 headache behind her R eye and into the R temple along with left sided numbness and weakness. Exam with L sided numbness with mild weakness with giveaway component noted. Overall, presentation most concerning for hemiplegic migraine. Low suspicion for SAH, althou at risk given the noted aneurysm. CTH obtained within 6 hours of headache, should be sensitive enough for [facility].  Tnkase/thrombectomy not offered 2/2 low suspicion for stroke.  Impression: Hemiplegic migraine.  Recommendations  - headache cocktail. Discharge if headache and left sided symptoms improved with cocktail. If headache and left sided symptoms persist after headache cocktail, recommend MRI Brain without contrast. ______________________________________________________________________   Thank you for the opportunity to take part in the care of this patient. If you have any further questions, please contact the neurology consultation attending.  Signed,  Donnetta Simpers [facility] Pager Number [phone] _ _ _   _ __   _ __ _ _  __ __   _ __   __ _

## 2022-01-23 NOTE — Discharge Instructions (Signed)

## 2022-01-23 NOTE — ED Provider Notes (Signed)
[facility] EMERGENCY DEPARTMENT Provider Note   CSN: [id number] Arrival date & time: 01/23/22  0104  An emergency department physician performed an initial assessment on this suspected stroke patient at 0103.  History  Chief Complaint  Patient presents with   Code Stroke   Level 5 caveat due to acuity of condition Christine Gordon is a 41 y.o. female.  The history is provided by the patient.  Patient arrived as a possible code stroke.  Last known well around 2100 on November 21.  Patient had reported headache and left-sided weakness and numbness. Patient had no other acute complaints on arrival    After CT head was performed and negative, patient had further discussion with neurology.  Apparently patient has similar episodes previously and was treated for migraine and improved.  She does have a known aneurysm that is being evaluated for treatment as an outpatient.  Please see neurology notes for further details Home Medications Prior to Admission medications   Medication Sig Start Date End Date Taking? Authorizing Provider  acetaminophen (TYLENOL 8 HOUR) 650 MG CR tablet Take 1 tablet (650 mg total) by mouth every 6 (six) hours as needed for pain or fever. 11/21/21   Sabino Dick, DO  albuterol (VENTOLIN HFA) 108 (90 Base) MCG/ACT inhaler Inhale 2 puffs into the lungs every 6 (six) hours as needed. 11/20/21   Fayette Pho, MD  Alpha-D-Galactosidase (GAS-X PREVENTION) CAPS Take 80 capsules by mouth as directed. 11/21/21   Imogene Burn, MD  aspirin EC 81 MG tablet Take 81 mg by mouth daily. Swallow whole.    [provider]  benzonatate (TESSALON PERLES) 100 MG capsule Take 1 capsule (100 mg total) by mouth 3 (three) times daily as needed for cough. 11/21/21   Sabino Dick, DO  Capsaicin-Menthol-Methyl Sal 0.001-10-20 % LOTN Apply to knee daily for pain relief. 11/20/21   Fayette Pho, MD  Cholecalciferol 125 MCG (5000 UT) TABS Take by mouth.  09/14/20   [provider]  [facility] SALINE NASAL SPRAY 0.65 % nasal spray SPRAY 1 SPRAY INTO EACH NOSTRIL AS NEEDED FOR CONGESTION 12/13/21   Fayette Pho, MD  cyclobenzaprine (FLEXERIL) 5 MG tablet Take 1 tablet (5 mg total) by mouth 3 (three) times daily as needed. 11/20/21   Fayette Pho, MD  docusate sodium (COLACE) 250 MG capsule Take 1 capsule (250 mg total) by mouth daily. 11/20/21   Fayette Pho, MD  DULoxetine (CYMBALTA) 30 MG capsule Take 1 capsule (30 mg total) by mouth 2 (two) times daily. 11/02/21   Karsten Ro, MD  famotidine (PEPCID) 20 MG tablet Take 1 tablet (20 mg total) by mouth at bedtime. 11/20/21 11/15/22  Fayette Pho, MD  Galcanezumab-gnlm [facility]) 120 MG/ML SOAJ Inject 120 mg into the skin every 28 (twenty-eight) days. 11/23/21   Drema Dallas, DO  guaiFENesin (MUCINEX) 600 MG 12 hr tablet Take 1 tablet (600 mg total) by mouth 2 (two) times daily. 11/21/21   Sabino Dick, DO  ketoconazole (NIZORAL) 2 % cream Apply 1 Application topically daily. 12/25/21   McDonald, Rachelle Hora, DPM  lidocaine (XYLOCAINE) 5 % ointment APPLY TOPICALLY 1 APPLICATION AS NEEDED 11/20/21   Fayette Pho, MD  linaclotide [facility]) 290 MCG CAPS capsule Take 1 capsule (290 mcg total) by mouth daily before breakfast. 11/21/21   Imogene Burn, MD  lisinopril (ZESTRIL) 20 MG tablet Take 1 tablet (20 mg total) by mouth at bedtime. 01/10/22   Fayette Pho, MD  methylPREDNISolone (MEDROL DOSEPAK) 4 MG  TBPK tablet 6 day dose pack - take as directed 12/25/21   Criselda Peaches, DPM  omeprazole (PRILOSEC) 40 MG capsule Take 1 capsule (40 mg total) by mouth daily. Take one capsule 30 minutes before breakfast daily. 11/21/21   Sharyn Creamer, MD  QUEtiapine (SEROQUEL) 50 MG tablet Take 1 tablet (50 mg total) by mouth at bedtime. 11/02/21   Armando Reichert, MD  rosuvastatin (CRESTOR) 10 MG tablet Take 1 tablet (10 mg total) by mouth daily. Please come to clinic lab for cholesterol check. 11/20/21    Ezequiel Essex, MD  senna (SENOKOT) 8.6 MG TABS tablet Take 1 tablet (8.6 mg total) by mouth daily. 11/20/21   Ezequiel Essex, MD  Ubrogepant (UBRELVY) 100 MG TABS Medication Samples have been provided to the patient.  Drug name: Christine Gordon       Strength: 100mg         Qty: 4  LOTXK:[id number]  Exp.Date: 08/2022  Dosing instructions:   The patient has been instructed regarding the correct time, dose, and frequency of taking this medication, including desired effects and most common side effects.   [name] 11:35 AM 05/22/2021 05/22/21   Pieter Partridge, DO  verapamil (CALAN-SR) 120 MG CR tablet TAKE 1 TABLET BY MOUTH AT BEDTIME. 12/27/21   Ezequiel Essex, MD  zonisamide (ZONEGRAN) 100 MG capsule Take 1 capsule (100 mg total) by mouth daily. 06/15/21   Pieter Partridge, DO      Allergies    Nsaids and Triptans    Review of Systems   Review of Systems  Unable to perform ROS: Acuity of condition    Physical Exam Updated Vital Signs BP (!) 154/88   Pulse 89   Temp 98 F (36.7 C) (Oral)   Resp 14   Ht 1.524 m (5')   Wt 84.5 kg   SpO2 100%   BMI 36.38 kg/m  Physical Exam CONSTITUTIONAL: Chronically ill-appearing, no acute distress HEAD: Normocephalic/atraumatic EYES: EOMI/PERRL ENMT: Mucous membranes moist NECK: supple no meningeal signs CV: S1/S2 noted, no murmurs/rubs/gallops noted LUNGS: Lungs are clear to auscultation bilaterally, no apparent distress ABDOMEN: soft, nontender NEURO: Pt is drowsy but arousable No facial droop.  There is motor deficits in the left arm and left leg.  There is a sensory deficit.  NIHSS is 4 EXTREMITIES: pulses normal/equal, full ROM SKIN: warm, color normal   ED Results / Procedures / Treatments   Labs (all labs ordered are listed, but only abnormal results are displayed) Labs Reviewed  COMPREHENSIVE METABOLIC PANEL - Abnormal; Notable for the following components:      Result Value   CO2 21 (*)    Glucose, Bld 112 (*)    Total Protein  6.3 (*)    All other components within normal limits  I-STAT CHEM 8, ED - Abnormal; Notable for the following components:   Glucose, Bld 109 (*)    All other components within normal limits  CBG MONITORING, ED - Abnormal; Notable for the following components:   Glucose-Capillary 115 (*)    All other components within normal limits  PROTIME-INR  APTT  CBC  DIFFERENTIAL  ETHANOL  I-STAT BETA HCG BLOOD, ED ([facility], [facility], [facility] ONLY)    EKG EKG Interpretation  Date/Time:  Wednesday January 23 2022 01:35:41 EST Ventricular Rate:  78 PR Interval:  172 QRS Duration: 81 QT Interval:  387 QTC Calculation: 441 R Axis:   38 Text Interpretation: Sinus rhythm No significant change since last tracing Confirmed by  Ripley Fraise [id number]) on 01/23/2022 1:48:56 AM  Radiology CT HEAD CODE STROKE WO CONTRAST  Result Date: 01/23/2022 CLINICAL DATA:  Code stroke. EXAM: CT HEAD WITHOUT CONTRAST TECHNIQUE: Contiguous axial images were obtained from the base of the skull through the vertex without intravenous contrast. RADIATION DOSE REDUCTION: This exam was performed according to the departmental dose-optimization program which includes automated exposure control, adjustment of the mA and/or kV according to patient size and/or use of iterative reconstruction technique. COMPARISON:  01/08/2022 FINDINGS: Brain: No evidence of acute infarction, hemorrhage, cerebral edema, mass, mass effect, or midline shift. No hydrocephalus or extra-axial collection. Vascular: No hyperdense vessel. Skull: Negative for fracture or focal lesion. Sinuses/Orbits: No acute finding. Other: The mastoid air cells are well aerated. ASPECTS [facility] Stroke Program Early CT Score) - Ganglionic level infarction (caudate, lentiform nuclei, internal capsule, insula, M1-M3 cortex): 7 - Supraganglionic infarction (M4-M6 cortex): 3 Total score (0-10 with 10 being normal): 10 IMPRESSION: No acute intracranial process.  ASPECTS is 10. Code stroke  imaging results were communicated on 01/23/2022 at 1:21 am to provider Dr. Lorrin Goodell via secure text paging. Electronically Signed   By: Merilyn Baba M.D.   On: 01/23/2022 01:22    Procedures Procedures    Medications Ordered in ED Medications  HYDROcodone-acetaminophen (NORCO/VICODIN) 5-325 MG per tablet 1 tablet (has no administration in time range)  sodium chloride flush (NS) 0.9 % injection 3 mL (3 mLs Intravenous Given 01/23/22 0141)  prochlorperazine (COMPAZINE) injection 10 mg (10 mg Intravenous Given 01/23/22 0203)    ED Course/ Medical Decision Making/ A&P Clinical Course as of 01/23/22 0422  Wed Jan 23, 2022  0334 Patient reports feeling improved and she is ambulatory [DW]  0420 Patient feels back to baseline.  She has no focal deficits at this time.  She is well-appearing.  This appears to be a chronic process.  Low suspicion for acute neurologic emergency.  She recently had her BP meds increased but is still having elevated blood pressures.  She will reach out to her PCP about any increases in medicines.  At this time no signs of acute hypertensive emergency [DW]    Clinical Course User Index [DW] Ripley Fraise, MD                           Medical Decision Making Amount and/or Complexity of Data Reviewed Labs: ordered. Radiology: ordered.  Risk Prescription drug management.   This patient presents to the ED for concern of weakness, this involves an extensive number of treatment options, and is a complaint that carries with it a high risk of complications and morbidity.  The differential diagnosis includes but is not limited to CVA, intracranial hemorrhage, acute coronary syndrome, renal failure, urinary tract infection, electrolyte disturbance, pneumonia    Comorbidities that complicate the patient evaluation: Patient's presentation is complicated by their history of hypertension  Social Determinants of Health: Patient's  previous tobacco use   increases the  complexity of managing their presentation  Additional history obtained: Records reviewed Care Everywhere/External Records  Lab Tests: I Ordered, and personally interpreted labs.  The pertinent results include: Labs overall unremarkable  Imaging Studies ordered: I ordered imaging studies including CT scan head   I independently visualized and interpreted imaging which showed no acute findings I agree with the radiologist interpretation  Cardiac Monitoring: The patient was maintained on a cardiac monitor.  I personally viewed and interpreted the cardiac monitor which showed an underlying rhythm  of:  sinus rhythm  Medicines ordered and prescription drug management: I ordered medication including Compazine for headache Reevaluation of the patient after these medicines showed that the patient    improved  Test Considered: Considered MRI and CT angio, but patient is improving and lower suspicion for acute neurologic emergency  Consultations Obtained: I requested consultation with the consultant Dr. Lorrin Goodell , and discussed  findings as well as pertinent plan - they recommend: He does not recommend further CT angio studies as she has had extensive workup recently.  She has already been seen by neurointerventional radiology.  If this represented acute SAH, would likely see intracranial hemorrhage on CT head as it has been less than 6 hours since onset.  No signs of acute intracranial hemorrhage on tonight's CT scan  if patient improves with migraine medications, she can likely be discharged  Reevaluation: After the interventions noted above, I reevaluated the patient and found that they have :improved  Complexity of problems addressed: Patient's presentation is most consistent with  acute presentation with potential threat to life or bodily function  Disposition: After consideration of the diagnostic results and the patient's response to treatment,  I feel that the patent would benefit  from discharge   .           Final Clinical Impression(s) / ED Diagnoses Final diagnoses:  Other headache syndrome    Rx / DC Orders ED Discharge Orders     None         Ripley Fraise, MD 01/23/22 [phone]

## 2022-01-23 NOTE — ED Notes (Signed)
Pt ambulated to restroom unassisted 

## 2022-01-23 NOTE — ED Notes (Signed)
Patient verbalizes understanding of d/c instructions. Opportunities for questions and answers were provided. Pt d/c from ED and ambulated to lobby where a friend will be picking pt up.  

## 2022-01-23 NOTE — ED Triage Notes (Signed)
Pt BIB [facility] from home c/o weakness and numbness to L side. Pt also c/o migraine. Pt has known aneurysm. LKN 2100. Pt lethargic but oriented.  VS 174/124 BP 81 HR 100% RA CBG 142

## 2022-01-28 ENCOUNTER — Telehealth: Payer: Self-pay | Admitting: *Deleted

## 2022-01-28 ENCOUNTER — Ambulatory Visit (INDEPENDENT_AMBULATORY_CARE_PROVIDER_SITE_OTHER): Payer: Medicaid Other | Admitting: Family Medicine

## 2022-01-28 VITALS — BP 138/72 | HR 99 | Ht 60.0 in | Wt 183.0 lb

## 2022-01-28 DIAGNOSIS — I10 Essential (primary) hypertension: Secondary | ICD-10-CM

## 2022-01-28 DIAGNOSIS — I671 Cerebral aneurysm, nonruptured: Secondary | ICD-10-CM

## 2022-01-28 MED ORDER — VERAPAMIL HCL ER 120 MG PO TBCR: 120.0000 mg | EXTENDED_RELEASE_TABLET | Freq: Every day | ORAL | 3 refills | Status: DC

## 2022-01-28 MED ORDER — OLMESARTAN MEDOXOMIL-HCTZ 20-12.5 MG PO TABS: 1.0000 | ORAL_TABLET | Freq: Every day | ORAL | 0 refills | Status: DC

## 2022-01-28 NOTE — Assessment & Plan Note (Signed)
Has follow-up with radiology tomorrow to evaluate further.

## 2022-01-28 NOTE — Patient Instructions (Addendum)
It was wonderful to see you today.  Please bring ALL of your medications with you to every visit.   Today we talked about:  STOP the Lisinopril. START the Benicar (Olmesartan-HCTZ). Take one pill daily. CONTINUE the Verapamil.  Return in 1 week for a blood pressure check and for some blood work to check your electrolytes. Please bring in your blood pressure cuff so we can compare the reading with ours.   Thank you for coming to your visit as scheduled. We have had a large "no-show" problem lately, and this significantly limits our ability to see and care for patients. As a friendly reminder- if you cannot make your appointment please call to cancel. We do have a no show policy for those who do not cancel within 24 hours. Our policy is that if you miss or fail to cancel an appointment within 24 hours, 3 times in a 1-month period, you may be dismissed from our clinic.   Thank you for choosing [facility].   Please call [phone] with any questions about today's appointment.  Please be sure to schedule follow up at the front  desk before you leave today.   Sabino Dick, DO PGY-3 Family Medicine

## 2022-01-28 NOTE — Telephone Encounter (Signed)
FMLA form was handed to clinical staff when patient came in for an appt today. Clinical info completed on FMLA form.  Placed form in Dr. Jerolyn Center box for completion.    When form is completed, please route note to "RN Team" and place in wall pocket in front office.   Christine Gordon, CMA

## 2022-01-28 NOTE — Progress Notes (Signed)
    SUBJECTIVE:   CHIEF COMPLAINT / HPI:   Christine Gordon is a 41 y.o. female who presents to the [facility] clinic today to discuss the following concerns:   ED, Hypertension F/U She was recently seen in the ED on 11/22 for a possible code stroke.  She was reporting symptoms of headache and left-sided weakness and numbness at the time.  She had a negative CT head and her episode was thought to be due to complicated migraine. Neurology was consulted but did not recommend additional imaging studies due to recent extensive workup. While in the ED she did have elevated blood pressures.  She presents today for follow-up on her blood pressure management. Current medications include Lisinopril 20 mg, verapamil 120 mg. Reports good compliance. She has a home blood pressure cuff but she isn't sure if it is reading correctly since she will get multiple different numbers during rechecks- she has noted as high as 200s/180s. Denies chest pain. Notes possible lower extremity edema L>R. Headaches are "okay" today. She was using a cold rag which seemed to calm her throbbing headaches.   She has MRI of c-spine and brain scheduled on 12/2.  She states that she is having a procedure tomorrow to take a closer look at her brain aneurysm.  CTA head on 11/3 showed a stable 5 x 4 mm right MCA aneurysm.  PERTINENT  PMH / PSH: Fibromuscular dysplasia, HLD, MDD  OBJECTIVE:   BP 138/72   Pulse 99   Ht 5' (1.524 m)   Wt 183 lb (83 kg)   SpO2 100%   BMI 35.74 kg/m   BP Readings from Last 3 Encounters:  01/28/22 138/72  01/23/22 (!) 154/100  01/10/22 (!) 138/102   General: NAD, pleasant, able to participate in exam Cardiac: RRR, no murmurs. Respiratory: CTAB, normal effort, No wheezes, rales or rhonchi Extremities: no edema or cyanosis. Psych: Normal affect and mood  ASSESSMENT/PLAN:   Hypertension Elevated above goal today. Has been compliant with Lisinopril and Verapamil. Last 3 readings are elevated.  -STOP  lisinopril -Start Benicar (Olmesartan-HCTZ 20-12.5mg ) daily -Continue Verapamil (refilled today).  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    Sabino Dick, DO [facility]

## 2022-01-28 NOTE — Assessment & Plan Note (Signed)
Elevated above goal today. Has been compliant with Lisinopril and Verapamil. Last 3 readings are elevated.  -STOP lisinopril -Start Benicar (Olmesartan-HCTZ 20-12.5mg ) daily -Continue Verapamil (refilled today).  -F/u in 1 week for BP check and BMP

## 2022-01-31 ENCOUNTER — Ambulatory Visit
Admission: RE | Admit: 2022-01-31 | Discharge: 2022-01-31 | Disposition: A | Discharge status: Home / Self Care | Payer: Medicaid Other | Source: Ambulatory Visit | Attending: Family Medicine | Admitting: Family Medicine

## 2022-01-31 DIAGNOSIS — Z1231 Encounter for screening mammogram for malignant neoplasm of breast: Secondary | ICD-10-CM

## 2022-02-02 ENCOUNTER — Ambulatory Visit
Admission: RE | Admit: 2022-02-02 | Discharge: 2022-02-02 | Disposition: A | Discharge status: Home / Self Care | Payer: Medicaid Other | Source: Ambulatory Visit | Attending: Family Medicine | Admitting: Family Medicine

## 2022-02-02 DIAGNOSIS — R29898 Other symptoms and signs involving the musculoskeletal system: Secondary | ICD-10-CM

## 2022-02-04 ENCOUNTER — Encounter: Payer: Self-pay | Admitting: Family Medicine

## 2022-02-06 ENCOUNTER — Ambulatory Visit (INDEPENDENT_AMBULATORY_CARE_PROVIDER_SITE_OTHER): Payer: Medicaid Other | Admitting: Family Medicine

## 2022-02-06 ENCOUNTER — Encounter: Payer: Self-pay | Admitting: Family Medicine

## 2022-02-06 VITALS — BP 126/78 | HR 100 | Ht 60.0 in | Wt 181.0 lb

## 2022-02-06 DIAGNOSIS — I671 Cerebral aneurysm, nonruptured: Secondary | ICD-10-CM | POA: Diagnosis not present

## 2022-02-06 DIAGNOSIS — I159 Secondary hypertension, unspecified: Secondary | ICD-10-CM

## 2022-02-06 DIAGNOSIS — I639 Cerebral infarction, unspecified: Secondary | ICD-10-CM | POA: Diagnosis not present

## 2022-02-06 NOTE — Assessment & Plan Note (Signed)
Scheduled for right craniotomy with coil placement 03/29/2022 at [facility].

## 2022-02-06 NOTE — Progress Notes (Cosign Needed Addendum)
    SUBJECTIVE:   CHIEF COMPLAINT / HPI:   [name] is a pleasant 41 year old woman who presents today for blood pressure follow-up.  HTN Previously uncontrolled on verapamil 120 mg and lisinopril 20 mg.  Stopped lisinopril and started on Benicar (olmesartan-HCTZ 20-12.5 mg) on 11/27 per Dr. Melba Coon.  Today, will blood pressure very well-controlled.  Patient reports she does have episodes of dizziness when standing, although this was present prior to the Benicar.  Still feeling some pressure on the top of her head.  BP Readings from Last 3 Encounters:  02/06/22 126/78  01/28/22 138/72  01/23/22 (!) 154/100    Other updates: [facility] neurology Dr. Payton Mccallum Husseini.  Note not yet available in epic, however patient reports she was told she has been having multiple silent strokes based on the MRI imaging.  Neurology wonders if this is contributing to her chronic left-sided weakness.  Neurology has started her back on aspirin 81 mg daily and has her on a heart monitor patch.  She has a scheduled CT scan for 12/14 to evaluate the left-sided weakness.  She is scheduled for a right craniotomy with coil placement in the aneurysm at [facility] on 1/26.   PERTINENT  PMH / PSH: Hypertension, fibromuscular dysplasia, right MCA bifurcation aneurysm without rupture, left-sided weakness, cervical spondylosis, partially empty sella  OBJECTIVE:   BP 126/78   Pulse 100   Ht 5' (1.524 m)   Wt 181 lb (82.1 kg)   SpO2 100%   BMI 35.35 kg/m    PHQ-9:     01/10/2022   10:09 AM 10/09/2021    9:04 AM 07/18/2021    1:39 PM  Depression screen PHQ 2/9  Decreased Interest 1 1   Down, Depressed, Hopeless 1 1   PHQ - 2 Score 2 2   Altered sleeping 1 1   Tired, decreased energy 1 1   Change in appetite 1 1   Feeling bad or failure about yourself  1 0   Trouble concentrating 1 1   Moving slowly or fidgety/restless 1 0   Suicidal thoughts 0 0   PHQ-9 Score 8 6   Difficult doing work/chores Somewhat difficult        Information is confidential and restricted. Go to Review Flowsheets to unlock data.    Physical Exam General: Awake, alert, oriented Cardiovascular: Regular rate and rhythm, S1 and S2 present, no murmurs auscultated Respiratory: Lung fields clear to auscultation bilaterally  ASSESSMENT/PLAN:   Hypertension At goal.  Tolerating medication well.  Continue verapamil 120 mg and Benicar (olmesartan-HCTZ 20-1.5 mg).  BMP today to check renal function.  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  Stroke [facility]) [facility] neuro believes silent strokes based on MRI findings. Started back on aspirin 81 mg. No neuro note visible yet in Epic. Will follow.    FMLA paperwork  Completed with patient in room. Will place in RN inbox for outgoing fax.   Fayette Pho, MD [facility]

## 2022-02-06 NOTE — Assessment & Plan Note (Signed)
[facility] neuro believes silent strokes based on MRI findings. Started back on aspirin 81 mg. No neuro note visible yet in Epic. Will follow.

## 2022-02-06 NOTE — Assessment & Plan Note (Addendum)
At goal.  Tolerating medication well.  Continue verapamil 120 mg and Benicar (olmesartan-HCTZ 20-1.5 mg).  BMP today to check renal function.

## 2022-02-06 NOTE — Patient Instructions (Signed)
It was wonderful to see you today. Thank you for allowing me to be a part of your care. Below is a short summary of what we discussed at your visit today:  Blood pressure Your blood pressure today is beautiful! Keep taking the medicine as prescribed.  Today we got some blood work to check on your kidneys to see how they are doing with the new blood pressure medicine.  No changes to your medicines.   Mentally preparing for surgery I think it is important that you see a therapist before surgery to prepare yourself mentally and emotionally. See the list of therapists below. Please call to try and connect with a therapist as soon as possible.   Please bring all of your medications to every appointment! If you have any questions or concerns, please do not hesitate to contact us via phone or MyChart message.   Fayette Pho, MD   Therapy and Counseling Resources Most providers on this list will take Medicaid. Patients with commercial insurance or Medicare should contact their insurance company to get a list of in network providers.  [facility] (takes children) Location 1: [address] [place], [place] [address] Location 2: [address] [place], [place] [address] [phone]   [facility] (spanish speaking therapist available)(habla espanol)(take medicare and medicaid)  [address], [place], [place] [address], [place] [email or web address] [phone]  [facility]Psychiatry and Counseling [address] [facility] [place]. [address] [place], [place] [address] [phone]  [facility]   [address], Suite [place], [place] [address]      [phone]  [facility] (spanish available) [address]  [place], [place] [address] [phone]  [facility] (take [facility] and medicare) [address], [address]  [place], [place] [address]       [phone]     [facility] (virtual only) [phone]  Jovita Kussmaul [facility] [address], [place], [place] [phone]  [facility]:  [address] [place] [phone]  [facility]:  [address] [place] [place] [phone]  [facility] (under & uninsured) [address]   [place] [phone]    [email or web address]    [facility] [address]  [place]    [phone]  [facility] -[address]     Phone:  [phone]     [facility]-  [address]  [phone]   [facility] [address]      [place], [place] [phone]  [facility]: [address] [place], [place], [place]  [phone]   [facility] (Spanish therapist) [email or web address]  [address] [place]  [address]   [place] [address]    [phone]    [facility]   [address],  [place], [place]  [phone]   [facility]  [address] [place]  [phone]  [facility]  [address] [place], [place]        [phone]  Open Access/Walk In Clinic under & uninsured  [facility]  [address] [place], [place] [phone] Crisis [phone]  [facility] [address],  (Spanish)   [address], [place]: [phone]) 8:30 - 12; 1 - 2:30  [facility] [employer],  [address] [place], [place]    (  [phone]):8:30 - 12; 2 - 3PM  [facility] [address]  [address]  [place]; [phone]):   Mon - Fri 8 AM - 5 PM  [facility] [address] [place]  MWF 12:30 to 3:00 or call to schedule an appointment  [phone]  Specific Provider options Psychology Today  https://www.psychologytoday.com/us click on find a therapist  enter your zip code left side and select or tailor a therapist for your specific  need.   [facility] Provider Directory [email or web address]  (Medicaid)   Follow all drop down to find a provider  Social Support program [facility] [place] [phone] or PhotoSolver.pl [address], [place], [place] Recovery support and educational   24- Hour Availability:   [facility]  [address] [place], [place] [phone] Crisis [phone]  [facility] [phone]  [facility]  [phone]   [facility]  [phone] (after hours)  Therapeutic Alternative/Mobile Crisis   [phone]  [place] National Suicide Hotline  6673322399 Len Childs)  Call 911 or go to emergency room  [facility]  [phone]);  [place] and [employer]  [phone]); [place], [place], [place], [place], [place], [place], [place]

## 2022-02-07 ENCOUNTER — Ambulatory Visit (INDEPENDENT_AMBULATORY_CARE_PROVIDER_SITE_OTHER): Payer: Medicaid Other | Admitting: Licensed Clinical Social Worker

## 2022-02-07 ENCOUNTER — Encounter: Payer: Self-pay | Admitting: Neurology

## 2022-02-07 ENCOUNTER — Telehealth: Payer: Self-pay

## 2022-02-07 DIAGNOSIS — F333 Major depressive disorder, recurrent, severe with psychotic symptoms: Secondary | ICD-10-CM

## 2022-02-07 LAB — BASIC METABOLIC PANEL
BUN/Creatinine Ratio: 15 (ref 9–23)
BUN: 12 mg/dL (ref 6–24)
CO2: 23 mmol/L (ref 20–29)
Calcium: 9.6 mg/dL (ref 8.7–10.2)
Chloride: 100 mmol/L (ref 96–106)
Creatinine, Ser: 0.79 mg/dL (ref 0.57–1.00)
Glucose: 100 mg/dL — ABNORMAL HIGH (ref 70–99)
Potassium: 4.6 mmol/L (ref 3.5–5.2)
Sodium: 138 mmol/L (ref 134–144)
eGFR: 96 mL/min/{1.73_m2} (ref >59)

## 2022-02-07 NOTE — Progress Notes (Signed)
Virtual Visit via Video Note  I connected with Christine Gordon on 02/07/22 at  9:30 AM EST by a video enabled telemedicine application and verified that I am speaking with the correct person using two identifiers.  Location: Patient: Home   Provider: Virtual office   I discussed the limitations of evaluation and management by telemedicine and the availability of in person appointments. The patient expressed understanding and agreed to proceed.  History of Present Illness: Pt reports past history of depression due to medical issues and family relationships with her mother and siblings. She reports that she is the "black sheep" of her family. She reports that she continues to feel down, depressed, hopeless, and has fleeting suicidal ideations. She reports that she does not want to die and she wants to keep fighting to live.   Observations/Objective: Christine Gordon discusses in vague terms about her family relationships and stressors that add to her mental health symptoms. She reports that she copes by listening to gospel music, reading the bible, talking to her grandson, and talking to her adult children often. She reports that she has a surgery coming up on 03/29/2022 and her daughter will hopefully be able to be in town to support her during that time.  Assessment and Plan: Mental status assessed. Assessed for issues of dangerousness. PHQ-9 completed, score of 18. Risk assessment is Moderate. She is provided with information on how to access emergency or crisis services.  Follow Up Instructions: F/u in one month with another therapist in office due to this therapist leaving office.    I discussed the assessment and treatment plan with the patient. The patient was provided an opportunity to ask questions and all were answered. The patient agreed with the plan and demonstrated an understanding of the instructions.   The patient was advised to call back or seek an in-person evaluation if the symptoms  worsen or if the condition fails to improve as anticipated.  I provided 45 minutes of non-face-to-face time during this encounter.   Cheri Fowler, [facility]

## 2022-02-07 NOTE — Telephone Encounter (Signed)
FMLA faxed to number provided.   Copy made for batch scanning.

## 2022-02-07 NOTE — Telephone Encounter (Signed)
Pharmacy stated that her PA is not active and they well not refill her medication

## 2022-02-08 ENCOUNTER — Telehealth (INDEPENDENT_AMBULATORY_CARE_PROVIDER_SITE_OTHER): Payer: Medicaid Other | Admitting: Psychiatry

## 2022-02-08 DIAGNOSIS — F333 Major depressive disorder, recurrent, severe with psychotic symptoms: Secondary | ICD-10-CM | POA: Diagnosis not present

## 2022-02-08 MED ORDER — QUETIAPINE FUMARATE 50 MG PO TABS: 50.0000 mg | ORAL_TABLET | Freq: Every day | ORAL | 2 refills | Status: DC

## 2022-02-08 MED ORDER — DULOXETINE HCL 30 MG PO CPEP: 30.0000 mg | ORAL_CAPSULE | Freq: Two times a day (BID) | ORAL | 2 refills | Status: DC

## 2022-02-08 NOTE — Progress Notes (Addendum)
[place] MD/PA/NP OP Progress Note  02/08/2022 2:13 PM Christine Gordon  MRN:  [id number] Virtual Visit via Video Note  I connected with Christine Gordon on 02/08/22 at  2:00 PM EST by a video enabled telemedicine application and verified that I am speaking with the correct person using two identifiers.  Location: Patient: Home Provider: Clinic   I discussed the limitations of evaluation and management by telemedicine and the availability of in person appointments. The patient expressed understanding and agreed to proceed.  I discussed the assessment and treatment plan with the patient. The patient was provided an opportunity to ask questions and all were answered. The patient agreed with the plan and demonstrated an understanding of the instructions.   The patient was advised to call back or seek an in-person evaluation if the symptoms worsen or if the condition fails to improve as anticipated.  I provided 15 minutes of non-face-to-face time during this encounter.   Armando Reichert, MD   Chief Complaint:  Chief Complaint  Patient presents with   Follow-up   Medication Refill   Depression   Subjective: Patient is a 41 year old female with past psychiatric history of MDD with psychosis and medical history of brain aneurysm, stroke, HTN, HLD, back pain due to sciatica, cervical spondylosis, chronic myofascial pain, prediabetes, asthma and migraine presented to [facility] outpatient clinic virtually for medication management for depression.  Pt reports that her mood is "same". She reports that she was not able to follow-up with this provider as she was in and out of the hospital because of her growing brain aneurysm.  She reports that she has a brain surgery planned at [facility] on January 26.  She is requesting medication refills now.  She reports that medications has been helping her with depression.  She is worried about her upcoming brain surgery and has been having a lot of racing thoughts.  She is  reporting mood swings, feeling tired and stressed out.  She  reports that she has been having a lot of pain in her neck due to  problem with blood vessels and currently reporting a lot of pain .  She reports that she has not been able to sleep well at night due to pain.  She is still reporting poor appetite.  Currently, she denies any active suicidal ideations, and homicidal ideations.  She has chronic vague thoughts about hurting people from her past but denies any plan or intent. She reports that she always has these thoughts but has not hurt anybody.  She has chronic visual hallucinations of seeing little boy since childhood.  She denies auditory hallucinations.  She denies illicit drugs or alcohol use.  She does not smoke cigarettes.  Discussed increasing Seroquel for poor sleep, depression and racing thoughts.  She states that because of her upcoming brain surgery she does not want to change any of her medications.   She reports that she does not have any support system and has nobody to help her in case of any emergency.  She has been out of work due to brain aneurysm.  Past Psychiatric History: History of MDD with psychosis. She reports 2 past suicidal attempts when she was [age] years old by putting a knife to her throat and trying to overdose.  She reports that she never actually overdosed.  She denies past psychiatric hospitalizations.  She has never been on any psychotropic medications.  She gets therapy at [facility] every 2 weeks.  Past Medical History:  Past  Medical History:  Diagnosis Date   Abdominal bloating 12/22/2020   Bleeding nose 12/22/2020   Contraception management 10/09/2017   Depo-provera   COVID-19 12/22/2020   COVID infection June 2022.    H/O cesarean section    x3 perpatient   Healthcare maintenance 12/22/2020   Hypertension    Knee pain 04/04/2021   Microscopic hematuria 05/24/2020   Formatting of this note might be different from the original. Urinalysis 03/22 and  04/22. No urine infection Referred to Urology 05/22   Pain in both feet 10/31/2021   Routine screening for STI (sexually transmitted infection) 10/09/2021   Viral illness 11/21/2021   Vitamin D deficiency 07/21/2020    Past Surgical History:  Procedure Laterality Date   CESAREAN SECTION N/A    x3 per patient    Family Psychiatric History: None  Family History:  Family History  Problem Relation Age of Onset   Healthy Mother    Colon cancer Sister    High Cholesterol Maternal Aunt    High blood pressure Maternal Aunt    Diabetes Paternal Aunt    Diabetes Paternal Uncle    Diabetes Paternal Grandmother    Esophageal cancer Neg Hx    Pancreatic cancer Neg Hx    Stomach cancer Neg Hx     Social History:  Social History   Socioeconomic History   Marital status: Single    Spouse name: Not on file   Number of children: 4   Years of education: Not on file   Highest education level: Not on file  Occupational History   Not on file  Tobacco Use   Smoking status: Former    Types: Cigarettes    Quit date: 2019    Years since quitting: 4.9   Smokeless tobacco: Never  Vaping Use   Vaping Use: Never used  Substance and Sexual Activity   Alcohol use: Never   Drug use: Never   Sexual activity: Not on file  Other Topics Concern   Not on file  Social History Narrative   Ms. Hurlbut has 4 children: daughter (b ~57) and 3 sons (b.~2021),(b.2003), and (b. ~2005).   2 oldest sons live in [place] and her youngest son lives with her   Right handed   No caffeine   Social Determinants of Health   Financial Resource Strain: Not on file  Food Insecurity: Not on file  Transportation Needs: Not on file  Physical Activity: Not on file  Stress: Stress Concern Present (08/15/2021)   Alpha    Feeling of Stress : To some extent  Social Connections: Not on file    Allergies:  Allergies  Allergen Reactions    Nsaids Other (See Comments)    NSAIDs contraindicated due to cerebral aneurysm and fibromuscular dysplasia.   Triptans Other (See Comments)    Triptans are contraindicated due to cerebral aneurysm and fibromuscular dysplasia.     Metabolic Disorder Labs: No results found for: "HGBA1C", "MPG" No results found for: "PROLACTIN" No results found for: "CHOL", "TRIG", "HDL", "CHOLHDL", "VLDL", "LDLCALC" Lab Results  Component Value Date   TSH 2.15 04/23/2021    Therapeutic Level Labs: No results found for: "LITHIUM" No results found for: "VALPROATE" No results found for: "CBMZ"  Current Medications: Current Outpatient Medications  Medication Sig Dispense Refill   acetaminophen (TYLENOL 8 HOUR) 650 MG CR tablet Take 1 tablet (650 mg total) by mouth every 6 (six) hours as needed for pain  or fever. 30 tablet 1   albuterol (VENTOLIN HFA) 108 (90 Base) MCG/ACT inhaler Inhale 2 puffs into the lungs every 6 (six) hours as needed. 1 each 3   Alpha-D-Galactosidase (GAS-X PREVENTION) CAPS Take 80 capsules by mouth as directed. 180 capsule 1   aspirin EC 81 MG tablet Take 81 mg by mouth daily. Swallow whole.     benzonatate (TESSALON PERLES) 100 MG capsule Take 1 capsule (100 mg total) by mouth 3 (three) times daily as needed for cough. 20 capsule 0   Capsaicin-Menthol-Methyl Sal 0.001-10-20 % LOTN Apply to knee daily for pain relief. 113.4 g 3   Cholecalciferol 125 MCG (5000 UT) TABS Take by mouth.     [facility] SALINE NASAL SPRAY 0.65 % nasal spray SPRAY 1 SPRAY INTO EACH NOSTRIL AS NEEDED FOR CONGESTION 60 mL 11   cyclobenzaprine (FLEXERIL) 5 MG tablet Take 1 tablet (5 mg total) by mouth 3 (three) times daily as needed. 30 tablet 0   docusate sodium (COLACE) 250 MG capsule Take 1 capsule (250 mg total) by mouth daily. 30 capsule 3   DULoxetine (CYMBALTA) 30 MG capsule Take 1 capsule (30 mg total) by mouth 2 (two) times daily. 60 capsule 2   famotidine (PEPCID) 20 MG tablet Take 1 tablet (20 mg  total) by mouth at bedtime. 90 tablet 3   Galcanezumab-gnlm (EMGALITY) 120 MG/ML SOAJ Inject 120 mg into the skin every 28 (twenty-eight) days. 1.12 mL 5   guaiFENesin (MUCINEX) 600 MG 12 hr tablet Take 1 tablet (600 mg total) by mouth 2 (two) times daily. 14 tablet 0   ketoconazole (NIZORAL) 2 % cream Apply 1 Application topically daily. 60 g 2   lidocaine (XYLOCAINE) 5 % ointment APPLY TOPICALLY 1 APPLICATION AS NEEDED [id number] g 3   linaclotide (LINZESS) 290 MCG CAPS capsule Take 1 capsule (290 mcg total) by mouth daily before breakfast. 30 capsule 2   olmesartan-hydrochlorothiazide (BENICAR HCT) 20-12.5 MG tablet Take 1 tablet by mouth daily. 30 tablet 0   omeprazole (PRILOSEC) 40 MG capsule Take 1 capsule (40 mg total) by mouth daily. Take one capsule 30 minutes before breakfast daily. 90 capsule 3   QUEtiapine (SEROQUEL) 50 MG tablet Take 1 tablet (50 mg total) by mouth at bedtime. 30 tablet 2   rosuvastatin (CRESTOR) 10 MG tablet Take 1 tablet (10 mg total) by mouth daily. Please come to clinic lab for cholesterol check. 30 tablet 2   senna (SENOKOT) 8.6 MG TABS tablet Take 1 tablet (8.6 mg total) by mouth daily. 120 tablet 2   Ubrogepant (UBRELVY) 100 MG TABS Medication Samples have been provided to the patient.  Drug name: [name]       Strength: 100mg         Qty: 4  LOTXK:[id number]  Exp.Date: 08/2022  Dosing instructions:   The patient has been instructed regarding the correct time, dose, and frequency of taking this medication, including desired effects and most common side effects.   [name] 11:35 AM 05/22/2021 4 tablet 0   verapamil (CALAN-SR) 120 MG CR tablet Take 1 tablet (120 mg total) by mouth at bedtime. 90 tablet 3   zonisamide (ZONEGRAN) 100 MG capsule Take 1 capsule (100 mg total) by mouth daily. 30 capsule 5   No current facility-administered medications for this visit.     Musculoskeletal: Strength & Muscle Tone: Not evaluated due to virtual visit. Gait-not able  to evaluate due to virtual visit. Patient leans: N/A  Psychiatric Specialty Exam:  Review of Systems  There were no vitals taken for this visit.There is no height or weight on file to calculate BMI.  General Appearance: Casual  Eye Contact:  Fair  Speech:  Clear and Coherent and Normal Rate  Volume:  Normal  Mood:  Anxious and Dysphoric  Affect:  Congruent and Depressed  Thought Process:  Coherent  Orientation:  Full (Time, Place, and Person)  Thought Content: Hallucinations: Visual   Suicidal Thoughts:  No   Homicidal Thoughts:  Yes.  without intent/plan chronic vague thoughts about hurting people from her past but denies any plan or intent.   Memory:  Immediate;   Good Recent;   Fair Remote;   Fair  Judgement:  Fair  Insight:  Fair  Psychomotor Activity:  Normal  Concentration:  Concentration: Fair and Attention Span: Fair  Recall:  [employer] of Knowledge: Fair  Language: Good  Akathisia:  No  Handed:  Right  AIMS (if indicated): not done  Assets:  Communication Skills Desire for Improvement Housing Resilience Others:  employed  ADL's:  Intact  Cognition: WNL  Sleep:  Fair   Screenings: GAD-7    [place] Office Visit from 06/13/2021 in [place]  Total GAD-7 Score 13      PHQ2-9    [place] Counselor from 02/07/2022 in [facility] Office Visit from 01/10/2022 in [place] Office Visit from 10/09/2021 in [place] Counselor from 07/18/2021 in [facility] Office Visit from 06/13/2021 in [place]  PHQ-2 Total Score 6 2 2 6 4   PHQ-9 Total Score 18 8 6 14 9       Flow[place] Counselor from 02/07/2022 in [facility] ED from 01/08/2022 in [place] ED from 01/04/2022 in [place] CATEGORY  Moderate Risk No Risk No Risk        Assessment and Plan: Patient is a 41 year old female with past psychiatric history of MDD with psychosis (recently diagnosed) and medical history of HTN, HLD, back pain due to sciatica, cervical spondylosis, chronic myofascial pain, prediabetes, asthma and migraine presented to [facility] outpatient clinic for medication management. Patient has been tolerating her medications well without any side effects.  She has a lot of stressors including her increasing aneurysm and upcoming brain surgery in January.  She is reporting racing thoughts, anxiety, poor sleep and poor appetite.  Considered increasing Seroquel to help with sleep, depression and racing thoughts but patient wants to keep her medications at the same dose.  Will not make any changes at this time. Labs reviewed-EKG QTc 441, glucose 100, RFT WNL, lipid panel shows LDL 220, cholesterol 279, A1c 5.8. MDD with psychosis GAD -Continue Cymbalta to 30 mg twice daily. 30-day prescription with 2 refills sent to her pharmacy. -Continue Seroquel 50  mg nightly for mood stability.(QTc 441 ).30-day prescription with 2 refills sent to her pharmacy.  Follow-up In 2 months .  Patient will follow-up after her surgery   Collaboration of Care: Collaboration of Care: Dr Sande Rives  Patient/Guardian was advised Release of Information must be obtained prior to any record release in order to collaborate their care with an outside provider. Patient/Guardian was advised if they have not already done so to contact the registration department to sign all necessary forms in order for [place] to release information regarding their care.  Consent: Patient/Guardian gives verbal consent for treatment and assignment of benefits for services provided during this visit. Patient/Guardian expressed understanding and agreed to proceed.    Karsten Ro, MD 02/08/2022, 2:13 PM

## 2022-02-11 ENCOUNTER — Telehealth: Payer: Self-pay | Admitting: Pharmacy Technician

## 2022-02-11 ENCOUNTER — Other Ambulatory Visit (HOSPITAL_COMMUNITY): Payer: Self-pay

## 2022-02-11 NOTE — Telephone Encounter (Signed)
Patient switched to traditional Medicaid, need new PA for [facility].  Submitted a Prior Authorization request to [facility] MEDICAID for  Emgality  via CoverMyMeds. Will update once we receive a response.  Confirmation #:[id number]

## 2022-02-11 NOTE — Telephone Encounter (Signed)
Received notification from [facility] MEDICAID regarding a prior authorization for  Emgality 120mg  . Authorization has been APPROVED from 02/11/22 to 02/11/23.   Called pharmacy and advised.  Authorization # [id number]

## 2022-02-18 ENCOUNTER — Encounter (HOSPITAL_COMMUNITY): Payer: Self-pay | Admitting: Psychiatry

## 2022-02-19 ENCOUNTER — Other Ambulatory Visit: Payer: Self-pay | Admitting: Family Medicine

## 2022-03-19 ENCOUNTER — Encounter (HOSPITAL_COMMUNITY): Payer: Self-pay

## 2022-03-19 ENCOUNTER — Ambulatory Visit (INDEPENDENT_AMBULATORY_CARE_PROVIDER_SITE_OTHER): Payer: Medicaid Other | Admitting: Clinical

## 2022-03-19 DIAGNOSIS — F333 Major depressive disorder, recurrent, severe with psychotic symptoms: Secondary | ICD-10-CM

## 2022-03-19 NOTE — Progress Notes (Signed)
THERAPIST PROGRESS NOTE Virtual Visit via Video Note  I connected with Christine Gordon on 03/19/22 at  1:00 PM EST by a video enabled telemedicine application and verified that I am speaking with the correct person using two identifiers.  Location: Patient: home Provider: office   I discussed the limitations of evaluation and management by telemedicine and the availability of in person appointments. The patient expressed understanding and agreed to proceed.   Follow Up Instructions: I discussed the assessment and treatment plan with the patient. The patient was provided an opportunity to ask questions and all were answered. The patient agreed with the plan and demonstrated an understanding of the instructions.   The patient was advised to call back or seek an in-person evaluation if the symptoms worsen or if the condition fails to improve as anticipated.   Session Time: 30 minutes  Participation Level: Active  Behavioral Response: CasualAlertDepressed  Type of Therapy: Individual Therapy  Treatment Goals addressed: client will participate in 80% of scheduled individual psychotherapy sessions  ProgressTowards Goals: Initial  Interventions: CBT and Supportive  Summary:  Christine Gordon is a 42 y.o. female who presents for initial appointment with this therapist since transfer from [facility]. Client presented oriented times five, appropriately dressed, and friendly. Client denied hallucinations and delusions. Client reported she has been maintaining fairly well. Client reported her psychiatric medications have been helpful. Client reported she can tell positive improvement with anger control and being able to focus on herself more. Client reported her sleeping schedule is not good as she falls asleep at 7am or 8am each morning and gets a few hours of sleep. Client reported she is living with a growing brain aneurism. Client reported she has painful headaches that prevent her from  getting up to do daily activity. Client reported she was holding her head during the appointment because of the pressure. Client reported she is having brains surgery next Friday. Client reported the doctors feel very optimistic about the outcome of the surgery. Client reported she has been temporarily blocking her family members because they have caused a lot of her stress as well. Client reported since she has had her health diagnosis for 3 years her family has not called and checked on her. Client reported they gossip and try to find out through other people how she is doing. Client reported she just needs time while she has surgery and recovers before she allows them to contact her. Client reported her son lives with her and her daughter while be coming to town to help care for her during the surgery. Evidence of progress towards goal:  client reported medication compliance 7 days per week. Client reported utilizing 1 skill of enforcing boundaries to help improve her depression symptoms.   Suicidal/Homicidal: Nowithout intent/plan  Therapist Response:  Therapist began the appointment making introductions and discussing confidentiality. Therapist used CBT to engage using active listening and positive emotional support. Therapist used CBT to ask the client about her response to medication management. Therapist used CBT to engage and ask the client questions about her current mental health symptoms and identify psychosocial stressor that contribute to those emotions. Therapist used CBT to reinforce her use of positive coping skill. Therapist used CBT to complete SDOH and treatment plan. Therapist addressed questions and concerns. Client was scheduled for next appointment.   Plan: Return again in 4 weeks.  Diagnosis: MDD, recurrent ,severe, with psychosis  Collaboration of Care: Patient refused AEB none requested by the client.  Patient/Guardian  was advised Release of Information must be  obtained prior to any record release in order to collaborate their care with an outside provider. Patient/Guardian was advised if they have not already done so to contact the registration department to sign all necessary forms in order for [place] to release information regarding their care.   Consent: Patient/Guardian gives verbal consent for treatment and assignment of benefits for services provided during this visit. Patient/Guardian expressed understanding and agreed to proceed.   [place], LCSW 03/19/2022
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
## 2022-04-15 ENCOUNTER — Encounter: Payer: Self-pay | Admitting: Neurology

## 2022-04-15 ENCOUNTER — Ambulatory Visit (INDEPENDENT_AMBULATORY_CARE_PROVIDER_SITE_OTHER): Payer: Medicaid Other | Admitting: Neurology

## 2022-04-15 ENCOUNTER — Telehealth (INDEPENDENT_AMBULATORY_CARE_PROVIDER_SITE_OTHER): Payer: Medicaid Other | Admitting: Psychiatry

## 2022-04-15 VITALS — BP 138/78 | HR 91 | Resp 20 | Ht 61.0 in | Wt 193.0 lb

## 2022-04-15 DIAGNOSIS — F333 Major depressive disorder, recurrent, severe with psychotic symptoms: Secondary | ICD-10-CM | POA: Diagnosis not present

## 2022-04-15 DIAGNOSIS — I671 Cerebral aneurysm, nonruptured: Secondary | ICD-10-CM | POA: Diagnosis not present

## 2022-04-15 DIAGNOSIS — I773 Arterial fibromuscular dysplasia: Secondary | ICD-10-CM | POA: Diagnosis not present

## 2022-04-15 DIAGNOSIS — G43001 Migraine without aura, not intractable, with status migrainosus: Secondary | ICD-10-CM

## 2022-04-15 DIAGNOSIS — G43409 Hemiplegic migraine, not intractable, without status migrainosus: Secondary | ICD-10-CM

## 2022-04-15 DIAGNOSIS — I679 Cerebrovascular disease, unspecified: Secondary | ICD-10-CM

## 2022-04-15 MED ORDER — QUETIAPINE FUMARATE 50 MG PO TABS: 50.0000 mg | ORAL_TABLET | Freq: Every day | ORAL | 2 refills | Status: DC

## 2022-04-15 MED ORDER — UBRELVY 100 MG PO TABS: 1.0000 | ORAL_TABLET | ORAL | 5 refills | Status: AC | PRN

## 2022-04-15 MED ORDER — DULOXETINE HCL 30 MG PO CPEP: 30.0000 mg | ORAL_CAPSULE | Freq: Two times a day (BID) | ORAL | 2 refills | Status: DC

## 2022-04-15 NOTE — Progress Notes (Signed)
[place] MD/PA/NP OP Progress Note  04/15/2022 1:46 PM Christine Gordon  MRN:  [id number] Virtual Visit via Video Note  I connected with [facility] on 04/15/22 at  1:30 PM EST by a video enabled telemedicine application and verified that I am speaking with the correct person using two identifiers.  Location: Patient: Home Provider: Clinic   I discussed the limitations of evaluation and management by telemedicine and the availability of in person appointments. The patient expressed understanding and agreed to proceed.  I discussed the assessment and treatment plan with the patient. The patient was provided an opportunity to ask questions and all were answered. The patient agreed with the plan and demonstrated an understanding of the instructions.   The patient was advised to call back or seek an in-person evaluation if the symptoms worsen or if the condition fails to improve as anticipated.  I provided 15 minutes of non-face-to-face time during this encounter.   Armando Reichert, MD   Chief Complaint:  Chief Complaint  Patient presents with   Follow-up   Subjective: Patient is a 42 year old female with past psychiatric history of MDD with psychosis and medical history of brain aneurysm, stroke, HTN, HLD, back pain due to sciatica, cervical spondylosis, chronic myofascial pain, prediabetes, asthma and migraine presented to [facility] outpatient clinic virtually for medication management for depression.  Pt reports that her mood is "better". She reports that she had surgery  for growing brain aneurysm at [facility] on January 26.She reports that her daughter and cousin came to hospital to meet her when she was having surgery.   She still recovering and feels pain in her head when she talks. She reports that medications has been helping her with depression and her mood has been stable.  She reports improvement in her anxiety and depression after getting surgery.  She has been sleeping better and reports  variable appetite  Currently, she denies any active suicidal ideations, and homicidal ideations.  She has chronic vague thoughts about hurting people from her past but denies any plan or intent. She reports that she always has these thoughts but has not hurt anybody.  She contracts for safety at this time and will go to urgent care if her chronic homicidal thoughts get worse. She has chronic visual hallucinations of seeing little boy since childhood.  She does not get stressed by this.  She reports hearing her name occasionally but denies command hallucinations.  She denies illicit drugs or alcohol use.  She does not smoke cigarettes.  She wants to continue the same medications at this time.    Past Psychiatric History: History of MDD with psychosis. She reports 2 past suicidal attempts when she was 54-44 years old by putting a knife to her throat and trying to overdose.  She reports that she never actually overdosed.  She denies past psychiatric hospitalizations.  She has never been on any psychotropic medications.  She gets therapy at [facility] every 2 weeks.  Past Medical History:  Past Medical History:  Diagnosis Date   Abdominal bloating 12/22/2020   Bleeding nose 12/22/2020   Contraception management 10/09/2017   Depo-provera   COVID-19 12/22/2020   COVID infection June 2022.    H/O cesarean section    x3 perpatient   Healthcare maintenance 12/22/2020   Hypertension    Knee pain 04/04/2021   Microscopic hematuria 05/24/2020   Formatting of this note might be different from the original. Urinalysis 03/22 and 04/22. No urine infection Referred to  Urology 05/22   Pain in both feet 10/31/2021   Routine screening for STI (sexually transmitted infection) 10/09/2021   Viral illness 11/21/2021   Vitamin D deficiency 07/21/2020    Past Surgical History:  Procedure Laterality Date   CESAREAN SECTION N/A    x3 per patient    Family Psychiatric History: None  Family History:  Family History   Problem Relation Age of Onset   Healthy Mother    Colon cancer Sister    High Cholesterol Maternal Aunt    High blood pressure Maternal Aunt    Diabetes Paternal Aunt    Diabetes Paternal Uncle    Diabetes Paternal Grandmother    Esophageal cancer Neg Hx    Pancreatic cancer Neg Hx    Stomach cancer Neg Hx     Social History:  Social History   Socioeconomic History   Marital status: Single    Spouse name: Not on file   Number of children: 4   Years of education: Not on file   Highest education level: Not on file  Occupational History   Not on file  Tobacco Use   Smoking status: Former    Types: Cigarettes    Quit date: 2019    Years since quitting: 5.1   Smokeless tobacco: Never  Vaping Use   Vaping Use: Never used  Substance and Sexual Activity   Alcohol use: Never   Drug use: Never   Sexual activity: Not on file  Other Topics Concern   Not on file  Social History Narrative   Ms. Dimascio has 4 children: daughter (b ~59) and 3 sons (b.~2021),(b.2003), and (b. ~2005).   2 oldest sons live in [place] and her youngest son lives with her   Right handed   No caffeine   Social Determinants of Health   Financial Resource Strain: Not on file  Food Insecurity: Not on file  Transportation Needs: Not on file  Physical Activity: Not on file  Stress: Stress Concern Present (08/15/2021)   [place]    Feeling of Stress : To some extent  Social Connections: Not on file    Allergies:  Allergies  Allergen Reactions   Nsaids Other (See Comments)    NSAIDs contraindicated due to cerebral aneurysm and fibromuscular dysplasia.   Triptans Other (See Comments)    Triptans are contraindicated due to cerebral aneurysm and fibromuscular dysplasia.     Metabolic Disorder Labs: No results found for: "HGBA1C", "MPG" No results found for: "PROLACTIN" No results found for: "CHOL", "TRIG", "HDL", "CHOLHDL",  "VLDL", "LDLCALC" Lab Results  Component Value Date   TSH 2.15 04/23/2021    Therapeutic Level Labs: No results found for: "LITHIUM" No results found for: "VALPROATE" No results found for: "CBMZ"  Current Medications: Current Outpatient Medications  Medication Sig Dispense Refill   acetaminophen (TYLENOL 8 HOUR) 650 MG CR tablet Take 1 tablet (650 mg total) by mouth every 6 (six) hours as needed for pain or fever. 30 tablet 1   albuterol (VENTOLIN HFA) 108 (90 Base) MCG/ACT inhaler Inhale 2 puffs into the lungs every 6 (six) hours as needed. 1 each 3   Alpha-D-Galactosidase (GAS-X PREVENTION) CAPS Take 80 capsules by mouth as directed. 180 capsule 1   aspirin EC 81 MG tablet Take 81 mg by mouth daily. Swallow whole.     benzonatate (TESSALON PERLES) 100 MG capsule Take 1 capsule (100 mg total) by mouth 3 (three) times daily as  needed for cough. (Patient not taking: Reported on 04/15/2022) 20 capsule 0   Capsaicin-Menthol-Methyl Sal 0.001-10-20 % LOTN Apply to knee daily for pain relief. (Patient not taking: Reported on 04/15/2022) 113.4 g 3   Cholecalciferol 125 MCG (5000 UT) TABS Take by mouth.     [facility] SALINE NASAL SPRAY 0.65 % nasal spray SPRAY 1 SPRAY INTO EACH NOSTRIL AS NEEDED FOR CONGESTION 60 mL 11   cyclobenzaprine (FLEXERIL) 5 MG tablet Take 1 tablet (5 mg total) by mouth 3 (three) times daily as needed. 30 tablet 0   docusate sodium (COLACE) 250 MG capsule Take 1 capsule (250 mg total) by mouth daily. 30 capsule 3   DULoxetine (CYMBALTA) 30 MG capsule Take 1 capsule (30 mg total) by mouth 2 (two) times daily. 60 capsule 2   famotidine (PEPCID) 20 MG tablet Take 1 tablet (20 mg total) by mouth at bedtime. 90 tablet 3   Galcanezumab-gnlm (EMGALITY) 120 MG/ML SOAJ Inject 120 mg into the skin every 28 (twenty-eight) days. 1.12 mL 5   guaiFENesin (MUCINEX) 600 MG 12 hr tablet Take 1 tablet (600 mg total) by mouth 2 (two) times daily. (Patient not taking: Reported on 04/15/2022) 14 tablet  0   ketoconazole (NIZORAL) 2 % cream Apply 1 Application topically daily. 60 g 2   levETIRAcetam (KEPPRA) 1000 MG tablet Take by mouth.     lidocaine (XYLOCAINE) 5 % ointment APPLY TOPICALLY 1 APPLICATION AS NEEDED [id number] g 3   linaclotide (LINZESS) 290 MCG CAPS capsule Take 1 capsule (290 mcg total) by mouth daily before breakfast. 30 capsule 2   nortriptyline (PAMELOR) 25 MG capsule Take by mouth.     olmesartan-hydrochlorothiazide (BENICAR HCT) 20-12.5 MG tablet TAKE 1 TABLET BY MOUTH EVERY DAY 90 tablet 3   omeprazole (PRILOSEC) 40 MG capsule Take 1 capsule (40 mg total) by mouth daily. Take one capsule 30 minutes before breakfast daily. 90 capsule 3   ondansetron (ZOFRAN-ODT) 4 MG disintegrating tablet Take 4 mg by mouth every 8 (eight) hours as needed.     oxyCODONE (OXY IR/ROXICODONE) 5 MG immediate release tablet Take 5 mg by mouth every 6 (six) hours as needed.     QUEtiapine (SEROQUEL) 50 MG tablet Take 1 tablet (50 mg total) by mouth at bedtime. 30 tablet 2   rosuvastatin (CRESTOR) 10 MG tablet Take 1 tablet (10 mg total) by mouth daily. Please come to clinic lab for cholesterol check. 30 tablet 2   senna (SENOKOT) 8.6 MG TABS tablet Take 1 tablet (8.6 mg total) by mouth daily. 120 tablet 2   Ubrogepant (UBRELVY) 100 MG TABS Take 1 tablet (100 mg total) by mouth as needed. May repeat after 2 hours.  Maximum 2 tablets in 24 hours. 10 tablet 5   verapamil (CALAN-SR) 120 MG CR tablet Take 1 tablet (120 mg total) by mouth at bedtime. 90 tablet 3   zonisamide (ZONEGRAN) 100 MG capsule Take 1 capsule (100 mg total) by mouth daily. 30 capsule 5   No current facility-administered medications for this visit.     Musculoskeletal: Strength & Muscle Tone: Not evaluated due to virtual visit. Gait-not able to evaluate due to virtual visit. Patient leans: N/A  Psychiatric Specialty Exam: Review of Systems  There were no vitals taken for this visit.There is no height or weight on file to  calculate BMI.  General Appearance: Casual  Eye Contact:  Fair  Speech:  Clear and Coherent and Normal Rate  Volume:  Normal  Mood:  Anxious and Dysphoric  Affect:  Congruent and Depressed  Thought Process:  Coherent  Orientation:  Full (Time, Place, and Person)  Thought Content: Hallucinations: Visual (chronic), auditory hallucination somebody calling her name.  Denies command hallucinations.  Suicidal Thoughts:  No   Homicidal Thoughts:  Yes.  without intent/plan chronic vague thoughts of  hurting people from her past but denies any plan or intent.  Contracts for safety at this time.  Memory:  Immediate;   Good Recent;   Fair Remote;   Fair  Judgement:  Fair  Insight:  Fair  Psychomotor Activity:  Normal  Concentration:  Concentration: Fair and Attention Span: Fair  Recall:  [employer] of Knowledge: Fair  Language: Good  Akathisia:  No  Handed:  Right  AIMS (if indicated): not done  Assets:  Communication Skills Desire for Improvement Housing Resilience Others:  employed  ADL's:  Intact  Cognition: WNL  Sleep:  Good   Screenings: GAD-7    Flowsheet Row Office Visit from 06/13/2021 in [place]  Total GAD-7 Score 13      PHQ2-9    Flowsheet Row Counselor from 03/19/2022 in [facility] Counselor from 02/07/2022 in [facility] Office Visit from 01/10/2022 in [place] Office Visit from 10/09/2021 in [place] Counselor from 07/18/2021 in [facility]  PHQ-2 Total Score 6 6 2 2 6  $ PHQ-9 Total Score 18 18 8 6 14      $ Flowsheet Row Counselor from 02/07/2022 in [facility] ED from 01/08/2022 in [facility] Emergency Department at [facility] ED from 01/04/2022 in [facility] Emergency Department at [facility] Moderate Risk No Risk No Risk         Assessment and Plan: Patient is a 42 year old female with past psychiatric history of MDD with psychosis (recently diagnosed) and medical history of HTN, HLD, back pain due to sciatica, cervical spondylosis, chronic myofascial pain, prediabetes, asthma and migraine presented to [facility] outpatient clinic for medication management. Patient has been tolerating her medications well without any side effects.  She is still recovering from her recent brain aneurysm surgery. she is reporting stable mood and improvement in her depression and anxiety.  Will not make any changes at this time.  Labs reviewed-EKG QTc 441, glucose 100, RFT WNL, lipid panel shows LDL 220, cholesterol 279, A1c 5.8. MDD with psychosis GAD -Continue Cymbalta 30 mg twice daily. 30-day prescription with 2 refills sent to her pharmacy. -Continue Seroquel 50  mg nightly for mood stability.(QTc 441 ).30-day prescription with 2 refills sent to her pharmacy.  Follow-up In 2 months .    Collaboration of Care: Collaboration of Care: None  Patient/Guardian was advised Release of Information must be obtained prior to any record release in order to collaborate their care with an outside provider. Patient/Guardian was advised if they have not already done so to contact the registration department to sign all necessary forms in order for [place] to release information regarding their care.   Consent: Patient/Guardian gives verbal consent for treatment and assignment of benefits for services provided during this visit. Patient/Guardian expressed understanding and agreed to proceed.    Armando Reichert, MD 04/15/2022, 1:46 PM

## 2022-04-15 NOTE — Patient Instructions (Signed)
Sent prescription in for Ubrelvy.  May need prior authorization first Continue zonidamide, verapamil and nortriptyline.  If blood pressure becomes uncontrolled again, would consider discontinuing nortriptyline Limit use of pain relievers to no more than 2 days out of week to prevent risk of rebound or medication-overuse headache. Keep headache diary. Follow up 4 to 5 months.

## 2022-04-16 ENCOUNTER — Encounter: Payer: Self-pay | Admitting: Family Medicine

## 2022-04-16 ENCOUNTER — Ambulatory Visit (INDEPENDENT_AMBULATORY_CARE_PROVIDER_SITE_OTHER): Payer: Medicaid Other | Admitting: Family Medicine

## 2022-04-16 VITALS — BP 126/72 | HR 97 | Wt 194.6 lb

## 2022-04-16 DIAGNOSIS — G43001 Migraine without aura, not intractable, with status migrainosus: Secondary | ICD-10-CM | POA: Diagnosis present

## 2022-04-16 DIAGNOSIS — I639 Cerebral infarction, unspecified: Secondary | ICD-10-CM

## 2022-04-16 DIAGNOSIS — I10 Essential (primary) hypertension: Secondary | ICD-10-CM | POA: Diagnosis not present

## 2022-04-16 DIAGNOSIS — I671 Cerebral aneurysm, nonruptured: Secondary | ICD-10-CM | POA: Diagnosis not present

## 2022-04-16 DIAGNOSIS — F333 Major depressive disorder, recurrent, severe with psychotic symptoms: Secondary | ICD-10-CM | POA: Diagnosis not present

## 2022-04-16 MED ORDER — NORTRIPTYLINE HCL 25 MG PO CAPS: 25.0000 mg | ORAL_CAPSULE | Freq: Every day | ORAL | 3 refills | Status: DC

## 2022-04-16 NOTE — Assessment & Plan Note (Signed)
Patient notes some postop changes to her mood.  She notes that she feels more short tempered and as though she "lashes out" at people she loves.  She also notes that she talks to herself more than before.  Discussed that these changes should be brought up to her counselor and her psychiatrist.  I will send a staff message to them to notify.

## 2022-04-16 NOTE — Assessment & Plan Note (Addendum)
Decreased sensation of left side.  Objective strength appears equal bilaterally.  No obvious neurodeficits.  Continue aspirin 81 mg daily.

## 2022-04-16 NOTE — Progress Notes (Signed)
SUBJECTIVE:   CHIEF COMPLAINT / HPI:   Post-surgical follow up Ms. Bevans is here for hospital follow up. Was hospitalized at [facility] 1/26-28 for planned MCA aneurysm clipping on 03/29/2022 with Dr. Dorethea Clan.  While I do not have access to the full op note, patient verbally reports that Dr. Avis Epley told her she had some dried blood on the brain, possibly due to previous silent strokes, and that the aneurysm they clipped was starting to bleed.  Dr. Avis Epley told her she got to surgery "just in time".  Today, she reports that she is doing fairly well after the procedure.  She was noted to have good postop progress at her neurosurgery follow-up 2/8.  She is still taking her aspirin 81 mg daily as prescribed.   Today, she does report that her speech is not yet back to baseline.  She has also noticed some mood changes, such as being more short tempered and lashing out at people she loves after the procedure.  She is established with behavioral health and last saw Dr. Armando Reichert yesterday, 2/12 for care.  BP is well-controlled, although she does have a little bit of orthostatic dizziness. BP Readings from Last 3 Encounters:  04/16/22 126/72  04/15/22 138/78  02/06/22 126/78    She is currently having migraines, rates the days as 5 out of 10.  Last saw her neurologist Dr. Metta Clines at [facility] neurology yesterday 2/12 for care of migraines. At that time, she was recommended to continue zonidsamide, verapamil, and nortriptyline for prevention of migraines. Her migraine rescue medication was switched to Geraldine, although she is waiting for insurance approval and does not have the medication yet.  Future Appointments  Date Time Provider [place]  05/02/2022 10:30 AM Dorethea Clan, MD [facility] [id number]  06/28/2022 10:00 AM El Husseini, Elige Radon, MD [facility]   Patient reports she is planning to move to [place] this summer to be closer to family.  She would like to return  to her old PCP in [place].  Discussed how to go about records request, she says she will do this closer to the date of her move.  PERTINENT  PMH / PSH:  Patient Active Problem List   Diagnosis Date Noted   Stroke ([place]) 02/06/2022   Partially empty sella ([place]) 01/11/2022   MDD (major depressive disorder), recurrent, severe, with psychosis ([place]) 07/18/2021   Acute bilateral low back pain without sciatica 06/14/2021   Depressed mood 05/22/2021   Cervical spondylosis 04/04/2021   Chronic myofascial pain 04/04/2021   Chronic complaint of left-sided weakness 01/15/2021   Smoking    Hypertension 12/22/2020   Anxiety about dying 12/22/2020   Class 2 obesity due to excess calories with body mass index (BMI) of 36.0 to 36.9 in adult    Obstructive sleep apnea 12/21/2020   Fibromuscular dysplasia ([place]) 09/14/2020   Mixed hyperlipidemia 07/21/2020   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 04/09/2019   History of prediabetes 04/09/2019   Migraine without aura and with status migrainosus, not intractable 04/09/2019   Mild intermittent asthma without complication [id number]    OBJECTIVE:   BP 126/72   Pulse 97   Wt 194 lb 9.6 oz (88.3 kg)   LMP  (LMP Unknown)   SpO2 97%   BMI 36.77 kg/m    PHQ-9:     03/19/2022    3:52 PM 02/07/2022    9:37 AM 01/10/2022   10:09 AM  Depression screen PHQ 2/9  Decreased Interest 3 3 1  $ Down, Depressed, Hopeless 3 3 1  $ PHQ - 2 Score 6 6 2  $ Altered sleeping 3 3 1  $ Tired, decreased energy 3 3 1  $ Change in appetite 3 3 1  $ Feeling bad or failure about yourself  1 1 1  $ Trouble concentrating 1 1 1  $ Moving slowly or fidgety/restless 1  1  Suicidal thoughts 0 1 0  PHQ-9 Score 18 18 8  $ Difficult doing work/chores Very difficult Very difficult Somewhat difficult    Physical Exam General: Awake, alert, oriented Cardiovascular: Regular rate and rhythm, S1 and S2 present, no murmurs auscultated Respiratory: Lung fields clear to auscultation  bilaterally Extremities: No bilateral lower extremity edema, palpable pedal and pretibial pulses bilaterally Neuro: Cranial nerves II through X grossly intact, able to move all extremities spontaneously, equal smile, grip strength 5/5 and equal bilaterally, BUE push and pull 5/5 and equal bilaterally, sensation decreased in left hand and foot compared to right  ASSESSMENT/PLAN:   Stroke (HCC) Decreased sensation of left side.  Objective strength appears equal bilaterally.  No obvious neurodeficits.  Continue aspirin 81 mg daily.  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 Dorethea Clan.  Doing well postop.  Physical and neurological exams stable between neurosurgery follow-up 2/8, neurology yesterday, and my exam today.  Follow-up appointments at [facility] listed below.  05/02/2022 10:30 AM Dorethea Clan, MD [facility] MOB8  06/28/2022 10:00 AM El Husseini, Elige Radon, MD [facility]   Hypertension At goal today, no changes to regimen.   MDD (major depressive disorder), recurrent, severe, with psychosis ([place]) Patient notes some postop changes to her mood.  She notes that she feels more short tempered and as though she "lashes out" at people she loves.  She also notes that she talks to herself more than before.  Discussed that these changes should be brought up to her counselor and her psychiatrist.  I will send a staff message to them to notify.  Migraine without aura and with status migrainosus, not intractable Patient nearly out of her nortriptyline.  We attempted to request it on her MyChart app so we will go to Dr. Tomi Likens, however this was not possible on the app at this time.  I have sent in a prescription to the correct pharmacy so she does not run out.  Discussed that Dr. Tomi Likens at [facility] neurology is still the primary physician for this medication and that she should follow his recommendations regarding continuance or  cessation in the future.     Ezequiel Essex, MD

## 2022-04-16 NOTE — Assessment & Plan Note (Addendum)
S/p successful clipping and [facility] 03/29/2022 with Dr. Dorethea Clan.  Doing well postop.  Physical and neurological exams stable between neurosurgery follow-up 2/8, neurology yesterday, and my exam today.  Follow-up appointments at [facility] listed below.  05/02/2022 10:30 AM Dorethea Clan, MD [facility] MOB8  06/28/2022 10:00 AM El Husseini, Elige Radon, MD [facility]

## 2022-04-16 NOTE — Assessment & Plan Note (Signed)
Patient nearly out of her nortriptyline.  We attempted to request it on her MyChart app so we will go to Dr. Tomi Likens, however this was not possible on the app at this time.  I have sent in a prescription to the correct pharmacy so she does not run out.  Discussed that Dr. Tomi Likens at [facility] neurology is still the primary physician for this medication and that she should follow his recommendations regarding continuance or cessation in the future.

## 2022-04-16 NOTE — Assessment & Plan Note (Addendum)
At goal today, no changes to regimen.

## 2022-04-16 NOTE — Patient Instructions (Addendum)
It was wonderful to see you today. Thank you for allowing me to be a part of your care. Below is a short summary of what we discussed at your visit today:  Surgery follow up I am glad you are healing well after surgery! Your blood pressure is doing great. No changes to your medicines.  When you go back to [place], please call ahead and have [place] transfer the files.   Migraines I have sent the nortriptyline to your pharmacy so you don't run out.  Please ask your neurologist first for refills on the migraine medications.  I can fill things in a pinch to make sure you don't run out.    Please bring all of your medications to every appointment!  If you have any questions or concerns, please do not hesitate to contact us via phone or MyChart message.   Ezequiel Essex, MD

## 2022-04-17 ENCOUNTER — Emergency Department (HOSPITAL_COMMUNITY): Payer: Medicaid Other

## 2022-04-17 ENCOUNTER — Encounter (HOSPITAL_COMMUNITY): Payer: Self-pay

## 2022-04-17 ENCOUNTER — Emergency Department (HOSPITAL_COMMUNITY)
Admission: EM | Admit: 2022-04-17 | Discharge: 2022-04-17 | Disposition: A | Discharge status: Home / Self Care | Payer: Medicaid Other | Attending: Emergency Medicine | Admitting: Emergency Medicine

## 2022-04-17 DIAGNOSIS — Z7982 Long term (current) use of aspirin: Secondary | ICD-10-CM | POA: Diagnosis not present

## 2022-04-17 DIAGNOSIS — Z1152 Encounter for screening for COVID-19: Secondary | ICD-10-CM | POA: Diagnosis not present

## 2022-04-17 DIAGNOSIS — R519 Headache, unspecified: Secondary | ICD-10-CM | POA: Diagnosis not present

## 2022-04-17 DIAGNOSIS — R110 Nausea: Secondary | ICD-10-CM | POA: Diagnosis not present

## 2022-04-17 LAB — CBC WITH DIFFERENTIAL/PLATELET
Abs Immature Granulocytes: 0.02 10*3/uL (ref 0.00–0.07)
Basophils Absolute: 0 10*3/uL (ref 0.0–0.1)
Basophils Relative: 0 %
Eosinophils Absolute: 0.2 10*3/uL (ref 0.0–0.5)
Eosinophils Relative: 3 %
HCT: 34.3 % — ABNORMAL LOW (ref 36.0–46.0)
Hemoglobin: 11.4 g/dL — ABNORMAL LOW (ref 12.0–15.0)
Immature Granulocytes: 0 %
Lymphocytes Relative: 24 %
Lymphs Abs: 1.8 10*3/uL (ref 0.7–4.0)
MCH: 31.4 pg (ref 26.0–34.0)
MCHC: 33.2 g/dL (ref 30.0–36.0)
MCV: 94.5 fL (ref 80.0–100.0)
Monocytes Absolute: 0.4 10*3/uL (ref 0.1–1.0)
Monocytes Relative: 6 %
Neutro Abs: 4.9 10*3/uL (ref 1.7–7.7)
Neutrophils Relative %: 67 %
Platelets: 331 10*3/uL (ref 150–400)
RBC: 3.63 MIL/uL — ABNORMAL LOW (ref 3.87–5.11)
RDW: 12.6 % (ref 11.5–15.5)
WBC: 7.3 10*3/uL (ref 4.0–10.5)
nRBC: 0 % (ref 0.0–0.2)

## 2022-04-17 LAB — I-STAT CHEM 8, ED
BUN: 15 mg/dL (ref 6–20)
Calcium, Ion: 1.17 mmol/L (ref 1.15–1.40)
Chloride: 102 mmol/L (ref 98–111)
Creatinine, Ser: 0.7 mg/dL (ref 0.44–1.00)
Glucose, Bld: 141 mg/dL — ABNORMAL HIGH (ref 70–99)
HCT: 35 % — ABNORMAL LOW (ref 36.0–46.0)
Hemoglobin: 11.9 g/dL — ABNORMAL LOW (ref 12.0–15.0)
Potassium: 4.1 mmol/L (ref 3.5–5.1)
Sodium: 138 mmol/L (ref 135–145)
TCO2: 26 mmol/L (ref 22–32)

## 2022-04-17 LAB — RESP PANEL BY RT-PCR (RSV, FLU A&B, COVID)  RVPGX2
Influenza A by PCR: NEGATIVE
Influenza B by PCR: NEGATIVE
Resp Syncytial Virus by PCR: NEGATIVE
SARS Coronavirus 2 by RT PCR: NEGATIVE

## 2022-04-17 MED ORDER — MORPHINE SULFATE (PF) 4 MG/ML IV SOLN
4.0000 mg | Freq: Once | INTRAVENOUS | Status: AC
  Administered 2022-04-17: 4 mg via INTRAVENOUS
  Filled 2022-04-17: qty 1

## 2022-04-17 MED ORDER — IOHEXOL 350 MG/ML SOLN
75.0000 mL | Freq: Once | INTRAVENOUS | Status: AC | PRN
  Administered 2022-04-17: 75 mL via INTRAVENOUS

## 2022-04-17 MED ORDER — BUTALBITAL-APAP-CAFFEINE 50-325-40 MG PO TABS: 1.0000 | ORAL_TABLET | Freq: Four times a day (QID) | ORAL | 0 refills | Status: AC | PRN

## 2022-04-17 NOTE — Discharge Instructions (Addendum)
You have been evaluated for your symptoms.  Fortunately CT scan of your head did not show any new changes.  You may take Fioricet as needed for headache.  Your COVID flu and RSV test have not resulted yet but you can check the result through MyChart, link below.

## 2022-04-17 NOTE — ED Triage Notes (Addendum)
PT arrives via EMS from home. PT reports she had a Craniotomy for a clipping of a cerebral aneurysm at [facility] on 03/29/22. PT reports for the past couple days she has been having a migraine. Pt is currently AxOx4. Pt reports associated blurred vision and nausea last night. Pt has noted weakness to left arm and left leg, she states this has been present for quite some time.

## 2022-04-17 NOTE — ED Provider Triage Note (Signed)
Emergency Medicine Provider Triage Evaluation Note  Christine Gordon , a 42 y.o. female  was evaluated in triage.  Pt complains of headache.  She had aneurysm clipping at [facility] on 03/29/2022.  Her headache today began greater than 2 days ago and has gotten worse.  She has been taking her medications as described.  Has not been able to pick up her [name] yet due to insurance she had some blurred vision yesterday which has improved today.  Has residual left-sided upper and lower extremity weakness for "a few years."  No recent changes to this.  States today's headache is not the worst of her life. states her forehead is tingling but denies numbness or weakness anywhere  Review of Systems  Positive: As above Negative: As above  Physical Exam  BP 136/78   Pulse 95   Temp 98.2 F (36.8 C) (Oral)   Resp 18   LMP  (LMP Unknown)   SpO2 100%  Gen:   Awake, no distress   Resp:  Normal effort  MSK:   Moves extremities without difficulty  Other:  Left-sided pronator drift.  Normal finger-to-nose and heel-to-shin bilaterally.  Left-sided weakness compared to right.  Sensation intact in all extremities.  No facial asymmetry or slurred speech  Medical Decision Making  Medically screening exam initiated at 12:31 PM.  Appropriate orders placed.  Christine Gordon was informed that the remainder of the evaluation will be completed by another provider, this initial triage assessment does not replace that evaluation, and the importance of remaining in the ED until their evaluation is complete.  CTA head ordered. Will expedite. Outside window for TPA   Christine Gordon 04/17/22 1235

## 2022-04-17 NOTE — ED Notes (Signed)
No code stroke activation per [place], [place]

## 2022-04-17 NOTE — ED Provider Notes (Signed)
[place] Provider Note   CSN: [id number] Arrival date & time: 04/17/22  1208     History  Chief Complaint  Patient presents with   Migraine   Post-op Problem    Christine Gordon is a 42 y.o. female.  The history is provided by the patient and medical records. No language interpreter was used.  Migraine     42 year old female significant history of cerebral aneurysm status post clipping at [facility] on 03/29/2022 presenting complaining of headache.  She reported having recurrent headache, last seen by her neurologist 2 days ago for care for migraine and she is currently taking zonisamide, verapamil, and nortriptyline for prevention of migraine.  She is currently awaiting to have her rescue migraine medication, [name] to be approved by her insurance.  She mention for the past 2 to 3 days she has had pain in her head.  Pain is described as an aching sensation throughout her head with some burning sensation to her left forehead.  She endorsed nausea without vomiting.  She endorsed feeling globally weak with some congestion.  She voiced concerns because she was expecting her headache to resolved after her procedure.  She mention her headache is not quite as severe as previous.  She denies fever, chest pain shortness of breath.  She endorsed left-sided weakness and numbness which is chronic.  Home Medications Prior to Admission medications   Medication Sig Start Date End Date Taking? Authorizing Provider  acetaminophen (TYLENOL 8 HOUR) 650 MG CR tablet Take 1 tablet (650 mg total) by mouth every 6 (six) hours as needed for pain or fever. 11/21/21   Sharion Settler, DO  albuterol (VENTOLIN HFA) 108 (90 Base) MCG/ACT inhaler Inhale 2 puffs into the lungs every 6 (six) hours as needed. 11/20/21   Ezequiel Essex, MD  Alpha-D-Galactosidase (GAS-X PREVENTION) CAPS Take 80 capsules by mouth as directed. 11/21/21   Sharyn Creamer, MD  aspirin EC 81 MG tablet  Take 81 mg by mouth daily. Swallow whole.    [provider]  benzonatate (TESSALON PERLES) 100 MG capsule Take 1 capsule (100 mg total) by mouth 3 (three) times daily as needed for cough. Patient not taking: Reported on 04/15/2022 11/21/21   Sharion Settler, DO  Capsaicin-Menthol-Methyl Sal 0.001-10-20 % LOTN Apply to knee daily for pain relief. Patient not taking: Reported on 04/15/2022 11/20/21   Ezequiel Essex, MD  Cholecalciferol 125 MCG (5000 UT) TABS Take by mouth. 09/14/20   [provider]  [facility] SALINE NASAL SPRAY 0.65 % nasal spray SPRAY 1 SPRAY INTO EACH NOSTRIL AS NEEDED FOR CONGESTION 12/13/21   Ezequiel Essex, MD  cyclobenzaprine (FLEXERIL) 5 MG tablet Take 1 tablet (5 mg total) by mouth 3 (three) times daily as needed. 11/20/21   Ezequiel Essex, MD  docusate sodium (COLACE) 250 MG capsule Take 1 capsule (250 mg total) by mouth daily. 11/20/21   Ezequiel Essex, MD  DULoxetine (CYMBALTA) 30 MG capsule Take 1 capsule (30 mg total) by mouth 2 (two) times daily. 04/15/22   Armando Reichert, MD  famotidine (PEPCID) 20 MG tablet Take 1 tablet (20 mg total) by mouth at bedtime. 11/20/21 11/15/22  Ezequiel Essex, MD  Galcanezumab-gnlm [facility]) 120 MG/ML SOAJ Inject 120 mg into the skin every 28 (twenty-eight) days. 11/23/21   Pieter Partridge, DO  guaiFENesin (MUCINEX) 600 MG 12 hr tablet Take 1 tablet (600 mg total) by mouth 2 (two) times daily. Patient not taking: Reported on 04/15/2022 11/21/21  Sharion Settler, DO  ketoconazole (NIZORAL) 2 % cream Apply 1 Application topically daily. 12/25/21   Criselda Peaches, DPM  levETIRAcetam (KEPPRA) 1000 MG tablet Take by mouth. 03/30/22   [provider]  lidocaine (XYLOCAINE) 5 % ointment APPLY TOPICALLY 1 APPLICATION AS NEEDED [id number]   Ezequiel Essex, MD  linaclotide [facility]) 290 MCG CAPS capsule Take 1 capsule (290 mcg total) by mouth daily before breakfast. 11/21/21   Sharyn Creamer, MD  nortriptyline (PAMELOR) 25 MG  capsule Take 1 capsule (25 mg total) by mouth at bedtime. 04/16/22   Ezequiel Essex, MD  olmesartan-hydrochlorothiazide [facility]) 20-12.5 MG tablet TAKE 1 TABLET BY MOUTH EVERY DAY 02/19/22   Ezequiel Essex, MD  omeprazole (PRILOSEC) 40 MG capsule Take 1 capsule (40 mg total) by mouth daily. Take one capsule 30 minutes before breakfast daily. 11/21/21   Sharyn Creamer, MD  ondansetron (ZOFRAN-ODT) 4 MG disintegrating tablet Take 4 mg by mouth every 8 (eight) hours as needed. 03/30/22   [provider]  oxyCODONE (OXY IR/ROXICODONE) 5 MG immediate release tablet Take 5 mg by mouth every 6 (six) hours as needed. 04/05/22   [provider]  QUEtiapine (SEROQUEL) 50 MG tablet Take 1 tablet (50 mg total) by mouth at bedtime. 04/15/22   Armando Reichert, MD  rosuvastatin (CRESTOR) 10 MG tablet Take 1 tablet (10 mg total) by mouth daily. Please come to clinic lab for cholesterol check. 11/20/21   Ezequiel Essex, MD  senna (SENOKOT) 8.6 MG TABS tablet Take 1 tablet (8.6 mg total) by mouth daily. 11/20/21   Ezequiel Essex, MD  Ubrogepant (UBRELVY) 100 MG TABS Take 1 tablet (100 mg total) by mouth as needed. May repeat after 2 hours.  Maximum 2 tablets in 24 hours. 04/15/22   Pieter Partridge, DO  verapamil (CALAN-SR) 120 MG CR tablet Take 1 tablet (120 mg total) by mouth at bedtime. 01/28/22   Sharion Settler, DO  zonisamide (ZONEGRAN) 100 MG capsule Take 1 capsule (100 mg total) by mouth daily. 06/15/21   Pieter Partridge, DO      Allergies    Nsaids and Triptans    Review of Systems   Review of Systems  All other systems reviewed and are negative.   Physical Exam Updated Vital Signs BP 136/78   Pulse 95   Temp 98.2 F (36.8 C) (Oral)   Resp 18   LMP  (LMP Unknown)   SpO2 100%  Physical Exam Vitals and nursing note reviewed.  Constitutional:      General: She is not in acute distress.    Appearance: She is well-developed.  HENT:     Head: Normocephalic and atraumatic.  Eyes:      Conjunctiva/sclera: Conjunctivae normal.  Neck:     Comments: Diminished range of motion about the neck but this is chronic. Cardiovascular:     Rate and Rhythm: Normal rate and regular rhythm.     Pulses: Normal pulses.     Heart sounds: Normal heart sounds.  Pulmonary:     Effort: Pulmonary effort is normal.  Musculoskeletal:     Cervical back: Normal range of motion and neck supple. No rigidity.     Comments: Equal strength throughout all 4 extremities  Skin:    Findings: No rash.  Neurological:     Mental Status: She is alert. Mental status is at baseline.  Psychiatric:        Mood and Affect: Mood normal.     ED Results /  Procedures / Treatments   Labs (all labs ordered are listed, but only abnormal results are displayed) Labs Reviewed  CBC WITH DIFFERENTIAL/PLATELET - Abnormal; Notable for the following components:      Result Value   RBC 3.63 (*)    Hemoglobin 11.4 (*)    HCT 34.3 (*)    All other components within normal limits  I-STAT CHEM 8, ED - Abnormal; Notable for the following components:   Glucose, Bld 141 (*)    Hemoglobin 11.9 (*)    HCT 35.0 (*)    All other components within normal limits  RESP PANEL BY RT-PCR (RSV, FLU A&B, COVID)  RVPGX2    EKG None  Radiology CT Angio Head W or Wo Contrast  Result Date: 04/17/2022 CLINICAL DATA:  Sudden severe headache, recent craniotomy on 03/29/2022 status post aneurysm clipping. EXAM: CT ANGIOGRAPHY HEAD TECHNIQUE: Multidetector CT imaging of the head was performed using the standard protocol during bolus administration of intravenous contrast. Multiplanar CT image reconstructions and MIPs were obtained to evaluate the vascular anatomy. RADIATION DOSE REDUCTION: This exam was performed according to the departmental dose-optimization program which includes automated exposure control, adjustment of the mA and/or kV according to patient size and/or use of iterative reconstruction technique. CONTRAST:  31m  OMNIPAQUE IOHEXOL 350 MG/ML SOLN COMPARISON:  MR head 02/02/2022, CT head 01/23/2022 FINDINGS: CT HEAD Brain: There has been interval right pterional craniotomy for aneurysm clipping. There is dural thickening underlying the craniotomy site with 5 mm thick hypodense extra-axial fluid, likely postsurgical. There is no acute intracranial hemorrhage, extra-axial fluid collection, or acute infarct. Parenchymal volume is normal. The ventricles are normal in size. Gray-white differentiation is preserved. A small calcification in the medial right temporal lobe is unchanged. 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 There is no mass lesion. There is no mass effect or midline shift. Vascular: See below. Skull: Postsurgical changes reflecting right pterional craniotomy as above. There is no suspicious calvarial lesion or acute osseous abnormality. Sinuses: The imaged paranasal sinuses are clear. The globes and orbits are unremarkable. Other: None. CTA HEAD Anterior circulation: The intracranial ICAs are patent. The right M1 segment and distal branches are patent, without proximal stenosis or occlusion. 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 There is no definite residual or recurrent aneurysm, within the confines of streak artifact from the clip. The left MCA is patent, without proximal stenosis or occlusion. The bilateral ACAs are patent, without proximal stenosis or occlusion. The anterior communicating artery is normal. There is no new aneurysm. Posterior circulation: The bilateral V4 segments are patent. The basilar artery is patent. The major cerebellar arteries appear patent. The bilateral PCAs are patent, without proximal stenosis or occlusion. Left larger than right posterior communicating arteries are identified. There is no posterior circulation aneurysm Venous sinuses: Patent. Anatomic variants: None. Review of the MIP images confirms the above findings. IMPRESSION: 1. No acute intracranial  pathology. 2. Status post right pterional craniotomy for aneurysm clipping. No definite residual or recurrent aneurysm. 3. Patent intracranial vasculature. Electronically Signed   By: PValetta MoleM.D.   On: 04/17/2022 13:59    Procedures Procedures    Medications Ordered in ED Medications  iohexol (OMNIPAQUE) 350 MG/ML injection 75 mL (75 mLs Intravenous Contrast Given 04/17/22 1336)  morphine (PF) 4 MG/ML injection 4 mg (4 mg Intravenous Given 04/17/22 1409)    ED Course/ Medical Decision Making/ A&P  Medical Decision Making Risk Prescription drug management.   BP 136/78   Pulse 95   Temp 98.2 F (36.8 C) (Oral)   Resp 18   LMP  (LMP Unknown)   SpO2 100%   2:48 PM  42 year old female significant history of cerebral aneurysm status post clipping at [facility] on 03/29/2022 presenting complaining of headache.  She reported having recurrent headache, last seen by her neurologist 2 days ago for care for migraine and she is currently taking zonisamide, verapamil, and nortriptyline for prevention of migraine.  She is currently awaiting to have her rescue migraine medication, [name] to be approved by her insurance.  She mention for the past 2 to 3 days she has had pain in her head.  Pain is described as an aching sensation throughout her head with some burning sensation to her left forehead.  She endorsed nausea without vomiting.  She endorsed feeling globally weak with some congestion.  She voiced concerns because she was expecting her headache to resolved after her procedure.  She mention her headache is not quite as severe as previous.  She denies fever, chest pain shortness of breath.  She endorsed left-sided weakness and numbness which is chronic.  On exam, patient is laying bed appears to be in no acute discomfort.  She does not have any obvious focal neurodeficit.  She is able to range her neck slowly with slightly diminished range of motion but no nuchal  rigidity.  She does have some difficulty with eye tracking but this appears chronic.  Vital signs reviewed and overall reassuring no fever no hypoxia.  -Labs ordered, independently viewed and interpreted by me.  Labs remarkable for reassuring electrolytes panel -The patient was maintained on a cardiac monitor.  I personally viewed and interpreted the cardiac monitored which showed an underlying rhythm of: NSR -Imaging independently viewed and interpreted by me and I agree with radiologist's interpretation.  Result remarkable for CT angio of the head showing no acute finding -This patient presents to the ED for concern of headache, this involves an extensive number of treatment options, and is a complaint that carries with it a high risk of complications and morbidity.  The differential diagnosis includes migraine, tension, cluster, space occupying lesion, aneurysm, ICH -Co morbidities that complicate the patient evaluation includes brain aneurysm -Treatment includes morphine -Reevaluation of the patient after these medicines showed that the patient improved -PCP office notes or outside notes reviewed -Escalation to admission/observation considered: patients feels much better, is comfortable with discharge, and will follow up with PCP -Prescription medication considered, patient comfortable with fioricet for headache -Social Determinant of Health considered which includes stress and depression         Final Clinical Impression(s) / ED Diagnoses Final diagnoses:  Bad headache    Rx / DC Orders ED Discharge Orders          Ordered    butalbital-acetaminophen-caffeine (FIORICET) 50-325-40 MG tablet  Every 6 hours PRN        04/17/22 1506              Domenic Moras, PA-C 04/17/22 1507    Gareth Morgan, MD 04/18/22 1630

## 2022-04-18 ENCOUNTER — Encounter: Payer: Self-pay | Admitting: Family Medicine

## 2022-04-19 ENCOUNTER — Encounter: Payer: Self-pay | Admitting: Family Medicine

## 2022-04-19 DIAGNOSIS — I671 Cerebral aneurysm, nonruptured: Secondary | ICD-10-CM

## 2022-04-19 DIAGNOSIS — R479 Unspecified speech disturbances: Secondary | ICD-10-CM

## 2022-04-25 ENCOUNTER — Ambulatory Visit (HOSPITAL_COMMUNITY): Payer: Medicaid Other | Admitting: Clinical

## 2022-04-25 DIAGNOSIS — F333 Major depressive disorder, recurrent, severe with psychotic symptoms: Secondary | ICD-10-CM
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
## 2022-04-26 ENCOUNTER — Encounter (HOSPITAL_COMMUNITY): Payer: Self-pay

## 2022-04-26 ENCOUNTER — Emergency Department (HOSPITAL_COMMUNITY)
Admission: EM | Admit: 2022-04-26 | Discharge: 2022-04-26 | Disposition: A | Discharge status: Home / Self Care | Payer: Medicaid Other | Attending: Emergency Medicine | Admitting: Emergency Medicine

## 2022-04-26 ENCOUNTER — Other Ambulatory Visit: Payer: Self-pay

## 2022-04-26 ENCOUNTER — Encounter: Payer: Self-pay | Admitting: Neurology

## 2022-04-26 ENCOUNTER — Telehealth: Payer: Self-pay

## 2022-04-26 DIAGNOSIS — G43809 Other migraine, not intractable, without status migrainosus: Secondary | ICD-10-CM

## 2022-04-26 DIAGNOSIS — E876 Hypokalemia: Secondary | ICD-10-CM | POA: Insufficient documentation

## 2022-04-26 DIAGNOSIS — Z7982 Long term (current) use of aspirin: Secondary | ICD-10-CM | POA: Diagnosis not present

## 2022-04-26 DIAGNOSIS — Z8673 Personal history of transient ischemic attack (TIA), and cerebral infarction without residual deficits: Secondary | ICD-10-CM | POA: Insufficient documentation

## 2022-04-26 DIAGNOSIS — R519 Headache, unspecified: Secondary | ICD-10-CM | POA: Diagnosis not present

## 2022-04-26 LAB — BASIC METABOLIC PANEL
Anion gap: 10 (ref 5–15)
BUN: 14 mg/dL (ref 6–20)
CO2: 22 mmol/L (ref 22–32)
Calcium: 9 mg/dL (ref 8.9–10.3)
Chloride: 103 mmol/L (ref 98–111)
Creatinine, Ser: 0.77 mg/dL (ref 0.44–1.00)
GFR, Estimated: >60 mL/min (ref >60)
Glucose, Bld: 126 mg/dL — ABNORMAL HIGH (ref 70–99)
Potassium: 3.3 mmol/L — ABNORMAL LOW (ref 3.5–5.1)
Sodium: 135 mmol/L (ref 135–145)

## 2022-04-26 LAB — URINALYSIS, ROUTINE W REFLEX MICROSCOPIC
Bilirubin Urine: NEGATIVE
Glucose, UA: NEGATIVE mg/dL
Ketones, ur: NEGATIVE mg/dL
Leukocytes,Ua: NEGATIVE
Nitrite: NEGATIVE
Protein, ur: NEGATIVE mg/dL
Specific Gravity, Urine: 1.018 (ref 1.005–1.030)
pH: 5 (ref 5.0–8.0)

## 2022-04-26 LAB — CBC
HCT: 36.3 % (ref 36.0–46.0)
Hemoglobin: 11.7 g/dL — ABNORMAL LOW (ref 12.0–15.0)
MCH: 31.1 pg (ref 26.0–34.0)
MCHC: 32.2 g/dL (ref 30.0–36.0)
MCV: 96.5 fL (ref 80.0–100.0)
Platelets: 380 10*3/uL (ref 150–400)
RBC: 3.76 MIL/uL — ABNORMAL LOW (ref 3.87–5.11)
RDW: 13.2 % (ref 11.5–15.5)
WBC: 6.5 10*3/uL (ref 4.0–10.5)
nRBC: 0 % (ref 0.0–0.2)

## 2022-04-26 LAB — I-STAT BETA HCG BLOOD, ED (MC, WL, AP ONLY): I-stat hCG, quantitative: <5 m[IU]/mL (ref <5)

## 2022-04-26 LAB — SEDIMENTATION RATE: Sed Rate: 36 mm/hr — ABNORMAL HIGH (ref 0–22)

## 2022-04-26 LAB — CBG MONITORING, ED: Glucose-Capillary: 115 mg/dL — ABNORMAL HIGH (ref 70–99)

## 2022-04-26 MED ORDER — PROCHLORPERAZINE MALEATE 10 MG PO TABS: 10.0000 mg | ORAL_TABLET | Freq: Two times a day (BID) | ORAL | 0 refills | Status: DC | PRN

## 2022-04-26 MED ORDER — LACTATED RINGERS IV BOLUS
1000.0000 mL | Freq: Once | INTRAVENOUS | Status: AC
  Administered 2022-04-26: 1000 mL via INTRAVENOUS

## 2022-04-26 MED ORDER — TETRACAINE HCL 0.5 % OP SOLN
1.0000 [drp] | Freq: Once | OPHTHALMIC | Status: AC
  Administered 2022-04-26: 1 [drp] via OPHTHALMIC

## 2022-04-26 MED ORDER — PROCHLORPERAZINE EDISYLATE 10 MG/2ML IJ SOLN
10.0000 mg | Freq: Once | INTRAMUSCULAR | Status: AC
  Administered 2022-04-26: 10 mg via INTRAVENOUS
  Filled 2022-04-26: qty 2

## 2022-04-26 NOTE — Discharge Instructions (Signed)
You were evaluated in the emergency department, it is likely you had a worsening headache and possibly migraine.  I recommend you continue that follow-up with ophthalmology.  I am sending you home with Compazine, please take as needed.  Please continue to your appointment with your neurologist at [facility].  Return to the ED if you have any intractable pain, loss of vision, or weakness.

## 2022-04-26 NOTE — ED Provider Notes (Signed)
[facility] Provider Note   CSN: [id number] Arrival date & time: 04/26/22  1541     History  Chief Complaint  Patient presents with   Eye Pain   Headache    Christine Gordon is a 42 y.o. female.  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  She was seen on 04/17/2022 and underwent a CTA of her head which was negative.  She was given Fioricet which helped her symptoms and was discharged.  She is presenting again today because of right-sided headache, tenderness to palpation along the right side of her head, and blurry vision.  She is also having persistent numbness along her forehead and eyebrows to the right side which has been occurring since her procedure in January.  She denies any fevers, chills, chest pain, shortness of breath.     Home Medications Prior to Admission medications   Medication Sig Start Date End Date Taking? Authorizing Provider  acetaminophen (TYLENOL 8 HOUR) 650 MG CR tablet Take 1 tablet (650 mg total) by mouth every 6 (six) hours as needed for pain or fever. 11/21/21   Sharion Settler, DO  albuterol (VENTOLIN HFA) 108 (90 Base) MCG/ACT inhaler Inhale 2 puffs into the lungs every 6 (six) hours as needed. 11/20/21   Ezequiel Essex, MD  Alpha-D-Galactosidase (GAS-X PREVENTION) CAPS Take 80 capsules by mouth as directed. 11/21/21   Sharyn Creamer, MD  aspirin EC 81 MG tablet Take 81 mg by mouth daily. Swallow whole.    [provider]  benzonatate (TESSALON PERLES) 100 MG capsule Take 1 capsule (100 mg total) by mouth 3 (three) times daily as needed for cough. Patient not taking: Reported on 04/15/2022 11/21/21   Sharion Settler, DO  butalbital-acetaminophen-caffeine (FIORICET) [phone] MG tablet Take 1-2 tablets by mouth every 6 (six) hours as needed for headache. 04/17/22 04/17/23  Domenic Moras, PA-C  Capsaicin-Menthol-Methyl Sal 0.001-10-20 % LOTN Apply to knee daily for pain relief. Patient not taking: Reported on 04/15/2022 11/20/21   Ezequiel Essex, MD  Cholecalciferol 125 MCG (5000 UT) TABS Take by mouth. 09/14/20   [provider]  [facility] SALINE NASAL SPRAY 0.65 % nasal spray SPRAY 1 SPRAY INTO EACH NOSTRIL AS NEEDED FOR CONGESTION 12/13/21   Ezequiel Essex, MD  cyclobenzaprine (FLEXERIL) 5 MG tablet Take 1 tablet (5 mg total) by mouth 3 (three) times daily as needed. 11/20/21   Ezequiel Essex, MD  docusate sodium (COLACE) 250 MG capsule Take 1 capsule (250 mg total) by mouth daily. 11/20/21   Ezequiel Essex, MD  DULoxetine (CYMBALTA) 30 MG capsule Take 1 capsule (30 mg total) by mouth 2 (two) times daily. 04/15/22   Armando Reichert, MD  famotidine (PEPCID) 20 MG tablet Take 1 tablet (20 mg total) by mouth at bedtime. 11/20/21 11/15/22  Ezequiel Essex, MD  Galcanezumab-gnlm [facility]) 120 MG/ML SOAJ Inject 120 mg into the skin every 28 (twenty-eight) days. 11/23/21   Pieter Partridge, DO  guaiFENesin (MUCINEX) 600 MG 12 hr tablet Take 1 tablet (600 mg total) by mouth 2 (two) times daily. Patient not taking: Reported on 04/15/2022 11/21/21   Sharion Settler, DO  ketoconazole (NIZORAL) 2 % cream Apply 1 Application topically daily. 12/25/21   McDonald,  Stephan Minister, DPM  levETIRAcetam (KEPPRA) 1000 MG tablet Take by mouth. 03/30/22   [provider]  lidocaine (XYLOCAINE) 5 % ointment APPLY TOPICALLY 1 APPLICATION AS NEEDED [id number]   Ezequiel Essex, MD  linaclotide [facility]) 290 MCG CAPS capsule Take 1 capsule (290 mcg total) by mouth daily before breakfast. 11/21/21   Sharyn Creamer, MD  nortriptyline (PAMELOR) 25 MG capsule Take 1 capsule (25 mg total) by mouth at bedtime. 04/16/22   Ezequiel Essex, MD   olmesartan-hydrochlorothiazide [facility]) 20-12.5 MG tablet TAKE 1 TABLET BY MOUTH EVERY DAY 02/19/22   Ezequiel Essex, MD  omeprazole (PRILOSEC) 40 MG capsule Take 1 capsule (40 mg total) by mouth daily. Take one capsule 30 minutes before breakfast daily. 11/21/21   Sharyn Creamer, MD  ondansetron (ZOFRAN-ODT) 4 MG disintegrating tablet Take 4 mg by mouth every 8 (eight) hours as needed. 03/30/22   [provider]  oxyCODONE (OXY IR/ROXICODONE) 5 MG immediate release tablet Take 5 mg by mouth every 6 (six) hours as needed. 04/05/22   [provider]  QUEtiapine (SEROQUEL) 50 MG tablet Take 1 tablet (50 mg total) by mouth at bedtime. 04/15/22   Armando Reichert, MD  rosuvastatin (CRESTOR) 10 MG tablet Take 1 tablet (10 mg total) by mouth daily. Please come to clinic lab for cholesterol check. 11/20/21   Ezequiel Essex, MD  senna (SENOKOT) 8.6 MG TABS tablet Take 1 tablet (8.6 mg total) by mouth daily. 11/20/21   Ezequiel Essex, MD  Ubrogepant (UBRELVY) 100 MG TABS Take 1 tablet (100 mg total) by mouth as needed. May repeat after 2 hours.  Maximum 2 tablets in 24 hours. 04/15/22   Pieter Partridge, DO  verapamil (CALAN-SR) 120 MG CR tablet Take 1 tablet (120 mg total) by mouth at bedtime. 01/28/22   Sharion Settler, DO  zonisamide (ZONEGRAN) 100 MG capsule Take 1 capsule (100 mg total) by mouth daily. 06/15/21   Pieter Partridge, DO      Allergies    Nsaids and Triptans    Review of Systems   Review of Systems  Constitutional:  Negative for chills and fever.  HENT:  Positive for facial swelling. Negative for trouble swallowing and voice change.   Eyes:  Positive for pain and visual disturbance.  Neurological:  Positive for numbness and headaches.    Physical Exam Updated Vital Signs BP 139/85   Pulse 93   Temp 99 F (37.2 C)   Resp 12   Ht 5' (1.524 m)   Wt 88 kg   LMP  (LMP Unknown)   SpO2 100%   BMI 37.89 kg/m  Physical Exam Vitals and nursing note reviewed.   Constitutional:      General: She is not in acute distress.    Appearance: She is well-developed.  HENT:     Head: Normocephalic and atraumatic.  Eyes:     General: No visual field deficit.    Extraocular Movements: Extraocular movements intact.     Right eye: Normal extraocular motion and no nystagmus.     Left eye: Normal extraocular motion and no nystagmus.     Conjunctiva/sclera: Conjunctivae normal.     Pupils: Pupils are equal, round, and reactive to light.  Cardiovascular:     Rate and Rhythm: Normal rate and regular rhythm.     Heart sounds: No murmur heard. Pulmonary:     Effort: Pulmonary effort is normal. No respiratory distress.     Breath sounds: Normal breath sounds.  Abdominal:     Palpations: Abdomen is soft.     Tenderness: There is no abdominal tenderness.  Musculoskeletal:        General: No swelling.     Cervical back: Neck supple.  Skin:    General: Skin is warm and dry.     Capillary Refill: Capillary refill takes less than 2 seconds.  Neurological:     Mental Status: She is alert and oriented to person, place, and time.     GCS: GCS eye subscore is 4. GCS verbal subscore is 5. GCS motor subscore is 6.     Cranial Nerves: No cranial nerve deficit or facial asymmetry.     Sensory: Sensory deficit (Chronic left side greater than right sensory deficit) present.     Motor: No weakness.  Psychiatric:        Mood and Affect: Mood normal.     ED Results / Procedures / Treatments   Labs (all labs ordered are listed, but only abnormal results are displayed) Labs Reviewed  CBG MONITORING, ED - Abnormal; Notable for the following components:      Result Value   Glucose-Capillary 115 (*)    All other components within normal limits  BASIC METABOLIC PANEL  CBC  URINALYSIS, ROUTINE W REFLEX MICROSCOPIC  I-STAT BETA HCG BLOOD, ED ([facility], [facility], [facility] ONLY)    EKG None  Radiology No results found.  Procedures Procedures    Medications Ordered in  ED Medications - No data to display  ED Course/ Medical Decision Making/ A&P                             Medical Decision Making She has complex medical history including stroke and aneurysm which has been repaired.  She is presenting for similar symptoms to her last visit on 04/17/2022 in which she had a negative CTA of her head.  She states the Fioricet did help her headache and her symptoms but she is out of it.  On exam she has no focal neurologic deficits, pupils are equal and reactive, no visual field cut, 20/25 vision in both eyes.  Has no limbic injection or conjunctivitis to suggest iritis or infection.  Differential diagnosis includes CRAO, stroke, migraine, iritis, conjunctivitis, giant cell arteritis, glaucoma.  I attempted to obtain eye pressures in patient's eyes however she was unable to tolerate the exam with the Tono-Pen.  She has good eyelid movement and eyes feel by palpation equal, she does not have proptosis and currently denies any blurry vision.  Cannot rule out glaucoma however I have lower suspicion for this at this time as she has no pain on extraocular movement and her vision is intact.  He has no focal neurologic deficits, no vision loss, has baseline left-sided symptoms, low suspicion for acute ischemic stroke as she has had symptoms for at least 10 days.  Plan to send an ESR as she does have some temporal tenderness to help evaluate for giant cell arteritis.  Compazine IV given as well as fluids for migraine.  I personally reviewed and interpreted patient's labs which showed mild hypokalemia of 3.3 otherwise normal CMP, hemoglobin stable, sed rate 36.  Patient does not meet the rheumatologic criteria for giant cell arteritis given low CRP, age less than 52, no jaw claudication or diplopia, and chronic headache.  Urinalysis noninfectious, hCG negative.  I personally reviewed and interpreted patient's EKG which shows sinus rhythm with a rate of  92, similar to prior, no  acute ischemic changes.  Upon reevaluation patient symptoms have improved after Compazine, I discussed with her this is likely migraine/complex migraine.  She has no visual symptoms and no focal neurologic deficits on repeat examination.  Plan to send her home with prescription for Compazine as needed, she has an appointment with the provider who performed her aneurysmal clipping in 6 days.  Return precautions given if she has persistent intractable migraine, inability tolerate oral intake, or vision loss.  Patient was stable at discharge.  Amount and/or Complexity of Data Reviewed External Data Reviewed: notes.    Details: Per chart review patient underwent clipping on 03/29/2022 at [facility]. Labs: ordered. Decision-making details documented in ED Course. ECG/medicine tests: ordered and independent interpretation performed. Decision-making details documented in ED Course.  Risk Prescription drug management.          Final Clinical Impression(s) / ED Diagnoses Final diagnoses:  None    Rx / DC Orders ED Discharge Orders     None         Jimmie Molly, MD 04/26/22 Judith Blonder    Lajean Saver, MD 04/26/22 2150

## 2022-04-26 NOTE — Telephone Encounter (Signed)
Sent to [employer]

## 2022-04-26 NOTE — ED Triage Notes (Signed)
PER EMS: pt is from home with c/o right eye pain and headache/pressure on the right side of her head, describes it was "tightening up" and "tingling and numb" across her forehead. She had a craniotomy for a clipping of a cerebral aneurysm at [facility] on 03/29/22. She was seen here on 2/14 for similar complaints but states her right eye pain is now constant and worse. A&OX4.  BP- 132/98, HR-90, RR-18, O2-99%, CBG-179

## 2022-04-29 ENCOUNTER — Other Ambulatory Visit: Payer: Self-pay | Admitting: Family Medicine

## 2022-04-29 ENCOUNTER — Telehealth: Payer: Self-pay

## 2022-04-29 NOTE — Progress Notes (Signed)
Letter for disability lawyer as requested by patient.  Ezequiel Essex, MD

## 2022-04-29 NOTE — Telephone Encounter (Signed)
Pa needed for Urbelvy per patient.

## 2022-04-30 ENCOUNTER — Encounter (HOSPITAL_COMMUNITY): Payer: Self-pay | Admitting: Psychiatry

## 2022-04-30 ENCOUNTER — Encounter: Payer: Self-pay | Admitting: Family Medicine

## 2022-04-30 ENCOUNTER — Ambulatory Visit (INDEPENDENT_AMBULATORY_CARE_PROVIDER_SITE_OTHER): Payer: Medicaid Other | Admitting: Family Medicine

## 2022-04-30 VITALS — BP 110/70 | HR 97 | Ht 60.0 in | Wt 196.0 lb

## 2022-04-30 DIAGNOSIS — G43001 Migraine without aura, not intractable, with status migrainosus: Secondary | ICD-10-CM | POA: Diagnosis not present

## 2022-04-30 DIAGNOSIS — M545 Low back pain, unspecified: Secondary | ICD-10-CM | POA: Diagnosis not present

## 2022-04-30 DIAGNOSIS — I10 Essential (primary) hypertension: Secondary | ICD-10-CM | POA: Diagnosis not present

## 2022-04-30 MED ORDER — LIDOCAINE 5 % EX OINT: TOPICAL_OINTMENT | CUTANEOUS | 3 refills | Status: AC

## 2022-04-30 MED ORDER — PROCHLORPERAZINE MALEATE 10 MG PO TABS: 10.0000 mg | ORAL_TABLET | Freq: Two times a day (BID) | ORAL | 0 refills | Status: AC | PRN

## 2022-04-30 NOTE — Assessment & Plan Note (Signed)
Given that Compazine worked well for her in the ED and yesterday, I will send in a prescription for this that she has some at home.  Encouraged her to follow-up with her neurology office regarding the prior Auth for Ubrelvy.

## 2022-04-30 NOTE — Patient Instructions (Addendum)
It was wonderful to see you today. Thank you for allowing me to be a part of your care. Below is a short summary of what we discussed at your visit today:  Headaches I have given you more compazine.  Follow up with your neurologist about the prior auth paperwork for the Ubrelvy.  Since the compazine works so well, I don't know that we have to worry about the trigeminal neuralgia we talked about.   Blood pressure Your medicine is working well! It is 110/70 which is BEAUTIFUL! Your blood pressure today is excellent.  No changes to meds today.   Neck and foot pain You CANNOT use NSAIDs like ibuprofen or aleve.  You can use tylenol for aches and pains.  I have prescribed a lidocaine cream you can apply to the areas.   Disability paperwork You signed a release of information to have [place] send you your office notes from our clinic.  You can give these to whomever needs them for your disability claim.  I have printed off and provided the note and copies of your FMLA paperwork   Please bring all of your medications to every appointment!  If you have any questions or concerns, please do not hesitate to contact us via phone or MyChart message.   Ezequiel Essex, MD

## 2022-04-30 NOTE — Progress Notes (Signed)
SUBJECTIVE:   CHIEF COMPLAINT / HPI:   Hypertension Currently controlled with olmesartan-HCTZ 20-12.5 mg tablets.  Tolerating well, no adverse side effects.  BP Readings from Last 3 Encounters:  04/30/22 110/70  04/26/22 110/73  04/17/22 126/65    Disability paperwork Patient request letter stating will medical diagnoses make it difficult for her to work.  Wrote a letter for her, along with providing copies of prior FMLA paperwork submitted before her brain surgery.  Also signed release of information so she can get copies of her clinic notes if needed for disability claim.  Migraines Known migraines, is under care of neurology for these.  Was sent in prescription for Ubrelvy on 2/12 but if she is still waiting on the medication given need for prior Auth.  Previously tried [employer], but it "made her feel paranoid".  Unable to take triptans or NSAIDs due to cerebral aneurysm and fibromuscular dysplasia.  She was seen in the ED 2/14, given Fioricet with good relief.  Evaluated again in the ED 2/23, responded well to Compazine at that time.  She was sent home with a Compazine prescription.  Today, she reports that she continues to have right-sided headaches with sharp posterior right eye pain and a "tightening feeling" over her right lateral face.  She reports her right cheek also feels more swollen.  She was quite hesitant to take her Compazine prescription from the ED because when she looked it up online, she Seen references to "end-of-life" and wanted to make sure that this medication would not hasten her death or hurt her anyway.  She did take it yesterday and noted that it "knocked out headache and pain in about 30 minutes".  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   Acute bilateral low back pain  without sciatica 06/14/2021   Depressed mood 05/22/2021   Cervical spondylosis 04/04/2021   Chronic myofascial pain 04/04/2021   Chronic complaint of left-sided weakness 01/15/2021   Smoking    Hypertension 12/22/2020   Anxiety about dying 12/22/2020   Class 2 obesity due to excess calories with body mass index (BMI) of 36.0 to 36.9 in adult    Obstructive sleep apnea 12/21/2020   Fibromuscular dysplasia ([place]) 09/14/2020   Mixed hyperlipidemia 07/21/2020   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 04/09/2019   History of prediabetes 04/09/2019   Migraine without aura and with status migrainosus, not intractable 04/09/2019   Mild intermittent asthma without complication [id number]    OBJECTIVE:   BP 110/70   Pulse 97   Ht 5' (1.524 m)   Wt 196 lb (88.9 kg)   LMP  (LMP Unknown)   SpO2 97%   BMI 38.28 kg/m    General: Awake, alert, NAD HEENT: Sclera anicteric and noninjected, EOM intact, PERRL, right eye sensitive to light, skin over lateral right eyebrow and cheek exquisitely tender to touch but without erythema or heat Cardiac: Regular rate and rhythm, no murmurs Respiratory: CTAB, no wheezing  ASSESSMENT/PLAN:   Hypertension At goal today on olmesartan-HCTZ 20-12.5 mg.   Migraine without aura and with status migrainosus, not intractable Given that Compazine worked well for her in the ED and yesterday, I will send in a prescription for this that she has some at home.  Encouraged her to follow-up with her neurology office regarding the  prior Auth for [employer].   Disability claim Patient requesting letter from physician stating the conditions which make it difficult for her to work.  I have written such a letter and provided this to her.  She also filled out a release of information to get clinic notes if needed for her case.  Ezequiel Essex, MD [place]

## 2022-04-30 NOTE — Assessment & Plan Note (Signed)
At goal today on olmesartan-HCTZ 20-12.5 mg.

## 2022-05-07 ENCOUNTER — Ambulatory Visit (INDEPENDENT_AMBULATORY_CARE_PROVIDER_SITE_OTHER): Payer: Medicaid Other | Admitting: Podiatry

## 2022-05-07 DIAGNOSIS — M7752 Other enthesopathy of left foot: Secondary | ICD-10-CM

## 2022-05-07 DIAGNOSIS — M778 Other enthesopathies, not elsewhere classified: Secondary | ICD-10-CM | POA: Diagnosis not present

## 2022-05-07 DIAGNOSIS — M19072 Primary osteoarthritis, left ankle and foot: Secondary | ICD-10-CM

## 2022-05-07 DIAGNOSIS — G588 Other specified mononeuropathies: Secondary | ICD-10-CM

## 2022-05-07 MED ORDER — CHOLECALCIFEROL 125 MCG (5000 UT) PO TABS: 5000.0000 [IU] | ORAL_TABLET | Freq: Every day | ORAL | 0 refills | Status: DC

## 2022-05-07 NOTE — Telephone Encounter (Signed)
Received notification from [place] regarding a prior authorization for  Ubrelvy '100mg'$  . Authorization has been APPROVED from 05/07/22 to 05/07/23.   Authorization # [id number]

## 2022-05-07 NOTE — Addendum Note (Signed)
Addended by: Talbot Grumbling on: 05/07/2022 10:48 AM   Modules accepted: Orders

## 2022-05-07 NOTE — Progress Notes (Signed)
  Subjective:  Patient ID: Christine Gordon, female    DOB: [birth year],  MRN: [id number]  Chief Complaint  Patient presents with   Foot Pain    EST - LEFT FOOT F/U - STILL ACHES & STAYS COLD - Patient would have been in sooner, but she has been dealing with a brain aneurysm, she just had brain surgery in January    42 y.o. female presents with the above complaint. History confirmed with patient.  Her skin is doing much better she still has burning shooting pain and aching over the top of the left foot and it swells  Objective:  Physical Exam: warm, good capillary refill, no trophic changes or ulcerative lesions, normal DP and PT pulses, normal sensory exam, and diffuse midfoot pain and edema on the left area over the TN joint, mild in nature, good range of motion of subtalar and midfoot joints   Radiographs: Multiple views x-ray of the left foot: Possible joint space narrowing of lateral portion of intermediate lateral navicular cuneiform joint Assessment:   1. Capsulitis of foot, left   2. Other mononeuropathy   3. Arthritis of left foot      Plan:  Patient was evaluated and treated and all questions answered.  Hindfoot pain continues to worsen.  She has tried over-the-counter and topical anti-inflammatory such as diclofenac gel and prednisone was helpful only for the time that she was on it and the pain has returned.  I recommended reevaluating her with lab work and uric acid, ESR, rheumatoid factor B12 and folate were ordered.  I also recommended MRI to evaluate the severity of the arthritis which could be inflammatory in nature.  I will see her back after these are completed to reevaluate further   Return for after MRI to review, after lab work to review.

## 2022-05-07 NOTE — Telephone Encounter (Signed)
Patient advised.

## 2022-05-07 NOTE — Patient Instructions (Signed)
Call [facility] to schedule your MRI at the below locations.  Please allow at least 1 business day after your visit to process the referral.  It may take longer depending on approval from insurance.  Please let me know if you have issues or problems scheduling the MRI   [facility] [phone] [address] [place], [place] [address]  [facility] [phone] [address] [place], [place] [address]  

## 2022-05-10 ENCOUNTER — Encounter: Payer: Self-pay | Admitting: Speech Pathology

## 2022-05-10 ENCOUNTER — Ambulatory Visit: Payer: Medicaid Other | Attending: Family Medicine | Admitting: Speech Pathology

## 2022-05-10 DIAGNOSIS — M25561 Pain in right knee: Secondary | ICD-10-CM | POA: Insufficient documentation

## 2022-05-10 DIAGNOSIS — R479 Unspecified speech disturbances: Secondary | ICD-10-CM | POA: Insufficient documentation

## 2022-05-10 DIAGNOSIS — R471 Dysarthria and anarthria: Secondary | ICD-10-CM | POA: Diagnosis present

## 2022-05-10 DIAGNOSIS — R41841 Cognitive communication deficit: Secondary | ICD-10-CM | POA: Diagnosis not present

## 2022-05-10 DIAGNOSIS — I671 Cerebral aneurysm, nonruptured: Secondary | ICD-10-CM | POA: Insufficient documentation

## 2022-05-10 DIAGNOSIS — R4701 Aphasia: Secondary | ICD-10-CM | POA: Insufficient documentation

## 2022-05-10 LAB — FOLATE: Folate: 5.5 ng/mL (ref >3.0)

## 2022-05-10 LAB — RHEUMATOID FACTOR: Rheumatoid fact SerPl-aCnc: <10 IU/mL (ref <14.0)

## 2022-05-10 LAB — URIC ACID: Uric Acid: 5.2 mg/dL (ref 2.6–6.2)

## 2022-05-10 LAB — VITAMIN B12: Vitamin B-12: 458 pg/mL (ref 232–1245)

## 2022-05-10 LAB — SEDIMENTATION RATE: Sed Rate: 15 mm/hr (ref 0–32)

## 2022-05-10 NOTE — Therapy (Signed)
OUTPATIENT SPEECH LANGUAGE PATHOLOGY APHASIA EVALUATION   Patient Name: Christine Gordon MRN: [id number] DOB:12-28-1980, 42 y.o., female Today's Date: 05/10/2022  PCP: Christine Essex, MD REFERRING PROVIDER: Lenoria Chime, MD  END OF SESSION:  End of Session - 05/10/22 1013     Visit Number 1    Number of Visits 13   for scheduling   Date for SLP Re-Evaluation 07/05/22    Authorization Type Medicaid    SLP Start Time 1015    SLP Stop Time  1108    SLP Time Calculation (min) 53 min    Activity Tolerance Patient tolerated treatment well             Past Medical History:  Diagnosis Date   Abdominal bloating 12/22/2020   Bleeding nose 12/22/2020   Contraception management 10/09/2017   Depo-provera   COVID-19 12/22/2020   COVID infection June 2022.    H/O cesarean section    x3 perpatient   Healthcare maintenance 12/22/2020   Hypertension    Knee pain 04/04/2021   Microscopic hematuria 05/24/2020   Formatting of this note might be different from the original. Urinalysis 03/22 and 04/22. No urine infection Referred to Urology 05/22   Pain in both feet 10/31/2021   Routine screening for STI (sexually transmitted infection) 10/09/2021   Viral illness 11/21/2021   Vitamin D deficiency 07/21/2020   Past Surgical History:  Procedure Laterality Date   CESAREAN SECTION N/A    x3 per patient   Patient Active Problem List   Diagnosis Date Noted   Stroke ([place]) 02/06/2022   Partially empty sella ([place]) 01/11/2022   MDD (major depressive disorder), recurrent, severe, with psychosis ([place]) 07/18/2021   Acute bilateral low back pain without sciatica 06/14/2021   Depressed mood 05/22/2021   Cervical spondylosis 04/04/2021   Chronic myofascial pain 04/04/2021   Chronic complaint of left-sided weakness 01/15/2021   Smoking    Hypertension 12/22/2020   Anxiety about dying 12/22/2020   Class 2 obesity due to excess calories with body mass index (BMI) of 36.0 to 36.9 in adult     Obstructive sleep apnea 12/21/2020   Fibromuscular dysplasia ([place]) 09/14/2020   Mixed hyperlipidemia 07/21/2020   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 04/09/2019   History of prediabetes 04/09/2019   Migraine without aura and with status migrainosus, not intractable 04/09/2019   Mild intermittent asthma without complication [id number]    ONSET DATE: Referral 04-28-22   REFERRING DIAG:  R47.9 (ICD-10-CM) - Difficulty with speech  I67.1 (ICD-10-CM) - Cerebral aneurysm without rupture    THERAPY DIAG:  Cognitive communication deficit - Plan: SLP plan of care cert/re-cert  Dysarthria and anarthria - Plan: SLP plan of care cert/re-cert  Aphasia - Plan: SLP plan of care cert/re-cert  Rationale for Evaluation and Treatment: Rehabilitation  SUBJECTIVE:   SUBJECTIVE STATEMENT: "I'm trying my best" Pt accompanied by: self  PERTINENT HISTORY: Christine Gordon is a 42 y.o. female with history of stroke, migraine, right MCA bifurcation cerebral aneurysm status post clipping, fibromuscular dysplasia presenting to the ED for headache and eye pain.   PAIN:  Are you having pain? Yes: NPRS scale: 7/10 Pain location: head, neck, lower back   FALLS: Has patient fallen in last 6 months?  No  LIVING ENVIRONMENT: Lives with: lives with their family (son and dog) Lives in: House/apartment  PLOF:  Level of assistance: Independent with ADLs, Independent with IADLs Employment: Other: Appealing for disability, hasn't worked since November 2023  [name]: "I want  to be able to remember" "Get back to doing everything"  OBJECTIVE:   DIAGNOSTIC FINDINGS: CT Angio Head 04-17-22: IMPRESSION: 1. No acute intracranial pathology. 2. Status post right pterional craniotomy for aneurysm clipping. No definite residual or recurrent aneurysm. 3. Patent intracranial vasculature.  COGNITION: Overall cognitive status: Impaired Areas of impairment:  Attention: Impaired: Focused, Sustained,  Selective, Alternating, Divided Memory: Impaired: Working Chief Executive Officer function: Impaired: Problem solving, Organization, Planning, Error awareness, and Slow processing Functional deficits: Pt reported that she is currently recovering from neurosurgerical intervention. She relies on her son to assist with managing medications and appointments. She uses autopay for bills. She does not currently drive and her neighbor has been going shopping for her. Endorsed that she no longer participates in activities she used to enjoy, such as cooking. She is experiencing negative QoL in multiple domains d/t cognitive deficits.   AUDITORY COMPREHENSION: Overall auditory comprehension: Impaired: moderately complex YES/NO questions: Appears intact Following directions: Impaired: moderately complex Conversation: Moderately Complex Interfering components: attention, processing speed, and working memory Effective technique: extra processing time, pausing, and repetition/stressing words  READING COMPREHENSION: Did not assess  EXPRESSION: verbal  VERBAL EXPRESSION: Level of generative/spontaneous verbalization: conversation Pragmatics: Appears intact Comments: Aphasia. Unable to fully assess d/t time constraints. Occasional anomia present and observed during conversation and across assessment tasks Interfering components: attention Effective technique: semantic cues Comment: Pt reports frequent moments of word-finding difficulty   MOTOR SPEECH: Overall motor speech: impaired Level of impairment:  Per pt report, speech occasionally slurred. Was not evidenced during today's evaluation Respiration:  WFL Phonation:  Hoarse with habitual low pitch   (stimulable to improve vocal clarity with model)  Resonance: WFL Articulation: Appears intact Intelligibility: Intelligible Motor planning: Appears intact Disfluency: Occasional stutter-like disfluencies  STANDARDIZED ASSESSMENTS: MoCA-  17/30 (WFL > 26)   Evidenced deficits in attention, working memory, verbal expression, delayed recall, executive function.   PATIENT REPORTED OUTCOME MEASURES (PROM): Deferred d/t to time constraint, given to pt to complete at home    TODAY'S TREATMENT: 05-10-22: Pt lives at home with her son who is 86. She has lived in [place] for a year and her family lives 3 hours away. She reported that she lacks support. Education provided on evaluation results and SLP's recommendations. Pt verbalizes agreement with POC, all questions answered to satisfaction.     PATIENT EDUCATION: Education details: See above Person educated: Patient Education method: Explanation Education comprehension: verbalized understanding and needs further education   GOALS: Goals reviewed with patient? Yes  SHORT TERM GOALS: Target date: 06-07-22  Pt will teach back attention and memory strategies/compensations given mod-A.  Baseline: Not present Goal status: INITIAL  2.  Pt will successfully demonstrate anomia compensation during structured tasks in 80% of opportunities given mod-A.  Baseline: Not present Goal status: INITIAL  3.  Pt will demonstrate clear voice and dysarthria strategies PRN during 5-minute structured conversation given mod-A. Baseline: Not present Goal status: INITIAL  4.  Pt will identify appropriate external cognitive aid to support improved participation in home-based tasks.  Baseline: Not present Goal status: INITIAL  LONG TERM GOALS: Target date: 07-05-22  Pt will report implementing attention and memory strategies to aid in home-based tasks across 1 week period.  Baseline: Not present Goal status: INITIAL  2.  Pt will successfully demonstrate anomia compensation during structured tasks in 80% of opportunities given min-A.  Baseline: Not present Goal status: INITIAL  3.  Pt will demonstrate clear voice and dysarthria strategies PRN during  5-minute structured conversation given  min-A. Baseline: Not present Goal status: INITIAL  4.  Pt will report use of 2 external cognitive aids to support improved participation in home-based tasks.  Baseline: Not present Goal status: INITIAL  5.  Pt will report subjective perception of improvement via PROM by d/c.  Baseline: Provided pt with PROM. To be returned by initial session  Goal status: INITIAL  ASSESSMENT:  CLINICAL IMPRESSION: Patient is a 42 y.o. female who was seen today for cognitive-linguistic eval s/p neurosurgery. Pt presents with impairments in language, motor speech, and cognition. Pt's verbal expression is primarily characterized by usual anomia, decreased utterance length, and decreased discourse coherence/cohesion. Usual vague language results in difficulty relaying thoughts and ideas. Highly dependent on SLP questioning to A in case history and determination of functional deficits. No anomia compensations evidenced. Impaired auditory comprehension with pt requiring numerous repetitions to aid in understanding of information. Likely complicated by slow processing. Pt with deficits in memory, attention, and executive function. Pt requiring A from son to manage schedule, medications, meal planning/cooking, finances. Pt reports decreased initiation and fatigue also negatively impacting participation in home based tasks. Pt reporting occasional slurred speech, not evidenced during today's evaluation. Motor speech notable for impaired phonation (usual hoarseness) which pt reports is new since surgery. Stimulibility trials for "clear speech" with pt demonstrating improved vocal clarity with mod-A from SLP. Skilled ST is indicated to address language, motor speech, and cognition to improve QoL, increase participation in home based tasks and iADLs, and maximize independence.    OBJECTIVE IMPAIRMENTS: include attention, memory, executive functioning, receptive language, aphasia, and voice disorder. These impairments are  limiting patient from managing medications, managing appointments, managing finances, household responsibilities, ADLs/IADLs, effectively communicating at home and in community, and participating in preferred activities . Factors affecting potential to achieve goals and functional outcome are family/community support. Patient will benefit from skilled SLP services to address above impairments and improve overall function.  REHAB POTENTIAL: Good  PLAN:  SLP FREQUENCY: 2x/week  SLP DURATION: 8 weeks  PLANNED INTERVENTIONS: Language facilitation, Cognitive reorganization, Internal/external aids, Functional tasks, SLP instruction and feedback, Compensatory strategies, and Patient/family education    Su Monks, CCC-SLP 05/10/2022, 2:43 PM

## 2022-05-10 NOTE — Patient Instructions (Signed)
When you experience moments of difficulty (such as with memory), write them down. That way we know what to work on in therapy

## 2022-05-15 ENCOUNTER — Other Ambulatory Visit (HOSPITAL_COMMUNITY): Payer: Self-pay

## 2022-05-15 ENCOUNTER — Ambulatory Visit (INDEPENDENT_AMBULATORY_CARE_PROVIDER_SITE_OTHER): Payer: Medicaid Other | Admitting: Clinical

## 2022-05-15 DIAGNOSIS — F333 Major depressive disorder, recurrent, severe with psychotic symptoms: Secondary | ICD-10-CM
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
## 2022-05-28 ENCOUNTER — Encounter: Payer: Self-pay | Admitting: Family Medicine

## 2022-05-28 MED ORDER — CHOLECALCIFEROL 125 MCG (5000 UT) PO TABS: 5000.0000 [IU] | ORAL_TABLET | Freq: Every day | ORAL | 0 refills | Status: DC

## 2022-05-30 MED ORDER — VITAMIN D (ERGOCALCIFEROL) 1.25 MG (50000 UNIT) PO CAPS: 50000.0000 [IU] | ORAL_CAPSULE | ORAL | 1 refills | Status: DC

## 2022-05-30 NOTE — Addendum Note (Signed)
Addended by: Renard Hamper on: 05/30/2022 09:44 AM   Modules accepted: Orders

## 2022-05-31 ENCOUNTER — Ambulatory Visit
Admission: RE | Admit: 2022-05-31 | Discharge: 2022-05-31 | Disposition: A | Discharge status: Home / Self Care | Payer: Medicaid Other | Source: Ambulatory Visit | Attending: Podiatry | Admitting: Podiatry

## 2022-05-31 DIAGNOSIS — M778 Other enthesopathies, not elsewhere classified: Secondary | ICD-10-CM

## 2022-05-31 DIAGNOSIS — M19072 Primary osteoarthritis, left ankle and foot: Secondary | ICD-10-CM

## 2022-06-04 ENCOUNTER — Ambulatory Visit: Payer: Medicaid Other | Attending: Family Medicine | Admitting: Speech Pathology

## 2022-06-04 DIAGNOSIS — R471 Dysarthria and anarthria: Secondary | ICD-10-CM

## 2022-06-04 DIAGNOSIS — R4701 Aphasia: Secondary | ICD-10-CM

## 2022-06-04 DIAGNOSIS — R41841 Cognitive communication deficit: Secondary | ICD-10-CM

## 2022-06-04 NOTE — Patient Instructions (Signed)
Set timers for oven on your phone or purchase a kitchen timer  Before you make a phone call:  Write down important information you ned to remember  Names  Reason for calling  Talking points   Reduce distractions during conversations to help your focus   Reduce noise (TV, music)   Close doors  Repeat back information and write it down to make sure you understood correctly and to help you remember it later   Use writing to support your word-finding    Group/Category Action/Verb Function/Purpose Description Location Association (What does it mean to me?)

## 2022-06-04 NOTE — Therapy (Signed)
OUTPATIENT SPEECH LANGUAGE PATHOLOGY APHASIA EVALUATION   Patient Name: Christine Gordon MRN: [id number] DOB:11/09/1980, 42 y.o., female Today's Date: 06/04/2022  PCP: Ezequiel Essex, MD REFERRING PROVIDER: Lenoria Chime, MD  END OF SESSION:  End of Session - 06/04/22 1012     Visit Number 2    Number of Visits 13   for scheduling   Date for SLP Re-Evaluation 07/05/22    Authorization Type Medicaid    SLP Start Time 1015    SLP Stop Time  1100    SLP Time Calculation (min) 45 min    Activity Tolerance Patient tolerated treatment well             Past Medical History:  Diagnosis Date   Abdominal bloating 12/22/2020   Bleeding nose 12/22/2020   Contraception management 10/09/2017   Depo-provera   COVID-19 12/22/2020   COVID infection June 2022.    H/O cesarean section    x3 perpatient   Healthcare maintenance 12/22/2020   Hypertension    Knee pain 04/04/2021   Microscopic hematuria 05/24/2020   Formatting of this note might be different from the original. Urinalysis 03/22 and 04/22. No urine infection Referred to Urology 05/22   Pain in both feet 10/31/2021   Routine screening for STI (sexually transmitted infection) 10/09/2021   Viral illness 11/21/2021   Vitamin D deficiency 07/21/2020   Past Surgical History:  Procedure Laterality Date   CESAREAN SECTION N/A    x3 per patient   Patient Active Problem List   Diagnosis Date Noted   Stroke 02/06/2022   Partially empty sella ([place]) 01/11/2022   MDD (major depressive disorder), recurrent, severe, with psychosis 07/18/2021   Acute bilateral low back pain without sciatica 06/14/2021   Depressed mood 05/22/2021   Cervical spondylosis 04/04/2021   Chronic myofascial pain 04/04/2021   Chronic complaint of left-sided weakness 01/15/2021   Smoking    Hypertension 12/22/2020   Anxiety about dying 12/22/2020   Class 2 obesity due to excess calories with body mass index (BMI) of 36.0 to 36.9 in adult    Obstructive  sleep apnea 12/21/2020   Fibromuscular dysplasia 09/14/2020   Mixed hyperlipidemia 07/21/2020   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 04/09/2019   History of prediabetes 04/09/2019   Migraine without aura and with status migrainosus, not intractable 04/09/2019   Mild intermittent asthma without complication [id number]    ONSET DATE: Referral 04-28-22   REFERRING DIAG:  R47.9 (ICD-10-CM) - Difficulty with speech  I67.1 (ICD-10-CM) - Cerebral aneurysm without rupture    THERAPY DIAG:  Cognitive communication deficit  Dysarthria and anarthria  Aphasia  Rationale for Evaluation and Treatment: Rehabilitation  SUBJECTIVE:   SUBJECTIVE STATEMENT: "My head hurts"  Pt accompanied by: self  PERTINENT HISTORY: Christine Gordon is a 42 y.o. female with history of stroke, migraine, right MCA bifurcation cerebral aneurysm status post clipping, fibromuscular dysplasia presenting to the ED for headache and eye pain.   PAIN:  Are you having pain? No  Complaint of discomfort, head aching  FALLS: Has patient fallen in last 6 months?  No  LIVING ENVIRONMENT: Lives with: lives with their family (son and dog) Lives in: House/apartment  PLOF:  Level of assistance: Independent with ADLs, Independent with IADLs Employment: Other: Appealing for disability, hasn't worked since November 2023  [place]: "I want to be able to remember" "Get back to doing everything"  OBJECTIVE:  TODAY'S TREATMENT:  06-04-22: Pt reported that she has been using a pill box to  organize her meds and has noticed an improvement in managing her meds. Although her son checks behind her,she has not made any mistakes recently with med admin. She continues to use a calendar to follow her appointments. She reported a safety concern with using her stove -- she left it on and accidentally fell asleep. SLP facilitated discussion on safety concerns and how pt could prevents this from occurring again through utilizing  external memory aids, such as setting timers. Pt endorsed mixing up her sons' names over the phone and also forgetting pertinent information when making phone calls. Continued educating pt on memory strategies and compensations, emphasizing importance of writing information down.  Discussed importance of self advocating to reduce cognitive and emotional load. Pt stated when she has moments of anomia she stops communicating. Initiated semantic feature analysis to target aphasia. Given target word, pt generated 5 salient features given mod-A and visual support. Educated pt on importance of practicing her language to improve her aphasia and overall ability to communicate.  Plan to use writing in treatment next session, as pt says that is a strength when she struggles with anomia.  Provided handout on SFA and home practice.   05-10-22: Pt lives at home with her son who is 62. She has lived in [place] for a year and her family lives 3 hours away. She reported that she lacks support. Education provided on evaluation results and SLP's recommendations. Pt verbalizes agreement with POC, all questions answered to satisfaction.   PATIENT EDUCATION: Education details: See above Person educated: Patient Education method: Explanation Education comprehension: verbalized understanding and needs further education   GOALS: Goals reviewed with patient? Yes  SHORT TERM GOALS: Target date: 06-07-22  Pt will teach back attention and memory strategies/compensations given mod-A.  Baseline: Not present Goal status: IN PROGRESS  2.  Pt will successfully demonstrate anomia compensation during structured tasks in 80% of opportunities given mod-A.  Baseline: Not present Goal status: IN PROGRESS  3.  Pt will demonstrate clear voice and dysarthria strategies PRN during 5-minute structured conversation given mod-A. Baseline: Not present Goal status: IN PROGRESS  4.  Pt will identify appropriate external cognitive aid  to support improved participation in home-based tasks.  Baseline: Not present Goal status: IN PROGRESS  LONG TERM GOALS: Target date: 07-05-22  Pt will report implementing attention and memory strategies to aid in home-based tasks across 1 week period.  Baseline: Not present Goal status: IN PROGRESS  2.  Pt will successfully demonstrate anomia compensation during structured tasks in 80% of opportunities given min-A.  Baseline: Not present Goal status: IN PROGRESS  3.  Pt will demonstrate clear voice and dysarthria strategies PRN during 5-minute structured conversation given min-A. Baseline: Not present Goal status: IN PROGRESS  4.  Pt will report use of 2 external cognitive aids to support improved participation in home-based tasks.  Baseline: Not present Goal status: IN PROGRESS  5.  Pt will report subjective perception of improvement via PROM by d/c.  Baseline: Provided pt with PROM. To be returned by initial session  Goal status: IN PROGRESS  ASSESSMENT:  CLINICAL IMPRESSION: Patient is a 42 y.o. female who was seen today for cognitive-linguistic tx s/p neurosurgery. Pt presents with impairments in language, motor speech, and cognition. Pt's verbal expression is primarily characterized by usual anomia, decreased utterance length, and decreased discourse coherence/cohesion. Usual vague language results in difficulty relaying thoughts and ideas. Highly dependent on SLP questioning to A in case history and determination of functional deficits.  No anomia compensations evidenced. Impaired auditory comprehension with pt requiring numerous repetitions to aid in understanding of information. Likely complicated by slow processing. Pt with deficits in memory, attention, and executive function. Pt requiring A from son to manage schedule, medications, meal planning/cooking, finances. Pt reports decreased initiation and fatigue also negatively impacting participation in home based tasks. Pt  reporting occasional slurred speech, not evidenced during today's evaluation. Motor speech notable for impaired phonation (usual hoarseness) which pt reports is new since surgery. Stimulibility trials for "clear speech" with pt demonstrating improved vocal clarity with mod-A from SLP. Skilled ST continues to be indicated to address language, motor speech, and cognition to improve QoL, increase participation in home based tasks and iADLs, and maximize independence.   OBJECTIVE IMPAIRMENTS: include attention, memory, executive functioning, receptive language, aphasia, and voice disorder. These impairments are limiting patient from managing medications, managing appointments, managing finances, household responsibilities, ADLs/IADLs, effectively communicating at home and in community, and participating in preferred activities . Factors affecting potential to achieve goals and functional outcome are family/community support. Patient will benefit from skilled SLP services to address above impairments and improve overall function.  REHAB POTENTIAL: Good  PLAN:  SLP FREQUENCY: 2x/week  SLP DURATION: 8 weeks  PLANNED INTERVENTIONS: Language facilitation, Cognitive reorganization, Internal/external aids, Functional tasks, SLP instruction and feedback, Compensatory strategies, and Patient/family education    Leroy Libman, Student-SLP 06/04/2022, 10:13 AM

## 2022-06-05 ENCOUNTER — Ambulatory Visit (INDEPENDENT_AMBULATORY_CARE_PROVIDER_SITE_OTHER): Payer: Medicaid Other | Admitting: Clinical

## 2022-06-05 ENCOUNTER — Encounter: Payer: Self-pay | Admitting: Podiatry

## 2022-06-05 DIAGNOSIS — F333 Major depressive disorder, recurrent, severe with psychotic symptoms: Secondary | ICD-10-CM

## 2022-06-05 NOTE — Progress Notes (Signed)
THERAPIST PROGRESS NOTE Virtual Visit via Video Note  I connected with Christine Gordon on 06/05/22 at  2:00 PM EDT by a video enabled telemedicine application and verified that I am speaking with the correct person using two identifiers.  Location: Patient: home Provider: office   I discussed the limitations of evaluation and management by telemedicine and the availability of in person appointments. The patient expressed understanding and agreed to proceed.   Follow Up Instructions: I discussed the assessment and treatment plan with the patient. The patient was provided an opportunity to ask questions and all were answered. The patient agreed with the plan and demonstrated an understanding of the instructions.   The patient was advised to call back or seek an in-person evaluation if the symptoms worsen or if the condition fails to improve as anticipated.    Session Time: 30 minutes  Participation Level: Active  Behavioral Response: CasualAlertIrritable  Type of Therapy: Individual Therapy  Treatment Goals addressed: Client will identify cognitive patterns and beliefs that support depression 1 time per session  ProgressTowards Goals: Progressing  Interventions: CBT and Supportive  Summary:  Christine Gordon is a 41 y.o. female who presents for the scheduled appointment oriented x 5, appropriately dressed, and friendly.  Client denied hallucinations and delusions. Client reported on today she is happy to be back home after being back in her hometown staying with her daughter.  Client reported when she went back that was her first time seeing her mother in person since she had the procedure.  Client reported her mother was very cold towards her and made comments calling her "fat" and "you look different".  Client reported her mother did not give her any kind of warm embrace.  Client reported it was likewise with her other siblings and family.  Client reported she felt very anxious and  paranoid there.  Client reported her interaction with her family made her feel completely unwanted.  Client reported she is not sure why her family has cheated her that way since childhood but she has heard some unpleasant stories that were obviously outside of her control due to being a child.  Client reported she is tired of trying to get her mother to except it left her because it is holding her back from moving forward.   Client reported she wants to work on closing that chapter and being okay with not interacting with those particular family members.  Client reported she does not feel she would miss out of anything as they have not called to check on her at all or may remark about how she has been doing since having her major surgery.  Client reported she does not like to think and talk about it because it causes her to feel angry all over again which causes the right side of her head to tense where she has incisions from her surgery.  Client reported she is awaiting to hear back from disability.  Client reported just as a marker of progress she went to her old job of [employer] to see if she can remember anything still about how to do her job.  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  Client reported she feels very frustrated that she is not able to do what she once was able to do. Evidence of progress towards goal: Client reported she has 1  positive behavioral activation activity of reading her daily devotional to help redirect negative thoughts.   Suicidal/Homicidal: Nowithout intent/plan  Therapist Response:  Therapist began the appointment asking the client how she has been doing since last seen. Therapist used CBT to engage using active listening and positive emotional support towards her thoughts and feelings. Therapist used CBT to ask client open-ended  questions about her interactions with her family and had continues to negatively impact the use of herself and her overall emotional wellbeing. Therapist used CBT to collaborate with client to discuss the pros and cons of her brainstormed and boundaries that she would like to reinforce to help her move forward. Therapist used CBT to discuss utilizing any positive stimuli to help redirect reoccurring negative thoughts that are hard for her to distract from home. Therapist used CBT ask the client to identify her progress with frequency of use with coping skills with continued practice in her daily activity.    Therapist assigned client homework to practice positive behavioral activation by reading her devotional books to help improve her mood. Client was scheduled for next appointment.   Plan: Return again in 3 weeks.  Diagnosis: MDD, recurrent, sever with psychosis  Collaboration of Care: Patient refused AEB none requested by the client.  Patient/Guardian was advised Release of Information must be obtained prior to any record release in order to collaborate their care with an outside provider. Patient/Guardian was advised if they have not already done so to contact the registration department to sign all necessary forms in order for [place] to release information regarding their care.   Consent: Patient/Guardian gives verbal consent for treatment and assignment of benefits for services provided during this visit. Patient/Guardian expressed understanding and agreed to proceed.   [place], LCSW 06/05/2022

## 2022-06-11 ENCOUNTER — Ambulatory Visit: Payer: Medicaid Other | Admitting: Speech Pathology

## 2022-06-14 ENCOUNTER — Ambulatory Visit: Payer: Medicaid Other | Admitting: Speech Pathology

## 2022-06-14 ENCOUNTER — Telehealth (INDEPENDENT_AMBULATORY_CARE_PROVIDER_SITE_OTHER): Payer: Medicaid Other | Admitting: Psychiatry

## 2022-06-14 DIAGNOSIS — F333 Major depressive disorder, recurrent, severe with psychotic symptoms: Secondary | ICD-10-CM

## 2022-06-14 MED ORDER — QUETIAPINE FUMARATE 100 MG PO TABS: 100.0000 mg | ORAL_TABLET | Freq: Every day | ORAL | 2 refills | Status: DC

## 2022-06-14 MED ORDER — DULOXETINE HCL 30 MG PO CPEP: 30.0000 mg | ORAL_CAPSULE | Freq: Two times a day (BID) | ORAL | 2 refills | Status: DC

## 2022-06-14 NOTE — Progress Notes (Cosign Needed Addendum)
BH MD/PA/NP OP Progress Note  06/14/2022 5:10 PM Christine Gordon  MRN:  [id number] Virtual Visit via Video Note  I connected with Christine Gordon on 06/14/22 at 11:00 AM EDT by a video enabled telemedicine application and verified that I am speaking with the correct person using two identifiers.  Location: Patient: Home Provider: Clinic   I discussed the limitations of evaluation and management by telemedicine and the availability of in person appointments. The patient expressed understanding and agreed to proceed.  I discussed the assessment and treatment plan with the patient. The patient was provided an opportunity to ask questions and all were answered. The patient agreed with the plan and demonstrated an understanding of the instructions.   The patient was advised to call back or seek an in-person evaluation if the symptoms worsen or if the condition fails to improve as anticipated.  I provided 30 minutes of non-face-to-face time during this encounter.   Christine Ro, MD   Chief Complaint:  Chief Complaint  Patient presents with   Follow-up   Subjective: Patient is a 42 y.o.  female with past psychiatric history of MDD with psychosis and medical history of brain aneurysm s/p surgical resection at [facility] on January 26, stroke, HTN, HLD, back pain due to sciatica, cervical spondylosis, chronic myofascial pain, prediabetes, asthma and migraine presented to [facility] outpatient clinic virtually for medication management for depression.  Pt reports that her mood has been "up-and-down".  She feels irritable and angry.  She reports that she is not able to sleep well and wakes up multiple times due to anxiety. She reports variable appetite.  She reports that she has a hard time remembering things and has slurry speech post surgery.  She is getting speech therapy.  She reports that her scalp skin is numb and she feels frustrated about forgetting things and not able to speak clearly.  She is always  worried that something bad is going to happen to her in sleep and she won't wake up.  Her doctor started her on nortriptyline 25 mg nightly for sleep but she was having palpitations so she stopped.   Currently, she denies any active suicidal ideations, and homicidal ideations.  She reports having fleeting passive SI.  Denies plan or intent.  Contracts for safety at this time.  Safety planning done with patient.  Patient agrees to call [phone] to get help if her suicidal thoughts gets worse.  She has chronic vague thoughts about hurting people from her past but denies any plan or intent.  Feels safe at home.  She contracts for safety at this time and will go to urgent care if her chronic homicidal thoughts get worse. She has chronic visual hallucinations of seeing little boy since childhood.  She does not get stressed by this.  She denies AH.  She denies illicit drugs or alcohol use.  She does not smoke cigarettes.  Discussed increasing Seroquel to help with sleep, irritability and depression.  She agrees with the plan.  Past Psychiatric History: History of MDD with psychosis. She reports 2 past suicidal attempts when she was 15-34 years old by putting a knife to her throat and trying to overdose.  She reports that she never actually overdosed.  She denies past psychiatric hospitalizations.  She has never been on any psychotropic medications.  She gets therapy at [facility] every 2 weeks.  Past Medical History:  Past Medical History:  Diagnosis Date   Abdominal bloating 12/22/2020   Bleeding nose  12/22/2020   Contraception management 10/09/2017   Depo-provera   COVID-19 12/22/2020   COVID infection June 2022.    H/O cesarean section    x3 perpatient   Healthcare maintenance 12/22/2020   Hypertension    Knee pain 04/04/2021   Microscopic hematuria 05/24/2020   Formatting of this note might be different from the original. Urinalysis 03/22 and 04/22. No urine infection Referred to Urology 05/22   Pain in  both feet 10/31/2021   Routine screening for STI (sexually transmitted infection) 10/09/2021   Viral illness 11/21/2021   Vitamin D deficiency 07/21/2020    Past Surgical History:  Procedure Laterality Date   CESAREAN SECTION N/A    x3 per patient    Family Psychiatric History: None  Family History:  Family History  Problem Relation Age of Onset   Healthy Mother    Colon cancer Sister    High Cholesterol Maternal Aunt    High blood pressure Maternal Aunt    Diabetes Paternal Aunt    Diabetes Paternal Uncle    Diabetes Paternal Grandmother    Esophageal cancer Neg Hx    Pancreatic cancer Neg Hx    Stomach cancer Neg Hx     Social History:  Social History   Socioeconomic History   Marital status: Single    Spouse name: Not on file   Number of children: 4   Years of education: Not on file   Highest education level: Not on file  Occupational History   Not on file  Tobacco Use   Smoking status: Former    Types: Cigarettes    Quit date: 2019    Years since quitting: 5.2   Smokeless tobacco: Never  Vaping Use   Vaping Use: Never used  Substance and Sexual Activity   Alcohol use: Never   Drug use: Never   Sexual activity: Not on file  Other Topics Concern   Not on file  Social History Narrative   Christine Gordon has 4 children: daughter (b ~52) and 3 sons (b.~2021),(b.2003), and (b. ~2005).   2 oldest sons live in [place] and her youngest son lives with her   Right handed   No caffeine   Social Determinants of Health   Financial Resource Strain: Not on file  Food Insecurity: Not on file  Transportation Needs: Not on file  Physical Activity: Not on file  Stress: Stress Concern Present (08/15/2021)   [employer] of Occupational Health - Occupational Stress Questionnaire    Feeling of Stress : To some extent  Social Connections: Not on file    Allergies:  Allergies  Allergen Reactions   Nsaids Other (See Comments)    NSAIDs contraindicated due to  cerebral aneurysm and fibromuscular dysplasia.   Triptans Other (See Comments)    Triptans are contraindicated due to cerebral aneurysm and fibromuscular dysplasia.     Metabolic Disorder Labs: No results found for: "HGBA1C", "MPG" No results found for: "PROLACTIN" No results found for: "CHOL", "TRIG", "HDL", "CHOLHDL", "VLDL", "LDLCALC" Lab Results  Component Value Date   TSH 2.15 04/23/2021    Therapeutic Level Labs: No results found for: "LITHIUM" No results found for: "VALPROATE" No results found for: "CBMZ"  Current Medications: Current Outpatient Medications  Medication Sig Dispense Refill   acetaminophen (TYLENOL 8 HOUR) 650 MG CR tablet Take 1 tablet (650 mg total) by mouth every 6 (six) hours as needed for pain or fever. 30 tablet 1   albuterol (VENTOLIN HFA) 108 (90 Base) MCG/ACT  inhaler Inhale 2 puffs into the lungs every 6 (six) hours as needed. 1 each 3   Alpha-D-Galactosidase (GAS-X PREVENTION) CAPS Take 80 capsules by mouth as directed. 180 capsule 1   aspirin EC 81 MG tablet Take 81 mg by mouth daily. Swallow whole.     benzonatate (TESSALON PERLES) 100 MG capsule Take 1 capsule (100 mg total) by mouth 3 (three) times daily as needed for cough. (Patient not taking: Reported on 04/15/2022) 20 capsule 0   butalbital-acetaminophen-caffeine (FIORICET) 50-325-40 MG tablet Take 1-2 tablets by mouth every 6 (six) hours as needed for headache. 20 tablet 0   Capsaicin-Menthol-Methyl Sal 0.001-10-20 % LOTN Apply to knee daily for pain relief. (Patient not taking: Reported on 04/15/2022) 113.4 g 3   [employer] SALINE NASAL SPRAY 0.65 % nasal spray SPRAY 1 SPRAY INTO EACH NOSTRIL AS NEEDED FOR CONGESTION 60 mL 11   cyclobenzaprine (FLEXERIL) 5 MG tablet Take 1 tablet (5 mg total) by mouth 3 (three) times daily as needed. 30 tablet 0   docusate sodium (COLACE) 250 MG capsule Take 1 capsule (250 mg total) by mouth daily. 30 capsule 3   DULoxetine (CYMBALTA) 30 MG capsule Take 1 capsule (30  mg total) by mouth 2 (two) times daily. 60 capsule 2   famotidine (PEPCID) 20 MG tablet Take 1 tablet (20 mg total) by mouth at bedtime. 90 tablet 3   Galcanezumab-gnlm (EMGALITY) 120 MG/ML SOAJ Inject 120 mg into the skin every 28 (twenty-eight) days. 1.12 mL 5   guaiFENesin (MUCINEX) 600 MG 12 hr tablet Take 1 tablet (600 mg total) by mouth 2 (two) times daily. (Patient not taking: Reported on 04/15/2022) 14 tablet 0   ketoconazole (NIZORAL) 2 % cream Apply 1 Application topically daily. 60 g 2   levETIRAcetam (KEPPRA) 1000 MG tablet Take by mouth.     lidocaine (XYLOCAINE) 5 % ointment APPLY TOPICALLY 1 APPLICATION AS NEEDED 35.44 g 3   linaclotide (LINZESS) 290 MCG CAPS capsule Take 1 capsule (290 mcg total) by mouth daily before breakfast. 30 capsule 2   olmesartan-hydrochlorothiazide (BENICAR HCT) 20-12.5 MG tablet TAKE 1 TABLET BY MOUTH EVERY DAY 90 tablet 3   omeprazole (PRILOSEC) 40 MG capsule Take 1 capsule (40 mg total) by mouth daily. Take one capsule 30 minutes before breakfast daily. 90 capsule 3   ondansetron (ZOFRAN-ODT) 4 MG disintegrating tablet Take 4 mg by mouth every 8 (eight) hours as needed.     oxyCODONE (OXY IR/ROXICODONE) 5 MG immediate release tablet Take 5 mg by mouth every 6 (six) hours as needed.     prochlorperazine (COMPAZINE) 10 MG tablet Take 1 tablet (10 mg total) by mouth 2 (two) times daily as needed (Headache). 20 tablet 0   QUEtiapine (SEROQUEL) 100 MG tablet Take 1 tablet (100 mg total) by mouth at bedtime. 30 tablet 2   rosuvastatin (CRESTOR) 10 MG tablet Take 1 tablet (10 mg total) by mouth daily. Please come to clinic lab for cholesterol check. 30 tablet 2   senna (SENOKOT) 8.6 MG TABS tablet Take 1 tablet (8.6 mg total) by mouth daily. 120 tablet 2   Ubrogepant (UBRELVY) 100 MG TABS Take 1 tablet (100 mg total) by mouth as needed. May repeat after 2 hours.  Maximum 2 tablets in 24 hours. 10 tablet 5   verapamil (CALAN-SR) 120 MG CR tablet Take 1 tablet (120  mg total) by mouth at bedtime. 90 tablet 3   Vitamin D, Ergocalciferol, (DRISDOL) 1.25 MG (50000 UNIT) CAPS capsule  Take 1 capsule (50,000 Units total) by mouth every 7 (seven) days. 12 capsule 1   zonisamide (ZONEGRAN) 100 MG capsule Take 1 capsule (100 mg total) by mouth daily. 30 capsule 5   No current facility-administered medications for this visit.     Musculoskeletal: Strength & Muscle Tone: Not evaluated due to virtual visit. Gait-not able to evaluate due to virtual visit. Patient leans: N/A  Psychiatric Specialty Exam: Review of Systems  There were no vitals taken for this visit.There is no height or weight on file to calculate BMI.  General Appearance: Casual  Eye Contact:  Fair  Speech:  Clear and Coherent and Normal Rate  Volume:  Normal  Mood:  Anxious and Depressed  Affect:  Congruent and Depressed  Thought Process:  Coherent  Orientation:  Full (Time, Place, and Person)  Thought Content: Hallucinations: Visual (chronic),Denies AH  Suicidal Thoughts:  No fleeting passive SI.  Denies plan or intent.  Contracts for safety at this time  Homicidal Thoughts:  Yes.  without intent/plan chronic vague thoughts of  hurting people from her past but denies any plan or intent.  Contracts for safety at this time.  Memory:  Immediate;   Good Recent;   Fair Remote;   Fair  Judgement:  Fair  Insight:  Fair  Psychomotor Activity:  Normal  Concentration:  Concentration: Fair and Attention Span: Fair  Recall:  [employer] of Knowledge: Fair  Language: Good  Akathisia:  No  Handed:  Right  AIMS (if indicated): not done  Assets:  Communication Skills Desire for Improvement Housing Resilience Others:  employed  ADL's:  Intact  Cognition: WNL  Sleep:  Fair   Screenings: GAD-7    Flowsheet Row Office Visit from 06/13/2021 in [facility]  Total GAD-7 Score 13      PHQ2-9    Flowsheet Row Counselor from 03/19/2022 in [facility] Counselor from 02/07/2022 in [facility] Office Visit from 01/10/2022 in [facility] Office Visit from 10/09/2021 in [facility] Counselor from 07/18/2021 in [place]  PHQ-2 Total Score PHQ-9 Total Score Flowsheet Row ED from 04/26/2022 in [facility] Emergency Department at [facility] ED from 04/17/2022 in [facility] Emergency Department at [facility] Counselor from 02/07/2022 in [facility]  C-SSRS RISK CATEGORY No Risk No Risk Moderate Risk        Assessment and Plan: Patient is a 42 y.o.  female with past psychiatric history of MDD with psychosis and medical history of brain aneurysm s/p surgical resection at [facility] on January 26, stroke, HTN, HLD, back pain due to sciatica, cervical spondylosis, chronic myofascial pain, prediabetes, asthma and migraine presented to [facility] outpatient clinic virtually for medication management for depression. Patient has been reporting poor sleep, irritability, depressed mood and anxiety.  She is amenable to increasing Seroquel to help with mood stability, anxiety, irritability and poor sleep. Labs reviewed-EKG QTc 442  MDD with psychosis GAD -Continue Cymbalta 30 mg twice daily. 30-day prescription with 2 refills sent to her pharmacy. -Increase Seroquel to 100 mg nightly for mood stability.(QTc 442 ).30-day prescription with 2 refills sent to her pharmacy.  Follow-up In 6 weeks   Collaboration of Care: Collaboration of Care: None  Patient/Guardian was advised Release of Information must be  obtained prior to any record release in order to collaborate their care with an outside provider. Patient/Guardian was advised if they have not already done so to contact the registration department to sign all necessary forms in order for [place] to release information regarding their care.    Consent: Patient/Guardian gives verbal consent for treatment and assignment of benefits for services provided during this visit. Patient/Guardian expressed understanding and agreed to proceed.    Christine Ro, MD 06/14/2022, 5:10 PM

## 2022-06-18 ENCOUNTER — Ambulatory Visit: Payer: Medicaid Other | Admitting: Speech Pathology

## 2022-06-20 ENCOUNTER — Encounter: Payer: Medicaid Other | Admitting: Speech Pathology

## 2022-06-21 ENCOUNTER — Encounter (HOSPITAL_COMMUNITY): Payer: Self-pay | Admitting: Psychiatry

## 2022-06-21 NOTE — Therapy (Unsigned)
OUTPATIENT SPEECH LANGUAGE PATHOLOGY TREATMENT NOTE   Patient Name: Christine Gordon MRN: [id number] DOB:01-11-1981, 42 y.o., female Today's Date: 06/25/2022  PCP: Fayette Pho, MD REFERRING PROVIDER: Billey Co, MD  END OF SESSION:  End of Session - 06/25/22 1006     Visit Number 3    Number of Visits 13   for scheduling   Date for SLP Re-Evaluation 07/05/22    Authorization Type Medicaid    SLP Start Time 1015    SLP Stop Time  1100    SLP Time Calculation (min) 45 min    Activity Tolerance Patient tolerated treatment well              Past Medical History:  Diagnosis Date   Abdominal bloating 12/22/2020   Bleeding nose 12/22/2020   Contraception management 10/09/2017   Depo-provera   COVID-19 12/22/2020   COVID infection June 2022.    H/O cesarean section    x3 perpatient   Healthcare maintenance 12/22/2020   Hypertension    Knee pain 04/04/2021   Microscopic hematuria 05/24/2020   Formatting of this note might be different from the original. Urinalysis 03/22 and 04/22. No urine infection Referred to Urology 05/22   Pain in both feet 10/31/2021   Routine screening for STI (sexually transmitted infection) 10/09/2021   Viral illness 11/21/2021   Vitamin D deficiency 07/21/2020   Past Surgical History:  Procedure Laterality Date   CESAREAN SECTION N/A    x3 per patient   Patient Active Problem List   Diagnosis Date Noted   Stroke 02/06/2022   Partially empty sella (HCC) 01/11/2022   MDD (major depressive disorder), recurrent, severe, with psychosis 07/18/2021   Acute bilateral low back pain without sciatica 06/14/2021   Depressed mood 05/22/2021   Cervical spondylosis 04/04/2021   Chronic myofascial pain 04/04/2021   Chronic complaint of left-sided weakness 01/15/2021   Smoking    Hypertension 12/22/2020   Anxiety about dying 12/22/2020   Class 2 obesity due to excess calories with body mass index (BMI) of 36.0 to 36.9 in adult    Obstructive  sleep apnea 12/21/2020   Fibromuscular dysplasia 09/14/2020   Mixed hyperlipidemia 07/21/2020   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 04/09/2019   History of prediabetes 04/09/2019   Migraine without aura and with status migrainosus, not intractable 04/09/2019   Mild intermittent asthma without complication 01/20/2014    ONSET DATE: Referral 04-28-22   REFERRING DIAG:  R47.9 (ICD-10-CM) - Difficulty with speech  I67.1 (ICD-10-CM) - Cerebral aneurysm without rupture    THERAPY DIAG:  Cognitive communication deficit  Dysarthria and anarthria  Aphasia  Rationale for Evaluation and Treatment: Rehabilitation  SUBJECTIVE:   SUBJECTIVE STATEMENT: Pt reports has been feeling unmotivated for ST, thus cancelled last 2 week's appointments.   PERTINENT HISTORY: Christine Gordon is a 42 y.o. female with history of stroke, migraine, right MCA bifurcation cerebral aneurysm status post clipping, fibromuscular dysplasia presenting to the ED for headache and eye pain.   PAIN:  Are you having pain? No   FALLS: Has patient fallen in last 6 months?  No  LIVING ENVIRONMENT: Lives with: lives with their family (son and dog) Lives in: House/apartment  PLOF:  Level of assistance: Independent with ADLs, Independent with IADLs Employment: Other: Appealing for disability, hasn't worked since November 2023  PATIENT GOALS: "I want to be able to remember" "Get back to doing everything"  OBJECTIVE:  TODAY'S TREATMENT:  06/25/2022: Ongoing education and training regarding use of external  memory aids to support impaired recall. Given scenario, pt able to verbalize appropriate memory aid in 4/5 opportunities. SLP assists pt in generation of external aid (checklist) for upcoming move. Challenges in identifying ways to recall prospective memory items which occur far in future, e.g. asking doctor information at next visit. SLP educates on and provides direct instruction for use of reminders app on  phone. Pt adds x1 reminder in phone with max-A from SLP. Pt agrees this would be helpful tool if she could become more proficent in it's use. Plan to practice in upcoming session.   06-04-22: Pt reported that she has been using a pill box to organize her meds and has noticed an improvement in managing her meds. Although her son checks behind her,she has not made any mistakes recently with med admin. She continues to use a calendar to follow her appointments. She reported a safety concern with using her stove -- she left it on and accidentally fell asleep. SLP facilitated discussion on safety concerns and how pt could prevents this from occurring again through utilizing external memory aids, such as setting timers. Pt endorsed mixing up her sons' names over the phone and also forgetting pertinent information when making phone calls. Continued educating pt on memory strategies and compensations, emphasizing importance of writing information down.  Discussed importance of self advocating to reduce cognitive and emotional load. Pt stated when she has moments of anomia she stops communicating. Initiated semantic feature analysis to target aphasia. Given target word, pt generated 5 salient features given mod-A and visual support. Educated pt on importance of practicing her language to improve her aphasia and overall ability to communicate.  Plan to use writing in treatment next session, as pt says that is a strength when she struggles with anomia.  Provided handout on SFA and home practice.   05-10-22: Pt lives at home with her son who is 76. She has lived in [place] for a year and her family lives 3 hours away. She reported that she lacks support. Education provided on evaluation results and SLP's recommendations. Pt verbalizes agreement with POC, all questions answered to satisfaction.   PATIENT EDUCATION: Education details: See above Person educated: Patient Education method: Explanation Education  comprehension: verbalized understanding and needs further education   GOALS: Goals reviewed with patient? Yes  SHORT TERM GOALS: Target date: 06-07-22  Pt will teach back attention and memory strategies/compensations given mod-A.  Baseline: Not present Goal status: NOT MET  2.  Pt will successfully demonstrate anomia compensation during structured tasks in 80% of opportunities given mod-A.  Baseline: Not present Goal status: NOT MET  3.  Pt will demonstrate clear voice and dysarthria strategies PRN during 5-minute structured conversation given mod-A. Baseline: Not present; 06/25/22 Goal status: MET  4.  Pt will identify appropriate external cognitive aid to support improved participation in home-based tasks.  Baseline: Not present; 06/04/22 Goal status: MET  LONG TERM GOALS: Target date: 07-05-22  Pt will report implementing attention and memory strategies to aid in home-based tasks across 1 week period.  Baseline: Not present Goal status: IN PROGRESS  2.  Pt will successfully demonstrate anomia compensation during structured tasks in 80% of opportunities given min-A.  Baseline: Not present Goal status: IN PROGRESS  3.  Pt will demonstrate clear voice and dysarthria strategies PRN during 5-minute structured conversation given min-A. Baseline: Not present Goal status: IN PROGRESS  4.  Pt will report use of 2 external cognitive aids to support improved participation in home-based  tasks.  Baseline: Not present Goal status: IN PROGRESS  5.  Pt will report subjective perception of improvement via PROM by d/c.  Baseline: Provided pt with PROM. To be returned by initial session  Goal status: IN PROGRESS  ASSESSMENT:  CLINICAL IMPRESSION: Ongoing impairments in language, motor speech, and cognition. Progress limited d/t limited sessions attended, with pt reporting motivation for attending ST sessions is low. Re-education provided on purpose of therapy and need for participation in  order to see positive effects. Skilled interventions this date targeted use of external memory supports. Skilled ST continues to be indicated to address language, motor speech, and cognition to improve QoL, increase participation in home based tasks and iADLs, and maximize independence.   OBJECTIVE IMPAIRMENTS: include attention, memory, executive functioning, receptive language, aphasia, and voice disorder. These impairments are limiting patient from managing medications, managing appointments, managing finances, household responsibilities, ADLs/IADLs, effectively communicating at home and in community, and participating in preferred activities . Factors affecting potential to achieve goals and functional outcome are family/community support. Patient will benefit from skilled SLP services to address above impairments and improve overall function.  REHAB POTENTIAL: Good  PLAN:  SLP FREQUENCY: 2x/week  SLP DURATION: 8 weeks  PLANNED INTERVENTIONS: Language facilitation, Cognitive reorganization, Internal/external aids, Functional tasks, SLP instruction and feedback, Compensatory strategies, and Patient/family education    Maia Breslow, CCC-SLP 06/25/2022, 11:46 AM

## 2022-06-25 ENCOUNTER — Ambulatory Visit: Payer: Medicaid Other | Admitting: Speech Pathology

## 2022-06-25 DIAGNOSIS — R41841 Cognitive communication deficit: Secondary | ICD-10-CM

## 2022-06-25 DIAGNOSIS — R4701 Aphasia: Secondary | ICD-10-CM

## 2022-06-25 DIAGNOSIS — R471 Dysarthria and anarthria: Secondary | ICD-10-CM

## 2022-06-25 NOTE — Patient Instructions (Addendum)
Moving to-do list   Packing  __ brother and his friends will pack big stuff and load everything up   Is there anything you need to do before they come to make it easier?  __ start to pack up things you don't need   Finding a new place Stay with [name] for awhile then figure out your own situation  Financial  __ Change your address for banking  __ Notify your utilities that you'll be cancelling/closing/putting on hold account  [employer]  Internet  __ Give notice to landlord   Mail  __ change of address form   Auto  __ update license address with DMV   __ update care registration with DMV  __ update car insurance information with new address   Medical Care  __ find a new primary care physician (prior PCP from living in [place] before)   __ new referrals for ST  __ neurology - ask Dr. Everlena Cooper if you need another f/u after your June appointment    Whenever you think I need to remember... that's your cue to WRITE IT DOWN. Grocery list Calendar  Notes app on phone Reminders app    PRACTICE using the reminders app!!

## 2022-06-27 ENCOUNTER — Ambulatory Visit: Payer: Medicaid Other | Admitting: Speech Pathology

## 2022-06-27 DIAGNOSIS — R4701 Aphasia: Secondary | ICD-10-CM

## 2022-06-27 DIAGNOSIS — R41841 Cognitive communication deficit: Secondary | ICD-10-CM | POA: Diagnosis not present

## 2022-06-27 DIAGNOSIS — R471 Dysarthria and anarthria: Secondary | ICD-10-CM

## 2022-06-27 NOTE — Patient Instructions (Signed)
Reminders App   White circle + sign to add new  In [facility] section, write/say note about what you need to remember  If using microphone, after done speaking, click blue microphone to stop recording To add alert Add time Click date, select appropriate date Click time, set appropriate time To make reoccurring -- this is great so you don't have to enter the same reminder in again and again! Click DON'T REPEAT Select how often you want it to remind you  Click back button SAVE at bottom to save your reminder   Once an alert goes off, click the empty purple circle to mark item off as complete

## 2022-06-27 NOTE — Therapy (Signed)
OUTPATIENT SPEECH LANGUAGE PATHOLOGY TREATMENT NOTE   Patient Name: Christine Gordon MRN: [id number] DOB:1980/10/06, 42 y.o., female Today's Date: 06/27/2022  PCP: Fayette Pho, MD REFERRING PROVIDER: Billey Co, MD  END OF SESSION:  End of Session - 06/27/22 1020     Visit Number 4    Number of Visits 13   for scheduling   Date for SLP Re-Evaluation 07/05/22    Authorization Type Medicaid    SLP Start Time 1020    SLP Stop Time  1100    SLP Time Calculation (min) 40 min    Activity Tolerance Patient tolerated treatment well               Past Medical History:  Diagnosis Date   Abdominal bloating 12/22/2020   Bleeding nose 12/22/2020   Contraception management 10/09/2017   Depo-provera   COVID-19 12/22/2020   COVID infection June 2022.    H/O cesarean section    x3 perpatient   Healthcare maintenance 12/22/2020   Hypertension    Knee pain 04/04/2021   Microscopic hematuria 05/24/2020   Formatting of this note might be different from the original. Urinalysis 03/22 and 04/22. No urine infection Referred to Urology 05/22   Pain in both feet 10/31/2021   Routine screening for STI (sexually transmitted infection) 10/09/2021   Viral illness 11/21/2021   Vitamin D deficiency 07/21/2020   Past Surgical History:  Procedure Laterality Date   CESAREAN SECTION N/A    x3 per patient   Patient Active Problem List   Diagnosis Date Noted   Stroke 02/06/2022   Partially empty sella (HCC) 01/11/2022   MDD (major depressive disorder), recurrent, severe, with psychosis 07/18/2021   Acute bilateral low back pain without sciatica 06/14/2021   Depressed mood 05/22/2021   Cervical spondylosis 04/04/2021   Chronic myofascial pain 04/04/2021   Chronic complaint of left-sided weakness 01/15/2021   Smoking    Hypertension 12/22/2020   Anxiety about dying 12/22/2020   Class 2 obesity due to excess calories with body mass index (BMI) of 36.0 to 36.9 in adult     Obstructive sleep apnea 12/21/2020   Fibromuscular dysplasia 09/14/2020   Mixed hyperlipidemia 07/21/2020   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 04/09/2019   History of prediabetes 04/09/2019   Migraine without aura and with status migrainosus, not intractable 04/09/2019   Mild intermittent asthma without complication 01/20/2014    ONSET DATE: Referral 04-28-22   REFERRING DIAG:  R47.9 (ICD-10-CM) - Difficulty with speech  I67.1 (ICD-10-CM) - Cerebral aneurysm without rupture    THERAPY DIAG:  Cognitive communication deficit  Dysarthria and anarthria  Aphasia  Rationale for Evaluation and Treatment: Rehabilitation  SUBJECTIVE:   SUBJECTIVE STATEMENT: Pt reports has been feeling unmotivated for ST, thus cancelled last 2 week's appointments.   PERTINENT HISTORY: Christine Gordon is a 42 y.o. female with history of stroke, migraine, right MCA bifurcation cerebral aneurysm status post clipping, fibromuscular dysplasia presenting to the ED for headache and eye pain.   PAIN:  Are you having pain? 6/10 headache   FALLS: Has patient fallen in last 6 months?  No  LIVING ENVIRONMENT: Lives with: lives with their family (son and dog) Lives in: House/apartment  PLOF:  Level of assistance: Independent with ADLs, Independent with IADLs Employment: Other: Appealing for disability, hasn't worked since November 2023  PATIENT GOALS: "I want to be able to remember" "Get back to doing everything"  OBJECTIVE:  TODAY'S TREATMENT:  06/27/22: Modeled use of reminders app with  education provided on assistive features of location, time, and reoccurring features. Skill practice in using reminders app as external memory aid to support recall of prospective memory tasks. Given written appointment/event to add, pt successful in adding x8 events with use of location and time based alerts and reoccurring feature. Consistent max cueing required to successfully use app. SLP provided written  instructions to aid in home practice.   06/25/2022: Ongoing education and training regarding use of external memory aids to support impaired recall. Given scenario, pt able to verbalize appropriate memory aid in 4/5 opportunities. SLP assists pt in generation of external aid (checklist) for upcoming move. Challenges in identifying ways to recall prospective memory items which occur far in future, e.g. asking doctor information at next visit. SLP educates on and provides direct instruction for use of reminders app on phone. Pt adds x1 reminder in phone with max-A from SLP. Pt agrees this would be helpful tool if she could become more proficent in it's use. Plan to practice in upcoming session.   06-04-22: Pt reported that she has been using a pill box to organize her meds and has noticed an improvement in managing her meds. Although her son checks behind her,she has not made any mistakes recently with med admin. She continues to use a calendar to follow her appointments. She reported a safety concern with using her stove -- she left it on and accidentally fell asleep. SLP facilitated discussion on safety concerns and how pt could prevents this from occurring again through utilizing external memory aids, such as setting timers. Pt endorsed mixing up her sons' names over the phone and also forgetting pertinent information when making phone calls. Continued educating pt on memory strategies and compensations, emphasizing importance of writing information down.  Discussed importance of self advocating to reduce cognitive and emotional load. Pt stated when she has moments of anomia she stops communicating. Initiated semantic feature analysis to target aphasia. Given target word, pt generated 5 salient features given mod-A and visual support. Educated pt on importance of practicing her language to improve her aphasia and overall ability to communicate.  Plan to use writing in treatment next session, as pt says that is a  strength when she struggles with anomia.  Provided handout on SFA and home practice.   05-10-22: Pt lives at home with her son who is 56. She has lived in [place] for a year and her family lives 3 hours away. She reported that she lacks support. Education provided on evaluation results and SLP's recommendations. Pt verbalizes agreement with POC, all questions answered to satisfaction.   PATIENT EDUCATION: Education details: See above Person educated: Patient Education method: Explanation Education comprehension: verbalized understanding and needs further education   GOALS: Goals reviewed with patient? Yes  SHORT TERM GOALS: Target date: 06-07-22  Pt will teach back attention and memory strategies/compensations given mod-A.  Baseline: Not present Goal status: NOT MET  2.  Pt will successfully demonstrate anomia compensation during structured tasks in 80% of opportunities given mod-A.  Baseline: Not present Goal status: NOT MET  3.  Pt will demonstrate clear voice and dysarthria strategies PRN during 5-minute structured conversation given mod-A. Baseline: Not present; 06/25/22 Goal status: MET  4.  Pt will identify appropriate external cognitive aid to support improved participation in home-based tasks.  Baseline: Not present; 06/04/22 Goal status: MET  LONG TERM GOALS: Target date: 07-05-22  Pt will report implementing attention and memory strategies to aid in home-based tasks across 1 week  period.  Baseline: Not present Goal status: IN PROGRESS  2.  Pt will successfully demonstrate anomia compensation during structured tasks in 80% of opportunities given min-A.  Baseline: Not present Goal status: IN PROGRESS  3.  Pt will demonstrate clear voice and dysarthria strategies PRN during 5-minute structured conversation given min-A. Baseline: Not present Goal status: IN PROGRESS  4.  Pt will report use of 2 external cognitive aids to support improved participation in home-based  tasks.  Baseline: Not present Goal status: IN PROGRESS  5.  Pt will report subjective perception of improvement via PROM by d/c.  Baseline: Provided pt with PROM. To be returned by initial session  Goal status: IN PROGRESS  ASSESSMENT:  CLINICAL IMPRESSION: Ongoing impairments in language, motor speech, and cognition. Progress limited d/t limited sessions attended, with pt reporting motivation for attending ST sessions is low. Re-education provided on purpose of therapy and need for participation in order to see positive effects. Skilled interventions this date targeted use of external memory supports. Skilled ST continues to be indicated to address language, motor speech, and cognition to improve QoL, increase participation in home based tasks and iADLs, and maximize independence.   OBJECTIVE IMPAIRMENTS: include attention, memory, executive functioning, receptive language, aphasia, and voice disorder. These impairments are limiting patient from managing medications, managing appointments, managing finances, household responsibilities, ADLs/IADLs, effectively communicating at home and in community, and participating in preferred activities . Factors affecting potential to achieve goals and functional outcome are family/community support. Patient will benefit from skilled SLP services to address above impairments and improve overall function.  REHAB POTENTIAL: Good  PLAN:  SLP FREQUENCY: 2x/week  SLP DURATION: 8 weeks  PLANNED INTERVENTIONS: Language facilitation, Cognitive reorganization, Internal/external aids, Functional tasks, SLP instruction and feedback, Compensatory strategies, and Patient/family education    Maia Breslow, CCC-SLP 06/27/2022, 10:20 AM

## 2022-07-01 ENCOUNTER — Ambulatory Visit (INDEPENDENT_AMBULATORY_CARE_PROVIDER_SITE_OTHER): Payer: Medicaid Other | Admitting: Family Medicine

## 2022-07-01 ENCOUNTER — Encounter: Payer: Self-pay | Admitting: Family Medicine

## 2022-07-01 ENCOUNTER — Other Ambulatory Visit (HOSPITAL_COMMUNITY)
Admission: RE | Admit: 2022-07-01 | Discharge: 2022-07-01 | Disposition: A | Discharge status: Home / Self Care | Payer: Medicaid Other | Source: Ambulatory Visit | Attending: Family Medicine | Admitting: Family Medicine

## 2022-07-01 VITALS — BP 129/96 | HR 90 | Wt 196.4 lb

## 2022-07-01 DIAGNOSIS — E782 Mixed hyperlipidemia: Secondary | ICD-10-CM

## 2022-07-01 DIAGNOSIS — Z113 Encounter for screening for infections with a predominantly sexual mode of transmission: Secondary | ICD-10-CM | POA: Insufficient documentation

## 2022-07-01 DIAGNOSIS — L304 Erythema intertrigo: Secondary | ICD-10-CM | POA: Diagnosis not present

## 2022-07-01 DIAGNOSIS — I773 Arterial fibromuscular dysplasia: Secondary | ICD-10-CM

## 2022-07-01 DIAGNOSIS — I639 Cerebral infarction, unspecified: Secondary | ICD-10-CM | POA: Diagnosis not present

## 2022-07-01 MED ORDER — NYSTATIN 100000 UNIT/GM EX POWD
1.0000 | Freq: Two times a day (BID) | CUTANEOUS | 3 refills | Status: AC
Start: 2022-07-01 — End: ?

## 2022-07-01 NOTE — Patient Instructions (Signed)
It was wonderful to see you today. Thank you for allowing me to be a part of your care. Below is a short summary of what we discussed at your visit today:  STI screening - Today we obtained a vaginal swab to screen for gonorrhea, chlamydia, and trichomonas - We also obtained a blood sample to screen for HIV and syphilis - As above, if the results are normal, I will send you a letter or MyChart message. If the results are abnormal, I will give you a call.     Labs Today I collected a cholesterol test and blood sugar test along with the blood work for STI screen. If the results are normal, I will send you a letter or MyChart message. If the results are abnormal, I will give you a call.    Primary care doctor graduating soon! I am graduating June 28th. You will be assigned a new PCP.  Thank you for letting me care for you! It has been wonderful getting to know you.    Please bring all of your medications to every appointment!  If you have any questions or concerns, please do not hesitate to contact us via phone or MyChart message.   Fayette Pho, MD

## 2022-07-01 NOTE — Assessment & Plan Note (Signed)
Under breast and in inguinal areas.  Discussed keeping dry as possible.  Rx nystatin powder.

## 2022-07-01 NOTE — Assessment & Plan Note (Signed)
Given that we are drawing labs today, will add on lipid panel.

## 2022-07-01 NOTE — Assessment & Plan Note (Signed)
Vaginal swab collected for GC, CH, and trichomonas.  Blood drawn for HIV and RPR.  Will follow results.

## 2022-07-01 NOTE — Assessment & Plan Note (Signed)
Given prior history of stroke, other cardiovascular risk factors, and previously elevated cholesterol and A1c, will collect another A1c and lipid panel at this time.

## 2022-07-01 NOTE — Progress Notes (Signed)
    SUBJECTIVE:   CHIEF COMPLAINT / HPI:   STI check -Concern for specific exposure? None -Current symptoms: None -Denies vaginal discharge, odor, lesions, or pain  -Contraception: Condoms -Consistent barrier method use: Yes -PAP: UTD, last 10/09/2021 NILM, negative HPV -Indication for HIV PrEP: None  Pruritic skin rash Patient has noticed pruritic skin lesions under left breast and left inguinal crease No heat, warmth, erythema Does have thickened skin changes and pruritus  PERTINENT  PMH / PSH:  Patient Active Problem List   Diagnosis Date Noted   Intertrigo 07/01/2022   Stroke (HCC) 02/06/2022   Partially empty sella (HCC) 01/11/2022   Screening examination for STI 10/09/2021   MDD (major depressive disorder), recurrent, severe, with psychosis (HCC) 07/18/2021   Acute bilateral low back pain without sciatica 06/14/2021   Depressed mood 05/22/2021   Cervical spondylosis 04/04/2021   Chronic myofascial pain 04/04/2021   Chronic complaint of left-sided weakness 01/15/2021   Smoking    Hypertension 12/22/2020   Anxiety about dying 12/22/2020   Class 2 obesity due to excess calories with body mass index (BMI) of 36.0 to 36.9 in adult    Obstructive sleep apnea 12/21/2020   Fibromuscular dysplasia (HCC) 09/14/2020   Mixed hyperlipidemia 07/21/2020   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 04/09/2019   History of prediabetes 04/09/2019   Migraine without aura and with status migrainosus, not intractable 04/09/2019   Mild intermittent asthma without complication 01/20/2014    OBJECTIVE:   BP (!) 129/96   Pulse 90   Wt 196 lb 6.4 oz (89.1 kg)   SpO2 100%   BMI 38.36 kg/m    Physical Exam General: Awake, alert, oriented, no acute distress Respiratory: Normal work of breathing, no respiratory distress Neuro: Cranial nerves II through X grossly intact, able to move all extremities spontaneously Vulva: Normal appearing vulva without rashes, lesions, or  deformities Vagina: Pale pink rugated vaginal tissue without obvious lesions, physiologic discharge of whitish color, cervix without lesion or overt tenderness with swab  Sensitive exam performed with chaperone in the room:  Jake Seats, CMA  ASSESSMENT/PLAN:   Screening examination for STI Vaginal swab collected for GC, CH, and trichomonas.  Blood drawn for HIV and RPR.  Will follow results.  Intertrigo Under breast and in inguinal areas.  Discussed keeping dry as possible.  Rx nystatin powder.  Mixed hyperlipidemia Given that we are drawing labs today, will add on lipid panel.  Stroke [facility]) Given prior history of stroke, other cardiovascular risk factors, and previously elevated cholesterol and A1c, will collect another A1c and lipid panel at this time.     Fayette Pho, MD [facility]

## 2022-07-02 ENCOUNTER — Other Ambulatory Visit (HOSPITAL_COMMUNITY): Payer: Self-pay

## 2022-07-02 ENCOUNTER — Ambulatory Visit: Payer: Medicaid Other | Admitting: Speech Pathology

## 2022-07-02 ENCOUNTER — Telehealth (HOSPITAL_COMMUNITY): Payer: Self-pay

## 2022-07-02 ENCOUNTER — Telehealth: Payer: Self-pay | Admitting: Family Medicine

## 2022-07-02 DIAGNOSIS — R41841 Cognitive communication deficit: Secondary | ICD-10-CM

## 2022-07-02 DIAGNOSIS — E782 Mixed hyperlipidemia: Secondary | ICD-10-CM

## 2022-07-02 DIAGNOSIS — B3731 Acute candidiasis of vulva and vagina: Secondary | ICD-10-CM

## 2022-07-02 DIAGNOSIS — I10 Essential (primary) hypertension: Secondary | ICD-10-CM

## 2022-07-02 DIAGNOSIS — R471 Dysarthria and anarthria: Secondary | ICD-10-CM

## 2022-07-02 DIAGNOSIS — R4701 Aphasia: Secondary | ICD-10-CM

## 2022-07-02 LAB — LIPID PANEL
Chol/HDL Ratio: 3.5 ratio (ref 0.0–4.4)
Cholesterol, Total: 153 mg/dL (ref 100–199)
HDL: 44 mg/dL (ref >39)
LDL Chol Calc (NIH): 92 mg/dL (ref 0–99)
Triglycerides: 89 mg/dL (ref 0–149)
VLDL Cholesterol Cal: 17 mg/dL (ref 5–40)

## 2022-07-02 LAB — HEMOGLOBIN A1C
Est. average glucose Bld gHb Est-mCnc: 120 mg/dL
Hgb A1c MFr Bld: 5.8 % — ABNORMAL HIGH (ref 4.8–5.6)

## 2022-07-02 LAB — CERVICOVAGINAL ANCILLARY ONLY
Bacterial Vaginitis (gardnerella): NEGATIVE
Candida Glabrata: NEGATIVE
Candida Vaginitis: POSITIVE — AB
Chlamydia: NEGATIVE
Comment: NEGATIVE
Comment: NEGATIVE
Comment: NEGATIVE
Comment: NEGATIVE
Comment: NEGATIVE
Comment: NORMAL
Neisseria Gonorrhea: NEGATIVE
Trichomonas: NEGATIVE

## 2022-07-02 LAB — HIV ANTIBODY (ROUTINE TESTING W REFLEX): HIV Screen 4th Generation wRfx: NONREACTIVE

## 2022-07-02 LAB — RPR: RPR Ser Ql: NONREACTIVE

## 2022-07-02 MED ORDER — FLUCONAZOLE 150 MG PO TABS
150.0000 mg | ORAL_TABLET | Freq: Once | ORAL | 0 refills | Status: AC
Start: 2022-07-02 — End: 2022-07-02

## 2022-07-02 MED ORDER — BLOOD PRESSURE CUFF MISC
0 refills | Status: DC
Start: 2022-07-02 — End: 2022-07-09

## 2022-07-02 MED ORDER — ROSUVASTATIN CALCIUM 20 MG PO TABS
20.0000 mg | ORAL_TABLET | Freq: Every day | ORAL | 3 refills | Status: DC
Start: 2022-07-02 — End: 2022-08-23

## 2022-07-02 NOTE — Telephone Encounter (Signed)
Called regarding lab results.  STI screening: Blood work negative for HIV and RPR Vaginal swab negative for gonorrhea, chlamydia, and trichomonas  Vaginal swab positive for Candida -Offered Diflucan  Cardiovascular restratification labs: A1c 5.8, prediabetic range A1c previously 6.0 documented 10/30/2018 through [employer] and exercise changes Recheck 3 months  Lipid panel shows LDL 92, above goal of <70 Currently on rosuvastatin 10 Will increase to 20 mg and recheck one month  BP elevated in clinic We did not get second recheck Patient currently on olmesartan-HCTZ 20-12.5 mg daily Patient reports she was quite nervous for STI check yesterday and that is probably why her BP was up.  Rx BP cuff, patient to check BP each morning for one week and send me a message with the pressures.    Fayette Pho, MD

## 2022-07-02 NOTE — Therapy (Signed)
OUTPATIENT SPEECH LANGUAGE PATHOLOGY TREATMENT NOTE   Patient Name: Christine Gordon MRN: [id number] DOB:16-Dec-1980, 42 y.o., female Today's Date: 07/02/2022  PCP: Fayette Pho, MD REFERRING PROVIDER: Billey Co, MD  END OF SESSION:  End of Session - 07/02/22 1026     Visit Number 5    Number of Visits 13   for scheduling   Date for SLP Re-Evaluation 07/05/22    Authorization Type Medicaid    SLP Start Time 1026    SLP Stop Time  1100    SLP Time Calculation (min) 34 min    Activity Tolerance Patient tolerated treatment well                Past Medical History:  Diagnosis Date   Abdominal bloating 12/22/2020   Bleeding nose 12/22/2020   Contraception management 10/09/2017   Depo-provera   COVID-19 12/22/2020   COVID infection June 2022.    H/O cesarean section    x3 perpatient   Healthcare maintenance 12/22/2020   Hypertension    Knee pain 04/04/2021   Microscopic hematuria 05/24/2020   Formatting of this note might be different from the original. Urinalysis 03/22 and 04/22. No urine infection Referred to Urology 05/22   Pain in both feet 10/31/2021   Routine screening for STI (sexually transmitted infection) 10/09/2021   Viral illness 11/21/2021   Vitamin D deficiency 07/21/2020   Past Surgical History:  Procedure Laterality Date   CESAREAN SECTION N/A    x3 per patient   Patient Active Problem List   Diagnosis Date Noted   Intertrigo 07/01/2022   Stroke (HCC) 02/06/2022   Partially empty sella (HCC) 01/11/2022   Screening examination for STI 10/09/2021   MDD (major depressive disorder), recurrent, severe, with psychosis (HCC) 07/18/2021   Acute bilateral low back pain without sciatica 06/14/2021   Depressed mood 05/22/2021   Cervical spondylosis 04/04/2021   Chronic myofascial pain 04/04/2021   Chronic complaint of left-sided weakness 01/15/2021   Smoking    Hypertension 12/22/2020   Anxiety about dying 12/22/2020   Class 2 obesity due to  excess calories with body mass index (BMI) of 36.0 to 36.9 in adult    Obstructive sleep apnea 12/21/2020   Fibromuscular dysplasia (HCC) 09/14/2020   Mixed hyperlipidemia 07/21/2020   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   PERTINENT HISTORY: Christine Gordon is a 42 y.o. female with history of stroke, migraine, right MCA bifurcation cerebral aneurysm status post clipping, fibromuscular dysplasia presenting to the ED for headache and eye pain.   PAIN:  Are you having pain? 3/10 headache   FALLS: Has patient fallen in last 6 months?  No  LIVING ENVIRONMENT: Lives with: lives with their family (son and dog) Lives in: House/apartment  PLOF:  Level of assistance: Independent with ADLs, Independent with IADLs Employment: Other: Appealing for disability, hasn't worked since November 2023  PATIENT GOALS: "I want to be able to remember" "Get back to doing everything"  OBJECTIVE:  TODAY'S TREATMENT:  07/02/22: Pt  with challenges with recall of events happening daily. SLP provided direct instruction for implementation of daily journal to support recall. Provided pt with weekly journal template. Pt able to generate x1 item to add to weekly to-do list and x1 item remarkable to recall from yesterday, 07/01/22. Education provided on journal to support recall and creating routine to support building new habit. Pt unable to determine best time of  day to complete, SLP recommends sometime in evening before overly tired.   06/27/22: Modeled use of reminders app with education provided on assistive features of location, time, and reoccurring features. Skill practice in using reminders app as external memory aid to support recall of prospective memory tasks. Given written appointment/event to add, pt successful in adding x8 events with use of location and time based alerts and reoccurring feature. Consistent max cueing required to successfully use app. SLP provided written instructions to aid in home practice.   06/25/2022: Ongoing education and training regarding use of external memory aids to support impaired recall. Given scenario, pt able to verbalize appropriate memory aid in 4/5 opportunities. SLP assists pt in generation of external aid (checklist) for upcoming move. Challenges in identifying ways to recall prospective memory items which occur far in future, e.g. asking doctor information at next visit. SLP educates on and provides direct instruction for use of reminders app on phone. Pt adds x1 reminder in phone with max-A from SLP. Pt agrees this would be helpful tool if she could become more proficent in it's use. Plan to practice in upcoming session.   06-04-22: Pt reported that she has been using a pill box to organize her meds and has noticed an improvement in managing her meds. Although her son checks behind her,she has not made any mistakes recently with med admin. She continues to use a calendar to follow her appointments. She reported a safety concern with using her stove -- she left it on and accidentally fell asleep. SLP facilitated discussion on safety concerns and how pt could prevents this from occurring again through utilizing external memory aids, such as setting timers. Pt endorsed mixing up her sons' names over the phone and also forgetting pertinent information when making phone calls. Continued educating pt on memory strategies and  compensations, emphasizing importance of writing information down.  Discussed importance of self advocating to reduce cognitive and emotional load. Pt stated when she has moments of anomia she stops communicating. Initiated semantic feature analysis to target aphasia. Given target word, pt generated 5 salient features given mod-A and visual support. Educated pt on importance of practicing her language to improve her aphasia and overall ability to communicate.  Plan to use writing in treatment next session, as pt says that is a strength when she struggles with anomia.  Provided handout on SFA and home practice.   05-10-22: Pt lives at home with her son who is 54. She has lived in [place] for a year and her family lives 3 hours away. She reported that she lacks support. Education provided on evaluation results and SLP's recommendations. Pt verbalizes agreement with POC, all questions answered to satisfaction.   PATIENT EDUCATION: Education details: See above Person educated: Patient Education method: Explanation Education comprehension: verbalized understanding and needs further education   GOALS: Goals reviewed with patient? Yes  SHORT TERM GOALS: Target date: 06-07-22  Pt will teach back attention and memory strategies/compensations given mod-A.  Baseline: Not present Goal status: NOT MET  2.  Pt will successfully demonstrate anomia compensation during structured  tasks in 80% of opportunities given mod-A.  Baseline: Not present Goal status: NOT MET  3.  Pt will demonstrate clear voice and dysarthria strategies PRN during 5-minute structured conversation given mod-A. Baseline: Not present; 06/25/22 Goal status: MET  4.  Pt will identify appropriate external cognitive aid to support improved participation in home-based tasks.  Baseline: Not present; 06/04/22 Goal status: MET  LONG TERM GOALS: Target date: 07-05-22  Pt will report implementing attention and memory strategies to aid in  home-based tasks across 1 week period.  Baseline: Not present Goal status: IN PROGRESS  2.  Pt will successfully demonstrate anomia compensation during structured tasks in 80% of opportunities given min-A.  Baseline: Not present Goal status: IN PROGRESS  3.  Pt will demonstrate clear voice and dysarthria strategies PRN during 5-minute structured conversation given min-A. Baseline: Not present Goal status: IN PROGRESS  4.  Pt will report use of 2 external cognitive aids to support improved participation in home-based tasks.  Baseline: Not present Goal status: IN PROGRESS  5.  Pt will report subjective perception of improvement via PROM by d/c.  Baseline: Provided pt with PROM. To be returned by initial session  Goal status: IN PROGRESS  ASSESSMENT:  CLINICAL IMPRESSION: Ongoing impairments in language, motor speech, and cognition. Progress limited d/t limited sessions attended, with pt reporting motivation for attending ST sessions is low. Re-education provided on purpose of therapy and need for participation in order to see positive effects. Skilled interventions this date targeted use of external memory supports. Skilled ST continues to be indicated to address language, motor speech, and cognition to improve QoL, increase participation in home based tasks and iADLs, and maximize independence.   OBJECTIVE IMPAIRMENTS: include attention, memory, executive functioning, receptive language, aphasia, and voice disorder. These impairments are limiting patient from managing medications, managing appointments, managing finances, household responsibilities, ADLs/IADLs, effectively communicating at home and in community, and participating in preferred activities . Factors affecting potential to achieve goals and functional outcome are family/community support. Patient will benefit from skilled SLP services to address above impairments and improve overall function.  REHAB POTENTIAL:  Good  PLAN:  SLP FREQUENCY: 2x/week  SLP DURATION: 8 weeks  PLANNED INTERVENTIONS: Language facilitation, Cognitive reorganization, Internal/external aids, Functional tasks, SLP instruction and feedback, Compensatory strategies, and Patient/family education    Maia Breslow, CCC-SLP 07/02/2022, 10:26 AM

## 2022-07-02 NOTE — Patient Instructions (Signed)
Grocery List -- roast with mustard greens, baked mac, and cornbread  Beef roast  [place] necks  Mustard greens  Onions   Mac n cheese noodles  Condensed milk   Mild shredded cheddar  3 blend shredded cheese  Butter     Steps: Start greens  Get roast into oven  When those are halfway done, start the mac n cheese  When mac is about done, start cornbread   Strategies:  Do it on a day when you have energy

## 2022-07-04 ENCOUNTER — Ambulatory Visit: Payer: Medicaid Other | Admitting: Speech Pathology

## 2022-07-08 ENCOUNTER — Encounter: Payer: Self-pay | Admitting: Family Medicine

## 2022-07-08 DIAGNOSIS — B379 Candidiasis, unspecified: Secondary | ICD-10-CM

## 2022-07-08 DIAGNOSIS — I10 Essential (primary) hypertension: Secondary | ICD-10-CM

## 2022-07-08 DIAGNOSIS — M1990 Unspecified osteoarthritis, unspecified site: Secondary | ICD-10-CM

## 2022-07-09 ENCOUNTER — Encounter: Payer: Self-pay | Admitting: Speech Pathology

## 2022-07-09 ENCOUNTER — Ambulatory Visit: Payer: Medicaid Other | Admitting: Speech Pathology

## 2022-07-09 DIAGNOSIS — R4701 Aphasia: Secondary | ICD-10-CM

## 2022-07-09 DIAGNOSIS — R471 Dysarthria and anarthria: Secondary | ICD-10-CM

## 2022-07-09 DIAGNOSIS — R41841 Cognitive communication deficit: Secondary | ICD-10-CM

## 2022-07-09 MED ORDER — FLUCONAZOLE 150 MG PO TABS
150.0000 mg | ORAL_TABLET | ORAL | 2 refills | Status: AC
Start: 2022-07-09 — End: 2022-07-13

## 2022-07-09 MED ORDER — BLOOD PRESSURE CUFF MISC
0 refills | Status: AC
Start: 2022-07-09 — End: ?

## 2022-07-09 NOTE — Therapy (Signed)
[facility] [address] [place], [place], [address] Phone: [phone]   Fax:  [phone]  Patient Details  Name: Christine Gordon MRN: [id number] Date of Birth: 09-14-1980 Referring Provider:  No ref. provider found  Encounter Date: 07/09/2022  SPEECH THERAPY DISCHARGE SUMMARY  Visits from Start of Care: 5  Current functional level related to goals / functional outcomes: Pt was able to demonstrate use of external memory aids given mod-A from SLP during structured practice. Engaged in structured language tasks with mod-A. Improving dysarthria with pt demonstrating mod-I implementation of fluency and dysarthria strategies resulting in intelligible speech. Word finding deficits persist.    Remaining deficits: Cognitive impairment, dysarthria, expressive language impairment   Education / Equipment: Attention and memory strategies/compensations; anomia strategies; structured language tasks; HEP   Patient agrees to discharge. Patient goals were partially met. Limited progress appreciated d/t pt canceling 2 weeks of appointments and then requesting early d/c. Pt's mental status appears to be a barrier to increased participation and implementation of strategies and compensations. Patient is being discharged due to the patient's request.  GOALS: Goals reviewed with patient? Yes   SHORT TERM GOALS: Target date: 06-07-22   Pt will teach back attention and memory strategies/compensations given mod-A.  Baseline: Not present Goal status: NOT MET   2.  Pt will successfully demonstrate anomia compensation during structured tasks in 80% of opportunities given mod-A.  Baseline: Not present Goal status: NOT MET   3.  Pt will demonstrate clear voice and dysarthria strategies PRN during 5-minute structured conversation given mod-A. Baseline: Not present; 06/25/22 Goal status: MET   4.  Pt will identify appropriate external cognitive aid to support  improved participation in home-based tasks.  Baseline: Not present; 06/04/22 Goal status: MET   LONG TERM GOALS: Target date: 07-05-22   Pt will report implementing attention and memory strategies to aid in home-based tasks across 1 week period.  Baseline: Not present Goal status: NOT MET   2.  Pt will successfully demonstrate anomia compensation during structured tasks in 80% of opportunities given min-A.  Baseline: Not present Goal status: NOT MET   3.  Pt will demonstrate clear voice and dysarthria strategies PRN during 5-minute structured conversation given min-A. Baseline: Not present Goal status: MET   4.  Pt will report use of 2 external cognitive aids to support improved participation in home-based tasks.  Baseline: Not present Goal status: NOT MET   5.  Pt will report subjective perception of improvement via PROM by d/c.  Baseline: Provided pt with PROM. To be returned by initial session  Goal status: NOT MET  Maia Breslow, CCC-SLP 07/09/2022, 7:55 AM  [facility] [address] [place], [place], [address] Phone: [phone]   Fax:  [phone]

## 2022-07-10 MED ORDER — DICLOFENAC SODIUM 1 % EX GEL
2.0000 g | Freq: Four times a day (QID) | CUTANEOUS | 2 refills | Status: DC
Start: 2022-07-10 — End: 2022-08-23

## 2022-07-10 NOTE — Addendum Note (Signed)
Addended by: Valetta Close on: 07/10/2022 02:57 PM   Modules accepted: Orders

## 2022-07-11 ENCOUNTER — Ambulatory Visit: Payer: Medicaid Other | Admitting: Speech Pathology

## 2022-07-11 ENCOUNTER — Ambulatory Visit (INDEPENDENT_AMBULATORY_CARE_PROVIDER_SITE_OTHER): Payer: Medicaid Other | Admitting: Clinical

## 2022-07-11 DIAGNOSIS — F332 Major depressive disorder, recurrent severe without psychotic features: Secondary | ICD-10-CM

## 2022-07-12 NOTE — Progress Notes (Signed)
THERAPIST PROGRESS NOTE Virtual Visit via Video Note  I connected with Velencia Ho on 07/11/2022 at  4:00 PM EDT by a video enabled telemedicine application and verified that I am speaking with the correct person using two identifiers.  Location: Patient: home Provider: office   I discussed the limitations of evaluation and management by telemedicine and the availability of in person appointments. The patient expressed understanding and agreed to proceed.   Follow Up Instructions: I discussed the assessment and treatment plan with the patient. The patient was provided an opportunity to ask questions and all were answered. The patient agreed with the plan and demonstrated an understanding of the instructions.   The patient was advised to call back or seek an in-person evaluation if the symptoms worsen or if the condition fails to improve as anticipated.    Session Time: 30 minutes  Participation Level: Active  Behavioral Response: CasualAlertIrritable  Type of Therapy: Individual Therapy  Treatment Goals addressed: client will participate in 80% of scheduled individual psychotherapy sessions  ProgressTowards Goals: Progressing  Interventions: CBT and Supportive  Summary:  Kalolaine Hiott is a 42 y.o. female who presents for the scheduled appointment oriented times five, appropriately dressed, and friendly. Client denied hallucinations and delusions. Client reported she has been doing fairly okay. Client reported she has been working on anger, outburst, patience and teaching herself to get out of her head. Client reported she tries to keep herself busy or else she becomes consumed in her thoughts. Client reported she has had a tendency to blurt out her thoughts. Client reported she does not mean to do it. Client reported she researched and saw that people having brain surgery can have the after affect of not being able to control their thoughts and what they say. Client reported  she is going to call to schedule a follow up because she still has swelling and tenderness on the right side of her head. Client reported she has also been working on how to move forward in her life. Client reported she took the initiative to call her sister. Client reported she wanted to let her sister know that she forgives her and still loves her. Client reported her sister was not receptive to what she had to say. Client reported she was fine with that because she wanted her intentions to be known. Client reported she is fine with not being in communication with her or others from her mother side of the family. Client reported she still gets angry but when she does she reads the bible. Evidence of progress towards goal:  client reported 1 positive behavioral skills to help redirect her anger by reading the bible.  Suicidal/Homicidal: Nowithout intent/plan  Therapist Response:  Therapist began the appointment asking the client how she has been doing since last seen. Therapist used CBT to engage using active listening and positive emotional support. Therapist used CBT to engage and ask the client about her current thoughts and feelings and origin of emotions. Therapist used CBT to engage and positively reinforce the client initiative to work on forgiveness and acceptance. Therapist used CBT ask the client to identify her progress with frequency of use with coping skills with continued practice in her daily activity.    Therapist assigned the client homework to practice self care.   Plan: Return again in 4 weeks.  Diagnosis: mdd, recurrent episode, severe without psychotic features  Collaboration of Care: Patient refused AEB none requested by the client.  Patient/Guardian was advised Release  of Information must be obtained prior to any record release in order to collaborate their care with an outside provider. Patient/Guardian was advised if they have not already done so to contact the  registration department to sign all necessary forms in order for [place] to release information regarding their care.   Consent: Patient/Guardian gives verbal consent for treatment and assignment of benefits for services provided during this visit. Patient/Guardian expressed understanding and agreed to proceed.   Neena Rhymes Winry Egnew, LCSW 07/11/2022

## 2022-07-16 ENCOUNTER — Ambulatory Visit: Payer: Medicaid Other | Admitting: Speech Pathology

## 2022-07-17 ENCOUNTER — Other Ambulatory Visit: Payer: Self-pay | Admitting: Internal Medicine

## 2022-07-17 ENCOUNTER — Other Ambulatory Visit: Payer: Self-pay | Admitting: Podiatry

## 2022-07-17 ENCOUNTER — Other Ambulatory Visit: Payer: Self-pay | Admitting: Family Medicine

## 2022-07-19 ENCOUNTER — Telehealth (INDEPENDENT_AMBULATORY_CARE_PROVIDER_SITE_OTHER): Payer: Medicaid Other | Admitting: Psychiatry

## 2022-07-19 DIAGNOSIS — F411 Generalized anxiety disorder: Secondary | ICD-10-CM

## 2022-07-19 DIAGNOSIS — F333 Major depressive disorder, recurrent, severe with psychotic symptoms: Secondary | ICD-10-CM

## 2022-07-19 MED ORDER — QUETIAPINE FUMARATE 100 MG PO TABS
100.0000 mg | ORAL_TABLET | Freq: Every day | ORAL | 1 refills | Status: AC
Start: 2022-07-19 — End: ?

## 2022-07-19 MED ORDER — DULOXETINE HCL 30 MG PO CPEP
30.0000 mg | ORAL_CAPSULE | Freq: Two times a day (BID) | ORAL | 1 refills | Status: AC
Start: 2022-07-19 — End: ?

## 2022-07-19 NOTE — Progress Notes (Signed)
BH MD/PA/NP OP Progress Note  07/19/2022 3:30 PM Christine Gordon  MRN:  [id number] Virtual Visit via Video Note  I connected with Christine Gordon on 07/19/22 at  3:00 PM EDT by a video enabled telemedicine application and verified that I am speaking with the correct person using two identifiers.  Location: Patient: Home Provider: Clinic   I discussed the limitations of evaluation and management by telemedicine and the availability of in person appointments. The patient expressed understanding and agreed to proceed.  I discussed the assessment and treatment plan with the patient. The patient was provided an opportunity to ask questions and all were answered. The patient agreed with the plan and demonstrated an understanding of the instructions.   The patient was advised to call back or seek an in-person evaluation if the symptoms worsen or if the condition fails to improve as anticipated.  I provided 30 minutes of non-face-to-face time during this encounter.   Karsten Ro, MD   Chief Complaint:  Chief Complaint  Patient presents with   Follow-up   Subjective: Patient is a 42 y.o.  female with past psychiatric history of MDD with psychosis, GAD and medical history of brain aneurysm s/p surgical resection at [facility] on January'24, stroke, HTN, HLD, back pain due to sciatica, cervical spondylosis, chronic myofascial pain, prediabetes, asthma and migraine presented to [facility] outpatient clinic virtually for medication management for depression.  Pt reports that her mood has been ok. She reports some improvement in her anxiety and depression.  She thinks that her memory and speech are improving too.  She is sleeping ok  but wakes up couple of times at night and has weird dreams which she does not remember.  She reports that when she wakes up she feels like a car with head light is coming towards her.  She reports stable appetite.  She is following up with neurosurgeon on Wednesday.  She is reporting  mood swings, some irritability and racing thoughts.  Currently, she denies any active suicidal ideations, and homicidal ideations.  She denies passive SI.  She has chronic vague thoughts about hurting people from her past but denies any plan or intent.  Feels safe at home.  She contracts for safety at this time and will go to urgent care if her chronic homicidal thoughts get worse. She has chronic visual hallucinations of seeing little boy since childhood.  She does not get stressed by this.  She denies AH.  She denies illicit drugs or alcohol use.  She does not smoke cigarettes.  Discussed increasing Seroquel to help with sleep, racing thoughts, mood swings, irritability and depression but patient wants to keep medication at the same dose.  Patient is also moving to [place] and will not follow-up at [facility].  Past Psychiatric History: History of MDD with psychosis. She reports 2 past suicidal attempts when she was 14-61 years old by putting a knife to her throat and trying to overdose.  She reports that she never actually overdosed.  She denies past psychiatric hospitalizations.  She has never been on any psychotropic medications.  She gets therapy at [facility] every 2 weeks.  Past Medical History:  Past Medical History:  Diagnosis Date   Abdominal bloating 12/22/2020   Bleeding nose 12/22/2020   Contraception management 10/09/2017   Depo-provera   COVID-19 12/22/2020   COVID infection June 2022.    H/O cesarean section    x3 perpatient   Healthcare maintenance 12/22/2020   Hypertension  Knee pain 04/04/2021   Microscopic hematuria 05/24/2020   Formatting of this note might be different from the original. Urinalysis 03/22 and 04/22. No urine infection Referred to Urology 05/22   Pain in both feet 10/31/2021   Routine screening for STI (sexually transmitted infection) 10/09/2021   Viral illness 11/21/2021   Vitamin D deficiency 07/21/2020    Past Surgical History:  Procedure Laterality Date    CESAREAN SECTION N/A    x3 per patient    Family Psychiatric History: None  Family History:  Family History  Problem Relation Age of Onset   Healthy Mother    Colon cancer Sister    High Cholesterol Maternal Aunt    High blood pressure Maternal Aunt    Diabetes Paternal Aunt    Diabetes Paternal Uncle    Diabetes Paternal Grandmother    Esophageal cancer Neg Hx    Pancreatic cancer Neg Hx    Stomach cancer Neg Hx     Social History:  Social History   Socioeconomic History   Marital status: Single    Spouse name: Not on file   Number of children: 4   Years of education: Not on file   Highest education level: Not on file  Occupational History   Not on file  Tobacco Use   Smoking status: Former    Types: Cigarettes    Quit date: 2019    Years since quitting: 5.3   Smokeless tobacco: Never  Vaping Use   Vaping Use: Never used  Substance and Sexual Activity   Alcohol use: Never   Drug use: Never   Sexual activity: Not on file  Other Topics Concern   Not on file  Social History Narrative   Ms. Doolan has 4 children: daughter (b ~17) and 3 sons (b.~2021),(b.2003), and (b. ~2005).   2 oldest sons live in [place] and her youngest son lives with her   Right handed   No caffeine   Social Determinants of Health   Financial Resource Strain: Not on file  Food Insecurity: Not on file  Transportation Needs: Not on file  Physical Activity: Not on file  Stress: Stress Concern Present (08/15/2021)   [employer] of Occupational Health - Occupational Stress Questionnaire    Feeling of Stress : To some extent  Social Connections: Not on file    Allergies:  Allergies  Allergen Reactions   Nsaids Other (See Comments)    NSAIDs contraindicated due to cerebral aneurysm and fibromuscular dysplasia.   Triptans Other (See Comments)    Triptans are contraindicated due to cerebral aneurysm and fibromuscular dysplasia.     Metabolic Disorder Labs: Lab Results   Component Value Date   HGBA1C 5.8 (H) 07/01/2022   No results found for: "PROLACTIN" Lab Results  Component Value Date   CHOL 153 07/01/2022   TRIG 89 07/01/2022   HDL 44 07/01/2022   CHOLHDL 3.5 07/01/2022   LDLCALC 92 07/01/2022   Lab Results  Component Value Date   TSH 2.15 04/23/2021    Therapeutic Level Labs: No results found for: "LITHIUM" No results found for: "VALPROATE" No results found for: "CBMZ"  Current Medications: Current Outpatient Medications  Medication Sig Dispense Refill   acetaminophen (TYLENOL 8 HOUR) 650 MG CR tablet Take 1 tablet (650 mg total) by mouth every 6 (six) hours as needed for pain or fever. 30 tablet 1   Alpha-D-Galactosidase (GAS-X PREVENTION) CAPS Take 80 capsules by mouth as directed. 180 capsule 1   aspirin  EC 81 MG tablet Take 81 mg by mouth daily. Swallow whole.     benzonatate (TESSALON PERLES) 100 MG capsule Take 1 capsule (100 mg total) by mouth 3 (three) times daily as needed for cough. (Patient not taking: Reported on 04/15/2022) 20 capsule 0   Blood Pressure Monitoring (BLOOD PRESSURE CUFF) MISC Use to check blood pressure as directed by your doctor. 1 each 0   butalbital-acetaminophen-caffeine (FIORICET) 50-325-40 MG tablet Take 1-2 tablets by mouth every 6 (six) hours as needed for headache. 20 tablet 0   Capsaicin-Menthol-Methyl Sal 0.001-10-20 % LOTN Apply to knee daily for pain relief. (Patient not taking: Reported on 04/15/2022) 113.4 g 3   [employer] SALINE NASAL SPRAY 0.65 % nasal spray SPRAY 1 SPRAY INTO EACH NOSTRIL AS NEEDED FOR CONGESTION 60 mL 11   cyclobenzaprine (FLEXERIL) 5 MG tablet Take 1 tablet (5 mg total) by mouth 3 (three) times daily as needed. 30 tablet 0   diclofenac Sodium (VOLTAREN) 1 % GEL Apply 2 g topically 4 (four) times daily. 350 g 2   docusate sodium (COLACE) 250 MG capsule Take 1 capsule (250 mg total) by mouth daily. 30 capsule 3   DULoxetine (CYMBALTA) 30 MG capsule Take 1 capsule (30 mg total) by mouth 2  (two) times daily. 60 capsule 1   famotidine (PEPCID) 20 MG tablet Take 1 tablet (20 mg total) by mouth at bedtime. 90 tablet 3   Galcanezumab-gnlm (EMGALITY) 120 MG/ML SOAJ Inject 120 mg into the skin every 28 (twenty-eight) days. 1.12 mL 5   guaiFENesin (MUCINEX) 600 MG 12 hr tablet Take 1 tablet (600 mg total) by mouth 2 (two) times daily. (Patient not taking: Reported on 04/15/2022) 14 tablet 0   ketoconazole (NIZORAL) 2 % cream Apply 1 Application topically daily. 60 g 2   levETIRAcetam (KEPPRA) 1000 MG tablet Take by mouth.     lidocaine (XYLOCAINE) 5 % ointment APPLY TOPICALLY 1 APPLICATION AS NEEDED 35.44 g 3   LINZESS 290 MCG CAPS capsule TAKE 1 CAPSULE BY MOUTH DAILY BEFORE BREAKFAST. 30 capsule 6   nystatin (MYCOSTATIN/NYSTOP) powder Apply 1 Application topically 2 (two) times daily. Apply to skin-on-skin areas such as breasts, belly, and inguinal creases 60 g 3   olmesartan-hydrochlorothiazide (BENICAR HCT) 20-12.5 MG tablet TAKE 1 TABLET BY MOUTH EVERY DAY 90 tablet 3   omeprazole (PRILOSEC) 40 MG capsule Take 1 capsule (40 mg total) by mouth daily. Take one capsule 30 minutes before breakfast daily. 90 capsule 3   ondansetron (ZOFRAN-ODT) 4 MG disintegrating tablet Take 4 mg by mouth every 8 (eight) hours as needed.     oxyCODONE (OXY IR/ROXICODONE) 5 MG immediate release tablet Take 5 mg by mouth every 6 (six) hours as needed.     prochlorperazine (COMPAZINE) 10 MG tablet Take 1 tablet (10 mg total) by mouth 2 (two) times daily as needed (Headache). 20 tablet 0   QUEtiapine (SEROQUEL) 100 MG tablet Take 1 tablet (100 mg total) by mouth at bedtime. 30 tablet 1   rosuvastatin (CRESTOR) 20 MG tablet Take 1 tablet (20 mg total) by mouth daily. 90 tablet 3   senna (SENOKOT) 8.6 MG TABS tablet Take 1 tablet (8.6 mg total) by mouth daily. 120 tablet 2   Ubrogepant (UBRELVY) 100 MG TABS Take 1 tablet (100 mg total) by mouth as needed. May repeat after 2 hours.  Maximum 2 tablets in 24 hours.  10 tablet 5   VENTOLIN HFA 108 (90 Base) MCG/ACT inhaler TAKE 2 PUFFS  BY MOUTH EVERY 6 HOURS AS NEEDED 18 each 3   verapamil (CALAN-SR) 120 MG CR tablet Take 1 tablet (120 mg total) by mouth at bedtime. 90 tablet 3   Vitamin D, Ergocalciferol, (DRISDOL) 1.25 MG (50000 UNIT) CAPS capsule Take 1 capsule (50,000 Units total) by mouth every 7 (seven) days. 12 capsule 1   zonisamide (ZONEGRAN) 100 MG capsule Take 1 capsule (100 mg total) by mouth daily. 30 capsule 5   No current facility-administered medications for this visit.     Musculoskeletal: Strength & Muscle Tone: Not evaluated due to virtual visit. Gait-not able to evaluate due to virtual visit. Patient leans: N/A  Psychiatric Specialty Exam: Review of Systems  There were no vitals taken for this visit.There is no height or weight on file to calculate BMI.  General Appearance: Casual  Eye Contact:  Fair  Speech:  Clear and Coherent and Normal Rate  Volume:  Normal  Mood:  Anxious and Dysphoric  Affect:  Congruent and Depressed  Thought Process:  Coherent  Orientation:  Full (Time, Place, and Person)  Thought Content: Hallucinations: Visual (chronic),Denies AH  Suicidal Thoughts:  No  Homicidal Thoughts:  Yes.  without intent/plan chronic vague thoughts of  hurting people from her past but denies any plan or intent.  Contracts for safety at this time.  Memory:  Immediate;   Good Recent;   Fair Remote;   Fair  Judgement:  Fair  Insight:  Fair  Psychomotor Activity:  Normal  Concentration:  Concentration: Fair and Attention Span: Fair  Recall:  [employer] of Knowledge: Fair  Language: Good  Akathisia:  No  Handed:  Right  AIMS (if indicated): not done  Assets:  Communication Skills Desire for Improvement Housing Resilience  ADL's:  Intact  Cognition: WNL  Sleep:  Fair   Screenings: GAD-7    Flowsheet Row Office Visit from 06/13/2021 in [facility]  Total GAD-7 Score 13      PHQ2-9     Flowsheet Row Counselor from 03/19/2022 in [facility] Counselor from 02/07/2022 in [facility] Office Visit from 01/10/2022 in [facility] Office Visit from 10/09/2021 in [facility] Counselor from 07/18/2021 in [facility]  PHQ-2 Total Score 6 6 2 2 6   PHQ-9 Total Score 18 18 8 6 14       Flowsheet Row ED from 04/26/2022 in [facility] Emergency Department at [facility] ED from 04/17/2022 in [facility] Emergency Department at [facility] Counselor from 02/07/2022 in [facility]  C-SSRS RISK CATEGORY No Risk No Risk Moderate Risk        Assessment and Plan: Patient is a 42 y.o.  female with past psychiatric history of MDD with psychosis, GAD and medical history of brain aneurysm s/p surgical resection at [facility] on January'24, stroke, HTN, HLD, back pain due to sciatica, cervical spondylosis, chronic myofascial pain, prediabetes, asthma and migraine presented to [facility] outpatient clinic virtually for medication management for depression. Patient has been reporting poor sleep, irritability, mood swings and anxiety.  Discussed increasing Seroquel dose but patient wants to keep medication at the same dose.  Patient will be moving to [place] and will not follow-up at [facility].  Labs reviewed-EKG QTc 442  MDD with psychosis GAD -Continue Cymbalta 30 mg twice daily. 30-day prescription with 1 refills sent to her pharmacy. -Continue Seroquel to 100  mg nightly for mood stability.(QTc 442 ).30-day prescription with 1 refills sent to her pharmacy.  Patient will be moving to [place] in June end and will not follow-up at [facility].  Transfer of care: Informed patient that provider will be leaving in June and if she decides to stay here then patient 's care will be transferred to another provider beginning July.   Collaboration of Care:  Collaboration of Care: Dr. Adrian Blackwater  Patient/Guardian was advised Release of Information must be obtained prior to any record release in order to collaborate their care with an outside provider. Patient/Guardian was advised if they have not already done so to contact the registration department to sign all necessary forms in order for [place] to release information regarding their care.   Consent: Patient/Guardian gives verbal consent for treatment and assignment of benefits for services provided during this visit. Patient/Guardian expressed understanding and agreed to proceed.    Karsten Ro, MD 07/19/2022, 3:30 PM

## 2022-07-22 ENCOUNTER — Encounter (HOSPITAL_COMMUNITY): Payer: Self-pay | Admitting: Psychiatry

## 2022-07-22 ENCOUNTER — Encounter: Payer: Self-pay | Admitting: Podiatry

## 2022-07-22 ENCOUNTER — Ambulatory Visit (INDEPENDENT_AMBULATORY_CARE_PROVIDER_SITE_OTHER): Payer: Medicaid Other | Admitting: Podiatry

## 2022-07-22 DIAGNOSIS — M7752 Other enthesopathy of left foot: Secondary | ICD-10-CM | POA: Diagnosis not present

## 2022-07-22 DIAGNOSIS — M778 Other enthesopathies, not elsewhere classified: Secondary | ICD-10-CM

## 2022-07-22 NOTE — Progress Notes (Signed)
  Subjective:  Patient ID: Christine Gordon, female    DOB: 12-07-1980,  MRN: [id number]  Chief Complaint  Patient presents with   Foot Pain    left foot pain - injection - she has also had some swelling in both feet and lower legs     42 y.o. female presents with the above complaint. History confirmed with patient.  Foot is still quite painful on the top of the arch.  The swelling goes up to the top of her calves and knees  Objective:  Physical Exam: warm, good capillary refill, no trophic changes or ulcerative lesions, normal DP and PT pulses, normal sensory exam, and diffuse midfoot pain and edema on the left area over the TN joint, mild in nature, good range of motion of subtalar and midfoot joints, some tenderness with midfoot range of motion, she has no notable pitting edema, there is some swelling around the proximal calves and knees  Radiographs: Multiple views x-ray of the left foot: Possible joint space narrowing of lateral portion of intermediate lateral navicular cuneiform joint  Lab work was unrevealing  MRI showed osteophyte formation around the dorsal  joint  Assessment:   1. Capsulitis of foot, left      Plan:  Patient was evaluated and treated and all questions answered.  We reviewed the results of her lab work and MRI.  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  We discussed treatment of this and I recommended corticosteroid injection which she has not had before.  We discussed the risks and benefits of this.  Following consent and prepped with Betadine and use of ethyl chloride spray, 10 mg of Kenalog and 0.5 cc of 0.5% Marcaine plain was injected into the dorsal transverse arch along the navicular cuneiform joints.  She tolerated this well and it was dressed with a Band-Aid.  She will return to see me as needed for this.  Do not think the swelling issues are related to primary foot issue.  Advised her to discuss with PCP as well as seek opinion  from orthopedist regarding any issues with the knees  Return if symptoms worsen or fail to improve.

## 2022-07-25 ENCOUNTER — Telehealth: Payer: Self-pay | Admitting: Family Medicine

## 2022-07-25 ENCOUNTER — Encounter: Payer: Self-pay | Admitting: Family Medicine

## 2022-07-25 NOTE — Telephone Encounter (Signed)
Glad BP is at goal. Two medications for BP on file are verapamil 120 mg and olmesartan-HCTZ (Benicar) 20-12.5 mg. I believe the patient is talking about the Benicar not being covered.  Odd, as this is on the [facility] formulary.   Spoke with Eliberto Ivory at the pharmacy, who reports the patient has recently filled both the verapamil and olmesartan-HCTZ for $4 co-pay recently.   Given that these are her only two anti-hypertensives I'm not sure what other medication she is needing. Will send MyChart message asking for clarification.  Fayette Pho, MD

## 2022-08-02 ENCOUNTER — Other Ambulatory Visit (HOSPITAL_COMMUNITY): Payer: Self-pay

## 2022-08-11 ENCOUNTER — Emergency Department (HOSPITAL_COMMUNITY): Payer: Medicaid Other

## 2022-08-11 ENCOUNTER — Other Ambulatory Visit: Payer: Self-pay

## 2022-08-11 ENCOUNTER — Emergency Department (HOSPITAL_COMMUNITY)
Admission: EM | Admit: 2022-08-11 | Discharge: 2022-08-11 | Disposition: A | Discharge status: Home / Self Care | Payer: Medicaid Other | Attending: Emergency Medicine | Admitting: Emergency Medicine

## 2022-08-11 DIAGNOSIS — R519 Headache, unspecified: Secondary | ICD-10-CM

## 2022-08-11 DIAGNOSIS — H538 Other visual disturbances: Secondary | ICD-10-CM | POA: Insufficient documentation

## 2022-08-11 DIAGNOSIS — R531 Weakness: Secondary | ICD-10-CM | POA: Diagnosis not present

## 2022-08-11 DIAGNOSIS — I10 Essential (primary) hypertension: Secondary | ICD-10-CM | POA: Insufficient documentation

## 2022-08-11 LAB — COMPREHENSIVE METABOLIC PANEL
ALT: 28 U/L (ref 0–44)
AST: 20 U/L (ref 15–41)
Albumin: 3.6 g/dL (ref 3.5–5.0)
Alkaline Phosphatase: 56 U/L (ref 38–126)
Anion gap: 13 (ref 5–15)
BUN: 18 mg/dL (ref 6–20)
CO2: 24 mmol/L (ref 22–32)
Calcium: 9.8 mg/dL (ref 8.9–10.3)
Chloride: 102 mmol/L (ref 98–111)
Creatinine, Ser: 0.96 mg/dL (ref 0.44–1.00)
GFR, Estimated: >60 mL/min (ref >60)
Glucose, Bld: 157 mg/dL — ABNORMAL HIGH (ref 70–99)
Potassium: 3.5 mmol/L (ref 3.5–5.1)
Sodium: 139 mmol/L (ref 135–145)
Total Bilirubin: 0.3 mg/dL (ref 0.3–1.2)
Total Protein: 6.4 g/dL — ABNORMAL LOW (ref 6.5–8.1)

## 2022-08-11 LAB — CBC
HCT: 36.7 % (ref 36.0–46.0)
Hemoglobin: 12.1 g/dL (ref 12.0–15.0)
MCH: 31.3 pg (ref 26.0–34.0)
MCHC: 33 g/dL (ref 30.0–36.0)
MCV: 94.8 fL (ref 80.0–100.0)
Platelets: 257 10*3/uL (ref 150–400)
RBC: 3.87 MIL/uL (ref 3.87–5.11)
RDW: 12.3 % (ref 11.5–15.5)
WBC: 7.3 10*3/uL (ref 4.0–10.5)
nRBC: 0 % (ref 0.0–0.2)

## 2022-08-11 LAB — SEDIMENTATION RATE: Sed Rate: 21 mm/hr (ref 0–22)

## 2022-08-11 LAB — C-REACTIVE PROTEIN: CRP: 0.6 mg/dL (ref <1.0)

## 2022-08-11 LAB — I-STAT BETA HCG BLOOD, ED (MC, WL, AP ONLY): I-stat hCG, quantitative: <5 m[IU]/mL (ref <5)

## 2022-08-11 MED ORDER — ONDANSETRON 4 MG PO TBDP: 4.0000 mg | ORAL_TABLET | Freq: Once | ORAL | Status: DC | PRN

## 2022-08-11 MED ORDER — DEXAMETHASONE SODIUM PHOSPHATE 10 MG/ML IJ SOLN
10.0000 mg | Freq: Once | INTRAMUSCULAR | Status: AC
  Administered 2022-08-11: 10 mg via INTRAVENOUS
  Filled 2022-08-11: qty 1

## 2022-08-11 MED ORDER — SODIUM CHLORIDE 0.9 % IV BOLUS (SEPSIS)
500.0000 mL | Freq: Once | INTRAVENOUS | Status: AC
  Administered 2022-08-11: 500 mL via INTRAVENOUS

## 2022-08-11 MED ORDER — PROCHLORPERAZINE EDISYLATE 10 MG/2ML IJ SOLN
10.0000 mg | Freq: Once | INTRAMUSCULAR | Status: AC
  Administered 2022-08-11: 10 mg via INTRAVENOUS
  Filled 2022-08-11: qty 2

## 2022-08-11 MED ORDER — DIPHENHYDRAMINE HCL 50 MG/ML IJ SOLN
25.0000 mg | Freq: Once | INTRAMUSCULAR | Status: AC
  Administered 2022-08-11: 25 mg via INTRAVENOUS
  Filled 2022-08-11: qty 1

## 2022-08-11 MED ORDER — IOHEXOL 350 MG/ML SOLN
75.0000 mL | Freq: Once | INTRAVENOUS | Status: AC | PRN
  Administered 2022-08-11: 75 mL via INTRAVENOUS

## 2022-08-11 NOTE — ED Notes (Signed)
Pt evaluated and imaging ordered by PA Will. Pt cleared for lobby

## 2022-08-11 NOTE — ED Notes (Signed)
Pt on her phone in no apparent distress. Continues to await MSE screening.

## 2022-08-11 NOTE — Discharge Instructions (Addendum)
You have been seen in the Emergency Department (ED) for a headache.  Your workup including physical exam, labs, imaging were reassuring.  Please use Tylenol or Motrin as needed for symptoms as well as your home migraine medications, but only as written on the box. You also need to drink water and reduce stress.  As we have discussed, please follow up with your primary care doctor and neurologist as soon as possible, ideally within one week, regarding today's ED visit and your headache symptoms.    Call your doctor or return to the ED if you have a worsening headache, sudden and severe headache, confusion, slurred speech, facial droop, fainting spells, weakness or numbness in any arm or leg, extreme fatigue, or other symptoms that concern you.

## 2022-08-11 NOTE — ED Triage Notes (Addendum)
Pt c/o headache x 2 days. Pain across her forehead and associated with blurry vision, light sensitivity, and nausea. Pain unchanged since ot started, worsening in severity today. Prescribed muscle relaxer with relief earlier this week. HX aneurysm in Jan requiring surgery at [facility].

## 2022-08-11 NOTE — ED Provider Notes (Signed)
[place] EMERGENCY DEPARTMENT AT [facility] Provider Note   CSN: [id number] Arrival date & time: 08/11/22  0023     History  Chief Complaint  Patient presents with   Headache    Christine Gordon is a 42 y.o. female.  With PMH of HTN, migraines, right MCA aneurysm status post clipping, fibromuscular dysplasia presenting with right-sided headache for the past 2 days.  Patient has had history of longstanding migraines managed on Ubrelvy.  Followed by neurology.  She is complaining of right-sided headache and deep pain behind right eye.  She feels like it shoots across her right face and is worse with chewing and bright lights.  Endorses " blurred" vision of the right eye however no visual field losses or diplopia.  She has chronic weakness of the left side.  No new weakness numbness or tingling.  She has had hot flashes and felt warm but no documented fevers.  No vomiting, no rashes, no other infectious symptoms.   Headache      Home Medications Prior to Admission medications   Medication Sig Start Date End Date Taking? Authorizing Provider  acetaminophen (TYLENOL 8 HOUR) 650 MG CR tablet Take 1 tablet (650 mg total) by mouth every 6 (six) hours as needed for pain or fever. 11/21/21   Sabino Dick, DO  Alpha-D-Galactosidase (GAS-X PREVENTION) CAPS Take 80 capsules by mouth as directed. 11/21/21   Imogene Burn, MD  aspirin EC 81 MG tablet Take 81 mg by mouth daily. Swallow whole.    [provider]  benzonatate (TESSALON PERLES) 100 MG capsule Take 1 capsule (100 mg total) by mouth 3 (three) times daily as needed for cough. Patient not taking: Reported on 04/15/2022 11/21/21   Sabino Dick, DO  Blood Pressure Monitoring (BLOOD PRESSURE CUFF) MISC Use to check blood pressure as directed by your doctor. 07/09/22   Fayette Pho, MD  butalbital-acetaminophen-caffeine [facility]) [phone] MG tablet Take 1-2 tablets by mouth every 6 (six) hours as needed for  headache. 04/17/22 04/17/23  Fayrene Helper, PA-C  Capsaicin-Menthol-Methyl Sal 0.001-10-20 % LOTN Apply to knee daily for pain relief. Patient not taking: Reported on 04/15/2022 11/20/21   Fayette Pho, MD  [employer] SALINE NASAL SPRAY 0.65 % nasal spray SPRAY 1 SPRAY INTO EACH NOSTRIL AS NEEDED FOR CONGESTION 12/13/21   Fayette Pho, MD  cyclobenzaprine (FLEXERIL) 5 MG tablet Take 1 tablet (5 mg total) by mouth 3 (three) times daily as needed. 11/20/21   Fayette Pho, MD  diclofenac Sodium (VOLTAREN) 1 % GEL Apply 2 g topically 4 (four) times daily. 07/10/22   Fayette Pho, MD  docusate sodium (COLACE) 250 MG capsule Take 1 capsule (250 mg total) by mouth daily. 11/20/21   Fayette Pho, MD  DULoxetine (CYMBALTA) 30 MG capsule Take 1 capsule (30 mg total) by mouth 2 (two) times daily. 07/19/22   Karsten Ro, MD  famotidine (PEPCID) 20 MG tablet Take 1 tablet (20 mg total) by mouth at bedtime. 11/20/21 11/15/22  Fayette Pho, MD  Galcanezumab-gnlm [facility]) 120 MG/ML SOAJ Inject 120 mg into the skin every 28 (twenty-eight) days. 11/23/21   Drema Dallas, DO  guaiFENesin (MUCINEX) 600 MG 12 hr tablet Take 1 tablet (600 mg total) by mouth 2 (two) times daily. Patient not taking: Reported on 04/15/2022 11/21/21   Sabino Dick, DO  ketoconazole (NIZORAL) 2 % cream APPLY 1 APPLICATION TOPICALLY DAILY 07/22/22   Edwin Cap, DPM  levETIRAcetam (KEPPRA) 1000 MG tablet Take by mouth. 03/30/22  [provider]  lidocaine (XYLOCAINE) 5 % ointment APPLY TOPICALLY 1 APPLICATION AS NEEDED 04/30/22   Fayette Pho, MD  LINZESS 290 MCG CAPS capsule TAKE 1 CAPSULE BY MOUTH DAILY BEFORE BREAKFAST. 07/17/22   Imogene Burn, MD  nystatin (MYCOSTATIN/NYSTOP) powder Apply 1 Application topically 2 (two) times daily. Apply to skin-on-skin areas such as breasts, belly, and inguinal creases 07/01/22   Fayette Pho, MD  olmesartan-hydrochlorothiazide [facility]) 20-12.5 MG tablet TAKE 1 TABLET BY  MOUTH EVERY DAY 02/19/22   Fayette Pho, MD  omeprazole (PRILOSEC) 40 MG capsule Take 1 capsule (40 mg total) by mouth daily. Take one capsule 30 minutes before breakfast daily. 11/21/21   Imogene Burn, MD  ondansetron (ZOFRAN-ODT) 4 MG disintegrating tablet Take 4 mg by mouth every 8 (eight) hours as needed. 03/30/22   [provider]  oxyCODONE (OXY IR/ROXICODONE) 5 MG immediate release tablet Take 5 mg by mouth every 6 (six) hours as needed. 04/05/22   [provider]  prochlorperazine (COMPAZINE) 10 MG tablet Take 1 tablet (10 mg total) by mouth 2 (two) times daily as needed (Headache). 04/30/22   Fayette Pho, MD  QUEtiapine (SEROQUEL) 100 MG tablet Take 1 tablet (100 mg total) by mouth at bedtime. 07/19/22   Karsten Ro, MD  rosuvastatin (CRESTOR) 20 MG tablet Take 1 tablet (20 mg total) by mouth daily. 07/02/22   Fayette Pho, MD  senna (SENOKOT) 8.6 MG TABS tablet Take 1 tablet (8.6 mg total) by mouth daily. 11/20/21   Fayette Pho, MD  Ubrogepant (UBRELVY) 100 MG TABS Take 1 tablet (100 mg total) by mouth as needed. May repeat after 2 hours.  Maximum 2 tablets in 24 hours. 04/15/22   Drema Dallas, DO  VENTOLIN HFA 108 (90 Base) MCG/ACT inhaler TAKE 2 PUFFS BY MOUTH EVERY 6 HOURS AS NEEDED 07/18/22   Fayette Pho, MD  verapamil (CALAN-SR) 120 MG CR tablet Take 1 tablet (120 mg total) by mouth at bedtime. 01/28/22   Sabino Dick, DO  Vitamin D, Ergocalciferol, (DRISDOL) 1.25 MG (50000 UNIT) CAPS capsule Take 1 capsule (50,000 Units total) by mouth every 7 (seven) days. 05/30/22   Fayette Pho, MD  zonisamide (ZONEGRAN) 100 MG capsule Take 1 capsule (100 mg total) by mouth daily. 06/15/21   Drema Dallas, DO      Allergies    Nsaids and Triptans    Review of Systems   Review of Systems  Neurological:  Positive for headaches.    Physical Exam Updated Vital Signs BP 135/81   Pulse 79   Temp 97.7 F (36.5 C) (Oral)   Resp 16   Ht 5' (1.524 m)   Wt  88.9 kg   LMP  (LMP Unknown)   SpO2 100%   BMI 38.28 kg/m  Physical Exam Constitutional: Alert and oriented. Slightly uncomfortable but nontoxic. Eyes: Conjunctivae are normal. PERRL. EOMI. Pain with medial lateral eye movement right eye. ENT      Head: Normocephalic and atraumatic. Tenderness of right temporal region with mild associated nonfluctuant bulge.  No obvious temporal artery deformity or visualized temporal artery bulge.      Neck: No meningismus Cardiovascular: S1, S2, RRR Respiratory: Normal respiratory effort. Breath sounds are normal. Gastrointestinal: Soft Musculoskeletal: No severe edema Neurologic: Normal speech and language. EOMI. PERRL. No facial droop. Tongue midline, 5 out of 5 strength right upper and right lower extremity.  Mild drift in left upper extremity. Mild drift LLE.  Skin: Skin is warm, dry  and intact. No rash noted. Psychiatric: Mood and affect are normal. Speech and behavior are normal.  ED Results / Procedures / Treatments   Labs (all labs ordered are listed, but only abnormal results are displayed) Labs Reviewed  COMPREHENSIVE METABOLIC PANEL - Abnormal; Notable for the following components:      Result Value   Glucose, Bld 157 (*)    Total Protein 6.4 (*)    All other components within normal limits  CBC  C-REACTIVE PROTEIN  SEDIMENTATION RATE  I-STAT BETA HCG BLOOD, ED ([facility], [facility], [facility] ONLY)    EKG None  Radiology CT ANGIO HEAD NECK W WO CM  Result Date: 08/11/2022 CLINICAL DATA:  Initial evaluation for neuro deficit, stroke. EXAM: CT ANGIOGRAPHY HEAD AND NECK WITH AND WITHOUT CONTRAST TECHNIQUE: Multidetector CT imaging of the head and neck was performed using the standard protocol during bolus administration of intravenous contrast. Multiplanar CT image reconstructions and MIPs were obtained to evaluate the vascular anatomy. Carotid stenosis measurements (when applicable) are obtained utilizing NASCET criteria, using the distal internal  carotid diameter as the denominator. RADIATION DOSE REDUCTION: This exam was performed according to the departmental dose-optimization program which includes automated exposure control, adjustment of the mA and/or kV according to patient size and/or use of iterative reconstruction technique. CONTRAST:  75mL OMNIPAQUE IOHEXOL 350 MG/ML SOLN COMPARISON:  Prior study from 04/17/2022 FINDINGS: CT HEAD FINDINGS Brain: Postoperative changes from prior right pterional craniotomy for surgical clipping of aneurysm at the right MCA bifurcation. No acute intracranial hemorrhage. No acute large vessel territory infarct. No mass lesion or midline shift. No hydrocephalus or extra-axial fluid collection. Vascular: No abnormal hyperdense vessel. Skull: No acute finding.  Prior right pterional craniotomy. Sinuses/Orbits: Globes orbital soft tissues within normal limits. Paranasal sinuses mastoid air cells are clear. Other: None. Review of the MIP images confirms the above findings CTA NECK FINDINGS Aortic arch: Visualized aortic arch normal in caliber with standard branch pattern. No stenosis about the origin the great vessels. Right carotid system: Right common and internal carotid arteries are patent without dissection or significant stenosis. Beading of the cervical right ICA consistent with FMD. Left carotid system: Left common and internal carotid arteries are patent without dissection or significant stenosis. 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 Vertebral arteries: Both vertebral arteries arise from the subclavian arteries. No proximal subclavian artery stenosis. Both vertebral arteries widely patent without stenosis, dissection or occlusion. Skeleton: No discrete or worrisome osseous lesions. Mild to moderate cervical spondylosis. Other neck: No other acute abnormality. Upper chest: No other acute abnormality within the visualized upper chest. Review of the MIP images confirms the above findings CTA HEAD  FINDINGS Anterior circulation: Both internal carotid arteries are patent to the termini without stenosis or other abnormality. A1 segments, anterior communicating complex common anterior cerebral arteries are widely patent. No M1 stenosis or occlusion. Surgical clipping of prior right MCA bifurcation aneurysm. No visible residual or recurrent aneurysm by CT. No proximal MCA branch occlusion or high-grade stenosis. Distal tip branches perfused and symmetric. Posterior circulation: Both V4 segments patent without stenosis. Both PICA patent. Basilar widely patent without stenosis. Superior cerebellar and posterior cerebral arteries patent bilaterally. Venous sinuses: Patent allowing for timing the contrast bolus. Anatomic variants: None significant. Review of the MIP images confirms the above findings IMPRESSION: 1. Negative CTA for large vessel occlusion or other emergent finding. 2. Surgical clipping of prior right MCA bifurcation aneurysm. No visible residual or recurrent aneurysm by CT. 3. Beading  of the cervical ICAs bilaterally, consistent with FMD. 4. No other acute intracranial abnormality. Electronically Signed   By: Rise Mu M.D.   On: 08/11/2022 03:37    Procedures Procedures    Medications Ordered in ED Medications  ondansetron (ZOFRAN-ODT) disintegrating tablet 4 mg (has no administration in time range)  iohexol (OMNIPAQUE) 350 MG/ML injection 75 mL (75 mLs Intravenous Contrast Given 08/11/22 0205)  prochlorperazine (COMPAZINE) injection 10 mg (10 mg Intravenous Given 08/11/22 0849)  diphenhydrAMINE (BENADRYL) injection 25 mg (25 mg Intravenous Given 08/11/22 0848)  dexamethasone (DECADRON) injection 10 mg (10 mg Intravenous Given 08/11/22 0849)  sodium chloride 0.9 % bolus 500 mL (0 mLs Intravenous Stopped 08/11/22 1009)    ED Course/ Medical Decision Making/ A&P Clinical Course as of 08/11/22 1019  Sun Aug 11, 2022  1010 Patient endorses some improvement of her symptoms and  requesting to be discharged home.  She says she cannot wait any longer.  Her ESR is still pending but her CRP was unremarkable at 0.6.  Based on her age and undetected CRP and neurologic exam and symptoms that seem consistent with prior exams and presentations in the past on chart review, do suspect still consistent with previous migraines and less consistent with temporal arteritis.  I did suggest very close follow-up with her neurologist as I did offer neurology consult here but she does not want to wait.  She will continue her home medications.  Strict return precaution discussed. [VB]    Clinical Course User Index [VB] Mardene Sayer, MD                             Medical Decision Making  Christine Gordon is a 42 y.o. female.  With PMH of HTN, migraines, right MCA aneurysm status post clipping, fibromuscular dysplasia presenting with right-sided headache for the past 2 days.   Regarding patient's headache, patient is nontoxic in appearance with no infectious symptoms, no meningismus, afebrile, I am not clinically concern for meningitis.  She has had previous aneurysm but no vomiting nontoxic appearance no meningismus and CTA head and neck obtained with no evidence of recurrent aneurysm or bleed personally reviewed by me.  She has chronic left-sided weakness and has a similar neurologic exam compared to her previous documented [facility] neurology exam in April 2024, no new deficits noted, not concerned for CVA.  Considered temporal arteritis as she had pain within the temporal region however there is no palpable temporal artery or deformity however there is some associated right-sided temporal region bulge and swelling.  On chart review, patient seen by [facility] neurosurgery in May 2024 for same and believed to be postop issues started on Celebrex.  This has been chronic and ongoing for patient.  After migraine cocktail here, patient endorses some improvement of her symptoms and requesting to be  discharged home.  She says she cannot wait any longer.  Her ESR is unremarkable 21 and her CRP was unremarkable at 0.6.  Based on her age and undetected CRP/low ESR and neurologic exam and symptoms that seem consistent with prior exams and presentations in the past on chart review, do suspect still consistent with previous migraines and not consistent with temporal arteritis.  I did suggest very close follow-up with her neurologist as I did offer neurology consult here but she does not want to wait.  She will continue her home medications.  Strict return precaution discussed. [VB]    Amount and/or  Complexity of Data Reviewed External Data Reviewed: notes.    Details: [facility] neuro note documenting physical exam April 2024: "Mental Status: Alert and oriented to self, person, time and place. Intact naming and repetition. Fluent speech. Cranial nerves: Able to correct back to seeing fully with the left eye. Full visual fields to confrontation. Intact extraocular movements. Tongue and palate are midline.Extraocular movements are intact. Smile is symmetric. The right face seems slightly more swollen than the left. Motor examination: Mild drift in the left upper extremity without pronation. Pain at the level of the shoulder. Some weakness, limited by pain at the deltoid, otherwise intact. Intact grip. Intact strength in the legs. Sensory examination: Decreased sensation to sharp touch on the left face, V1, V2 and V3 as compared to the right. Coordination: No ataxia noted. DTR: 1+ in both upper extremities. Unable to elicit at the knees. Gait: Steady casual walking, toe walking, and heel walking. Reflexes of the knees are 1 + and upper extremities are 2 +. " Labs: ordered.  Risk Prescription drug management.     Final Clinical Impression(s) / ED Diagnoses Final diagnoses:  Bad headache    Rx / DC Orders ED Discharge Orders     None         Mardene Sayer, MD 08/11/22 1019

## 2022-08-19 ENCOUNTER — Ambulatory Visit: Payer: Medicaid Other | Admitting: Family Medicine

## 2022-08-22 ENCOUNTER — Ambulatory Visit (INDEPENDENT_AMBULATORY_CARE_PROVIDER_SITE_OTHER): Payer: Medicaid Other | Admitting: Clinical

## 2022-08-22 ENCOUNTER — Other Ambulatory Visit: Payer: Self-pay

## 2022-08-22 DIAGNOSIS — F333 Major depressive disorder, recurrent, severe with psychotic symptoms: Secondary | ICD-10-CM | POA: Diagnosis not present

## 2022-08-22 NOTE — Progress Notes (Signed)
Patient attempted to be outreached by Minette Brine, PharmD Candidate on 08/22/22 to discuss hypertension. Left voicemail for patient to return our call at their convenience at [phone].  Minette Brine, Student-PharmD

## 2022-08-23 ENCOUNTER — Ambulatory Visit (INDEPENDENT_AMBULATORY_CARE_PROVIDER_SITE_OTHER): Payer: Medicaid Other | Admitting: Family Medicine

## 2022-08-23 ENCOUNTER — Encounter: Payer: Self-pay | Admitting: Family Medicine

## 2022-08-23 VITALS — BP 122/81 | HR 87 | Ht 60.0 in | Wt 195.0 lb

## 2022-08-23 DIAGNOSIS — G8929 Other chronic pain: Secondary | ICD-10-CM

## 2022-08-23 DIAGNOSIS — G43001 Migraine without aura, not intractable, with status migrainosus: Secondary | ICD-10-CM | POA: Diagnosis present

## 2022-08-23 DIAGNOSIS — M25561 Pain in right knee: Secondary | ICD-10-CM | POA: Diagnosis not present

## 2022-08-23 DIAGNOSIS — I10 Essential (primary) hypertension: Secondary | ICD-10-CM | POA: Diagnosis not present

## 2022-08-23 DIAGNOSIS — M7918 Myalgia, other site: Secondary | ICD-10-CM

## 2022-08-23 DIAGNOSIS — M1990 Unspecified osteoarthritis, unspecified site: Secondary | ICD-10-CM | POA: Diagnosis not present

## 2022-08-23 DIAGNOSIS — E782 Mixed hyperlipidemia: Secondary | ICD-10-CM | POA: Diagnosis not present

## 2022-08-23 MED ORDER — OLMESARTAN MEDOXOMIL-HCTZ 20-12.5 MG PO TABS: 1.0000 | ORAL_TABLET | Freq: Every day | ORAL | 3 refills | Status: AC

## 2022-08-23 MED ORDER — ROSUVASTATIN CALCIUM 20 MG PO TABS
20.0000 mg | ORAL_TABLET | Freq: Every day | ORAL | 3 refills | Status: AC
Start: 2022-08-23 — End: ?

## 2022-08-23 MED ORDER — ASPIRIN 81 MG PO TBEC: 81.0000 mg | DELAYED_RELEASE_TABLET | Freq: Every day | ORAL | 3 refills | Status: AC

## 2022-08-23 MED ORDER — CYCLOBENZAPRINE HCL 5 MG PO TABS: 5.0000 mg | ORAL_TABLET | Freq: Three times a day (TID) | ORAL | 3 refills | Status: AC | PRN

## 2022-08-23 MED ORDER — ONDANSETRON 4 MG PO TBDP: 4.0000 mg | ORAL_TABLET | Freq: Three times a day (TID) | ORAL | 3 refills | Status: AC | PRN

## 2022-08-23 MED ORDER — VERAPAMIL HCL ER 120 MG PO TBCR
120.0000 mg | EXTENDED_RELEASE_TABLET | Freq: Every day | ORAL | 3 refills | Status: AC
Start: 2022-08-23 — End: ?

## 2022-08-23 MED ORDER — DICLOFENAC SODIUM 1 % EX GEL
2.0000 g | Freq: Four times a day (QID) | CUTANEOUS | 2 refills | Status: AC
Start: 2022-08-23 — End: ?

## 2022-08-23 MED ORDER — CAPSAICIN-MENTHOL-METHYL SAL 0.001-10-20 % EX LOTN
TOPICAL_LOTION | CUTANEOUS | 3 refills | Status: AC
Start: 2022-08-23 — End: ?
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
## 2022-08-23 NOTE — Progress Notes (Unsigned)
SUBJECTIVE:   CHIEF COMPLAINT / HPI:   ED follow up Patient presents for follow up from ED on 6/09, where she presented for prolonged severe headache. Given her history of brain aneurysm s/p surgical clipping recently, she wanted to make sure everything was okay. She reports the pain was terrible and not responding to her normal medications. No further headaches since.   Currently both triptans and NSAIDs are on her allergy/contraindication list due to her brain aneurysm and fibromuscular dysplasia (per neurology and neurosurgery notes). However patient today reports that her brain surgeon has since cleared her to begin using NSAIDs again.   Medication refills Patient is moving to [place] next week and is requesting refills of her medications.  She has a PCP set up but will have a new patient appointment in a couple months  PERTINENT  PMH / PSH:  Patient Active Problem List   Diagnosis Date Noted   Intertrigo 07/01/2022   Stroke (HCC) 02/06/2022   Partially empty sella (HCC) 01/11/2022   MDD (major depressive disorder), recurrent, severe, with psychosis (HCC) 07/18/2021   Acute bilateral low back pain without sciatica 06/14/2021   Depressed mood 05/22/2021   Cervical spondylosis 04/04/2021   Chronic myofascial pain 04/04/2021   Chronic complaint of left-sided weakness 01/15/2021   Smoking    Hypertension 12/22/2020   Anxiety about dying 12/22/2020   Class 2 obesity due to excess calories with body mass index (BMI) of 36.0 to 36.9 in adult    Obstructive sleep apnea 12/21/2020   Fibromuscular dysplasia (HCC) 09/14/2020   Mixed hyperlipidemia 07/21/2020   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 04/09/2019   History of prediabetes 04/09/2019   Migraine without aura and with status migrainosus, not intractable 04/09/2019   Mild intermittent asthma without complication 01/20/2014    OBJECTIVE:   BP 122/81   Pulse 87   Ht 5' (1.524 m)   Wt 195 lb (88.5 kg)   LMP  (LMP  Unknown)   SpO2 99%   BMI 38.08 kg/m     PHQ-9:     08/23/2022    9:00 AM 03/19/2022    3:52 PM 02/07/2022    9:37 AM  Depression screen PHQ 2/9  Decreased Interest 1    Down, Depressed, Hopeless 1    PHQ - 2 Score 2    Altered sleeping 1    Tired, decreased energy 1    Change in appetite 1    Feeling bad or failure about yourself  1    Trouble concentrating 1    Moving slowly or fidgety/restless 1    Suicidal thoughts 0    PHQ-9 Score 8    Difficult doing work/chores Very difficult       Information is confidential and restricted. Go to Review Flowsheets to unlock data.     GAD-7:     06/13/2021    4:07 PM  GAD 7 : Generalized Anxiety Score  Nervous, Anxious, on Edge 3  Control/stop worrying 3  Worry too much - different things 3  Trouble relaxing 1  Restless 1  Easily annoyed or irritable 1  Afraid - awful might happen 1  Total GAD 7 Score 13  Anxiety Difficulty Somewhat difficult    Physical Exam General: Awake, alert, oriented Cardiovascular: Regular rate and rhythm, S1 and S2 present, no murmurs auscultated Respiratory: Lung fields clear to auscultation bilaterally Extremities: No bilateral lower extremity edema, palpable pedal and pretibial pulses bilaterally Neuro: Cranial nerves II through X grossly  intact, able to move all extremities spontaneously, BUE strength 5/5 and equal bilaterally of grip, push, pull, and shoulders; BLE strength 5/5 and equal bilaterally of hips (although does have chronic complaint of left sided weakness and is sometimes weaker on left)  ASSESSMENT/PLAN:   Hypertension Stable, at goal. Will refill both medications. NO changes to regimen at this time.   Migraine without aura and with status migrainosus, not intractable Will refill zofran and flexeril. She sees her neurologist before she leaves for [facility] for refills of the migraine specfic medications.   Chronic myofascial pain Chronic, stable. Refill of voltaren gel and  muscle rub.   Mixed hyperlipidemia Refill of rosuvastatin today.      Fayette Pho, MD [facility]

## 2022-08-23 NOTE — Patient Instructions (Addendum)
It was wonderful to see you today. Thank you for allowing me to be a part of your care. Below is a short summary of what we discussed at your visit today:  Med refills I have sent in refills of the following medications.  Please pick up from your pharmacy. Aspirin Capsaicin cream for the knee Flexeril muscle spasm Voltaren gel Olmesartan-minoxidil-HCTZ for blood pressure Ondansetron or Zofran for nausea Rosuvastatin for cholesterol Verapamil for blood pressure  Best of luck in [place]! I hope you settle in quickly and find it lovely.   If you have any questions or concerns, please do not hesitate to contact us via phone or MyChart message.   Fayette Pho, MD

## 2022-08-25 NOTE — Assessment & Plan Note (Signed)
Stable, at goal. Will refill both medications. NO changes to regimen at this time.

## 2022-08-25 NOTE — Assessment & Plan Note (Signed)
Will refill zofran and flexeril. She sees her neurologist before she leaves for [facility] for refills of the migraine specfic medications.

## 2022-08-25 NOTE — Assessment & Plan Note (Signed)
Refill of rosuvastatin today.

## 2022-08-25 NOTE — Assessment & Plan Note (Addendum)
Chronic, stable. Refill of voltaren gel and muscle rub.

## 2022-08-25 NOTE — Progress Notes (Signed)
   THERAPIST PROGRESS NOTE Virtual Visit via Video Note  I connected with Christine Gordon on 08/22/2022 at  3:00 PM EDT by a video enabled telemedicine application and verified that I am speaking with the correct person using two identifiers.  Location: Patient: home Provider: office   I discussed the limitations of evaluation and management by telemedicine and the availability of in person appointments. The patient expressed understanding and agreed to proceed.   Follow Up Instructions: I discussed the assessment and treatment plan with the patient. The patient was provided an opportunity to ask questions and all were answered. The patient agreed with the plan and demonstrated an understanding of the instructions.   The patient was advised to call back or seek an in-person evaluation if the symptoms worsen or if the condition fails to improve as anticipated.   Session Time: 30 minutes  Participation Level: Active  Behavioral Response: CasualAlertIrritable  Type of Therapy: Individual Therapy  Treatment Goals addressed: client will participate in at least 80% of scheduled individual psychotherapy sessions   ProgressTowards Goals: Progressing  Interventions: CBT and Supportive  Summary:  Christine Gordon is a 42 y.o. female who presents for the scheduled appointment oriented times five, appropriately dressed and cooperative. Client denied hallucinations and delusions. Client denied hallucinations and delusions.  Client reported she has been irritated. Client reported he family were recently in town because her grandmother was sick and they came to see about her. Client reported while they were in town they did not come and/or call to check on her. Client reported she feels like she has one backwards from what he wanted to do which was move past worrying about them. Client reported the stress causes her to stress and her head to hurt. Client reported she will be moving to live with her  brother. Client reported her children are in town helping to pack her things. Client reported she thinks the change will be good for her.  Evidence of progress towards goal:  client reported 1 positive of being able to use positive activities to help improve her mood 5 days at least.  Suicidal/Homicidal: Nowithout intent/plan  Therapist Response:  Therapist began the appointment asking the client how she has been. Therapist used Cbt to engage using active listening and positive emotional support. Therapist used cbt to engage and ask the client about the source of her current negative mood. Therapist used cbt to engage and validate the clients feelings about family. Therapist used cbt to engage with the client to help her reframe her negative thoughts and emotions. Therapist used CBT ask the client to identify her progress with frequency of use with coping skills with continued practice in her daily activity.      Plan: Return again in 4 weeks.  Diagnosis: mdd, severe, with psychosis   Collaboration of Care: Patient refused AEB none requested by the client.  Patient/Guardian was advised Release of Information must be obtained prior to any record release in order to collaborate their care with an outside provider. Patient/Guardian was advised if they have not already done so to contact the registration department to sign all necessary forms in order for [place] to release information regarding their care.   Consent: Patient/Guardian gives verbal consent for treatment and assignment of benefits for services provided during this visit. Patient/Guardian expressed understanding and agreed to proceed.   Neena Rhymes Mavryk Pino, LCSW 08/22/2022

## 2022-08-27 ENCOUNTER — Ambulatory Visit (INDEPENDENT_AMBULATORY_CARE_PROVIDER_SITE_OTHER): Payer: Medicaid Other | Admitting: Neurology

## 2022-08-27 ENCOUNTER — Encounter: Payer: Self-pay | Admitting: Neurology

## 2022-08-27 VITALS — BP 124/74 | HR 94 | Ht 63.0 in | Wt 195.0 lb

## 2022-08-27 DIAGNOSIS — I671 Cerebral aneurysm, nonruptured: Secondary | ICD-10-CM | POA: Diagnosis not present

## 2022-08-27 DIAGNOSIS — G43001 Migraine without aura, not intractable, with status migrainosus: Secondary | ICD-10-CM | POA: Diagnosis not present

## 2022-08-27 DIAGNOSIS — G43409 Hemiplegic migraine, not intractable, without status migrainosus: Secondary | ICD-10-CM

## 2022-08-27 MED ORDER — ZONISAMIDE 100 MG PO CAPS: 100.0000 mg | ORAL_CAPSULE | Freq: Every day | ORAL | 5 refills | Status: AC

## 2022-08-27 NOTE — Patient Instructions (Addendum)
Zonisamide 100mg  daily Ubrelvy as needed Follow up with neurosurgery Follow up with me in 6 months (virtual visit)

## 2022-08-28 ENCOUNTER — Encounter: Payer: Self-pay | Admitting: Family Medicine

## 2022-09-12 ENCOUNTER — Ambulatory Visit (INDEPENDENT_AMBULATORY_CARE_PROVIDER_SITE_OTHER): Payer: MEDICAID | Admitting: Clinical

## 2022-09-12 DIAGNOSIS — F333 Major depressive disorder, recurrent, severe with psychotic symptoms: Secondary | ICD-10-CM | POA: Diagnosis not present

## 2022-09-12 NOTE — Progress Notes (Signed)
THERAPIST PROGRESS NOTE Virtual Visit via Video Note  I connected with Christine Gordon on 09/12/2022 at  4:00 PM EDT by a video enabled telemedicine application and verified that I am speaking with the correct person using two identifiers.  Location: Patient: home Provider: office   I discussed the limitations of evaluation and management by telemedicine and the availability of in person appointments. The patient expressed understanding and agreed to proceed.   Follow Up Instructions: I discussed the assessment and treatment plan with the patient. The patient was provided an opportunity to ask questions and all were answered. The patient agreed with the plan and demonstrated an understanding of the instructions.   The patient was advised to call back or seek an in-person evaluation if the symptoms worsen or if the condition fails to improve as anticipated.   Session Time: 60 minutes  Participation Level: Active  Behavioral Response: CasualAlertEuthymic and Irritable  Type of Therapy: Individual Therapy  Treatment Goals addressed: client will identify cognitive patterns and beliefs that support depression  ProgressTowards Goals: Progressing  Interventions: CBT and Supportive  Summary:  Christine Gordon is a 42 y.o. female who presents for the scheduled appointment oriented times five, appropriately dressed and friendly. Client denied hallucinations and delusions. Client reported on today she has been through a lot in the past few weeks. Client reported she has been processing through negative emotions related to family. Client reported her brother retracted his offer for her and her two boys to stay with him while she waits to move into a new apartment. Client reported she did not expect him to treat her poorly especially telling her the day before she was supposed to come. Client reported she helped raised his kids and allowed him to live with her at one point. Client reported her  cousin opened her home to her and her boys which she was grateful for. Client reported a scary incident when her son when he drank something and hallucinating. Client reported he was treated at the hospital and doing better. Client reported she reached out to her mother to ask for help and ask why she hasn't been there for her. Client reported she plans to keep distance between her and her family moving forward. Evidence of progress towards goal:  client reported using boundaries 7 days per week.   Suicidal/Homicidal: Nowithout intent/plan  Therapist Response:  Therapist began the appointment asking the client how she has been doing. Therapist used CBT to engage using active listening and positive emotional support. Therapist used CBT to engage and ask about stressors contributing to her mood. Therapist used CBT to encourage appropriate communication and boundaries. Therapist used CBT ask the client to identify her progress with frequency of use with coping skills with continued practice in her daily activity.    Therapist assigned the client homework to practice self care.    Plan: Return again in 3 weeks.  Diagnosis: major depressive disorder, recurrent episode, severe with psychosis  Collaboration of Care: Patient refused AEB none requested by the client.  Patient/Guardian was advised Release of Information must be obtained prior to any record release in order to collaborate their care with an outside provider. Patient/Guardian was advised if they have not already done so to contact the registration department to sign all necessary forms in order for [place] to release information regarding their care.   Consent: Patient/Guardian gives verbal consent for treatment and assignment of benefits for services provided during this visit. Patient/Guardian expressed understanding and agreed  to proceed.   Neena Rhymes Coleton Woon, LCSW 09/12/2022

## 2022-10-09 ENCOUNTER — Ambulatory Visit (HOSPITAL_COMMUNITY): Payer: Medicaid Other | Admitting: Clinical

## 2022-10-09 DIAGNOSIS — F333 Major depressive disorder, recurrent, severe with psychotic symptoms: Secondary | ICD-10-CM

## 2022-10-09 NOTE — Progress Notes (Signed)
   THERAPIST PROGRESS NOTE Virtual Visit via Video Note  I connected with Meghen Curtiss on 10/09/22 at  2:00 PM EDT by a video enabled telemedicine application and verified that I am speaking with the correct person using two identifiers.  Location: Patient: home Provider: office   I discussed the limitations of evaluation and management by telemedicine and the availability of in person appointments. The patient expressed understanding and agreed to proceed.   Follow Up Instructions: I discussed the assessment and treatment plan with the patient. The patient was provided an opportunity to ask questions and all were answered. The patient agreed with the plan and demonstrated an understanding of the instructions.   The patient was advised to call back or seek an in-person evaluation if the symptoms worsen or if the condition fails to improve as anticipated.   Session Time: 30 minutes  Participation Level: Active  Behavioral Response: CasualAlertIrritable  Type of Therapy: Individual Therapy  Treatment Goals addressed: client will identify cognitive patterns and belief that support depression 1x per session  ProgressTowards Goals: Progressing  Interventions: CBT and Supportive  Summary:  Afra Cragun is a 42 y.o. female who presents for the scheduled appointment oriented times five, appropriately dressed and friendly. Client denied hallucination and delusions. Client reported on today she has been feeling bad physically. Client reported over the weekend she went to the hospital due to severe headache. Client reported she is doing better now. Client reported otherwise she has moved to [place] and is waiting to get established with a mental health provider there. Client reported she has to wait for her new PCP to do the referral. Client reported she reached out to her mother and sister for help one last time. Client reported her sister lied to her about coming to help her out with some  things. Client reported she feels like her mother just does not like her. Client reported she is going to be serious about having boundaries towards her family although she wishes it didn't have to be that way.  Evidence of progress towards goal:  client reported 1 coping skill she needs to use which is boundaries.   Suicidal/Homicidal: Nowithout intent/plan  Therapist Response:  Therapist began the appointment asking the client how she has been doing. Therapist used CBT to engage using active listening and positive emotional support. Therapist used CBT to engage and give the client time to elaborate on her stressor. Therapist used CBT to teach about boundaries and prioritizing herself. Therapist used CBT ask the client to identify her progress with frequency of use with coping skills with continued practice in her daily activity.    Therapist assigned the client homework to practice self care.   Plan: Return again in 4 weeks.  Diagnosis: major depressive disorder, recurrent episode, sever with psychosis  Collaboration of Care: Patient refused AEB none requested by the client.  Patient/Guardian was advised Release of Information must be obtained prior to any record release in order to collaborate their care with an outside provider. Patient/Guardian was advised if they have not already done so to contact the registration department to sign all necessary forms in order for [place] to release information regarding their care.   Consent: Patient/Guardian gives verbal consent for treatment and assignment of benefits for services provided during this visit. Patient/Guardian expressed understanding and agreed to proceed.   Neena Rhymes Jadakiss Barish, LCSW 10/09/2022

## 2022-10-15 ENCOUNTER — Encounter: Payer: Self-pay | Admitting: Neurology

## 2022-10-16 ENCOUNTER — Telehealth: Payer: Self-pay

## 2022-10-16 DIAGNOSIS — G43409 Hemiplegic migraine, not intractable, without status migrainosus: Secondary | ICD-10-CM

## 2022-10-16 DIAGNOSIS — G43001 Migraine without aura, not intractable, with status migrainosus: Secondary | ICD-10-CM

## 2022-10-16 DIAGNOSIS — I671 Cerebral aneurysm, nonruptured: Secondary | ICD-10-CM

## 2022-10-16 NOTE — Telephone Encounter (Signed)
Per Dr.Jaffe, Yes, we can send a referral with my notes.       Referral sent to  Specialty: Neurology  Address: [address] [place] [address] Phone:[phone] Fax: [phone]

## 2022-10-29 ENCOUNTER — Other Ambulatory Visit: Payer: Self-pay | Admitting: Podiatry

## 2022-10-30 ENCOUNTER — Ambulatory Visit (HOSPITAL_COMMUNITY): Payer: Medicaid Other | Admitting: Clinical

## 2022-10-30 DIAGNOSIS — F332 Major depressive disorder, recurrent severe without psychotic features: Secondary | ICD-10-CM | POA: Diagnosis not present

## 2022-10-30 DIAGNOSIS — F333 Major depressive disorder, recurrent, severe with psychotic symptoms: Secondary | ICD-10-CM

## 2022-10-30 NOTE — Progress Notes (Signed)
THERAPIST PROGRESS NOTE Virtual Visit via Video Note  I connected with Christine Gordon on 10/30/2022 at  3:00 PM EDT by a video enabled telemedicine application and verified that I am speaking with the correct person using two identifiers.  Location: Patient: home Provider: office   I discussed the limitations of evaluation and management by telemedicine and the availability of in person appointments. The patient expressed understanding and agreed to proceed.   Follow Up Instructions: I discussed the assessment and treatment plan with the patient. The patient was provided an opportunity to ask questions and all were answered. The patient agreed with the plan and demonstrated an understanding of the instructions.   The patient was advised to call back or seek an in-person evaluation if the symptoms worsen or if the condition fails to improve as anticipated.   Session Time: 45 minutes  Participation Level: Active  Behavioral Response: CasualAlertEuthymic  Type of Therapy: Individual Therapy  Treatment Goals addressed: Christine Gordon will identify cognitive patterns and beliefs that support depression 1x per session   ProgressTowards Goals: Progressing  Interventions: CBT and Supportive  Summary:  Christine Gordon is a 42 y.o. female who presents for the scheduled appointment oriented x 5, appropriately dressed, and friendly.  Client denied hallucinations and delusions. Client reported on today she is doing pretty well.  Client reported she did receive a notice in the mail stating that Social Security had denied her.  Client reported at this time she is unsure if she wants to follow-up with an appeal.  Client reported otherwise she is settling well into her new place.  Client reported she has not been interacting with any negative people from her family and things have been peaceful.  Client reported she has decided to see if she would be able to work.  Client reported she is looking to pick  up some part-time hours at [employer] to see if she is mentally and physically capable of keeping up with the work pace.  Client reported her mood has been very beneficial to her as she has reconnected with some old friends and it has helped to lift her spirits.  Client reported she is also reconnected with a female friend from childhood and it has been a nice way to occupy her time.  Client reported she sees these things as a positive visit is helping her to get up and not be as depressed.  Client reported she is also engaged in Bible study with a local church which has also helped to keep her grounded.  Client reported she has never been isolated for as long as she has over the past 7 months although it has been related to recovering from a brain surgery.  Client reported she goes into tomorrow to have a follow-up MRI to see if she is healing properly. Evidence of progress towards goal:     Suicidal/Homicidal: Nowithout intent/plan  Therapist Response:  Therapist began the appointment asking the client how she has been doing since last seen. Therapist used CBT to engage using active listening and positive emotional support towards her thoughts and feelings. Therapist used CBT to ask the client how she is adjusting to changes in her psychosocial stressors as well as interpersonal relationships. Therapist used CBT to ask the client to identify positive changes that have occurred and how it is helping to shift her mindset to cope with her depression. Therapist positively reinforced the clients engagement with positive activity. Therapist used CBT ask the client to identify her  progress with frequency of use with coping skills with continued practice in her daily activity.    Therapist assigned the client homework to continue engaging with positive behavioral activities.   Plan: Return again in 4 weeks.  Diagnosis: major depressive disorder, recurrent episode, severe without psychosis  Collaboration  of Care: Patient refused AEB none requested.  Patient/Guardian was advised Release of Information must be obtained prior to any record release in order to collaborate their care with an outside provider. Patient/Guardian was advised if they have not already done so to contact the registration department to sign all necessary forms in order for [place] to release information regarding their care.   Consent: Patient/Guardian gives verbal consent for treatment and assignment of benefits for services provided during this visit. Patient/Guardian expressed understanding and agreed to proceed.   Christine Rhymes Katheline Brendlinger, LCSW 10/30/2022

## 2022-11-28 ENCOUNTER — Other Ambulatory Visit: Payer: Self-pay | Admitting: Family Medicine

## 2022-12-20 ENCOUNTER — Ambulatory Visit (HOSPITAL_COMMUNITY): Payer: Medicaid Other | Admitting: Clinical

## 2022-12-20 DIAGNOSIS — F333 Major depressive disorder, recurrent, severe with psychotic symptoms: Secondary | ICD-10-CM | POA: Diagnosis not present

## 2022-12-20 NOTE — Progress Notes (Signed)
THERAPIST PROGRESS NOTE Virtual Visit via Video Note  I connected with Christine Gordon on 12/20/22 at  9:00 AM EDT by a video enabled telemedicine application and verified that I am speaking with the correct person using two identifiers.  Location: Patient: Home Provider: Office   I discussed the limitations of evaluation and management by telemedicine and the availability of in person appointments. The patient expressed understanding and agreed to proceed.   Follow Up Instructions:  I discussed the assessment and treatment plan with the patient. The patient was provided an opportunity to ask questions and all were answered. The patient agreed with the plan and demonstrated an understanding of the instructions.   The patient was advised to call back or seek an in-person evaluation if the symptoms worsen or if the condition fails to improve as anticipated.   Session Time: 40 minutes  Participation Level: Active  Behavioral Response: CasualAlertIrritable  Type of Therapy: Individual Therapy  Treatment Goals addressed: [name] will participate in at least 80% of scheduled individual psychotherapy sessions   ProgressTowards Goals: Met  Interventions: CBT and Supportive  Summary:  Christine Gordon is a 42 y.o. female who presents for the scheduled appointment oriented x 5, appropriately dressed, and friendly.  Client denied hallucinations and delusions. Client reported on today she has experienced some setbacks.  Client reported she has to get injections in her neck and Botox in her head to help with the headaches.  Client reported she tried to go back to her old job at [employer] to see if she would even be able to work again.  Client reported she was there for 3 days before she ended up back in the hospital again due to severe headaches from her brain operation.  Client reported she is not able to sustain being on her feet and working at this time.  Client reported her doctor has also  put her back in physical therapy due to her inability to use the left side of her body appropriately.  Client reported she is unable to keep her arm and leg moving as it should.  Client reported it feels like her body is working against her.  Client reported she had discontinued physical therapy previously because it causes so much pain.  Client reported she will be her best to stick with at this time.  Client reported she met with her primary care physician a few weeks ago and the doctor tried to have her involuntarily committed due to her discussion they were having about a screening of suicidal ideations.  Client reported what people do not understand is the amount of stress that she deals with regarding her family.  Client reported she has blocked her immediate family from having contact with her but it seems that they have reached out to extended family members to call and talk negatively about her.  Client reported their anger dropped her to the point of having thoughts of wanting to hurt one of them. Client reported she does not have the means or access to do so.  Client reported she has a hard time redirecting her thoughts from feeling angry.  Client reported otherwise she does have difficulty still with falling asleep.  Client reported it is not because she is not tired but due to her health and how her head has been swelling she is scared.  Client reported the doctor said they may have to do another procedure on the right side of her head if that muscle-tendon does not  reduce its size.  Client reported she is now connected with a behavioral health therapist and psychiatrist through [facility]. Evidence of progress towards goal: Client reported more and engagement in activity of listening to gospel music and/or reading daily devotionals which helped to redirect her mood.   Suicidal/Homicidal: Nowithout intent/plan  Therapist Response:  Therapist began the appointment asking the client how she has  been doing since last seen. Therapist used CBT to engage using active listening and positive emotional support. Therapist used CBT to ask the client to discuss stressors that are contributing to her negative mood. Therapist used CBT to engage with the client in identifying positive grounding skills to help alleviate anger. Therapist used CBT ask the client to identify her progress with frequency of use with coping skills with continued practice in her daily activity.    Therapist used CBT to discuss the clients transition of care now that [place]. Therapist reviewed that the client is scheduled with an outpatient therapist psychiatrist in her area. Therapist resolved the client's treatment plan.    Plan: Client will not return for services due to relocating outside of the county.  Diagnosis: Major depressive disorder, recurrent, severe with psychosis  Collaboration of Care: Patient refused AEB none requested by the client.  Patient/Guardian was advised Release of Information must be obtained prior to any record release in order to collaborate their care with an outside provider. Patient/Guardian was advised if they have not already done so to contact the registration department to sign all necessary forms in order for [place] to release information regarding their care.   Consent: Patient/Guardian gives verbal consent for treatment and assignment of benefits for services provided during this visit. Patient/Guardian expressed understanding and agreed to proceed.   Neena Rhymes Sky Primo, LCSW 12/20/2022

## 2023-03-03 ENCOUNTER — Encounter: Payer: Self-pay | Admitting: Neurology

## 2023-03-03 ENCOUNTER — Ambulatory Visit: Payer: MEDICAID | Admitting: Neurology

## 2023-03-03 NOTE — Telephone Encounter (Signed)
sent 

## 2023-06-21 ENCOUNTER — Other Ambulatory Visit: Payer: Self-pay | Admitting: Podiatry

## 2023-10-30 IMAGING — CT CT HEAD W/O CM
3 series · 15 of 47 positions shown, 18 images · non-contrast
Comparison: CTA done on 01/24/2021

CLINICAL DATA: Headaches



[Series 3: head 5.0 h30s · axial · 0.42mm/px · z∈[-133,-8]mm · 9 of 31 slices shown, 12 images]
[im 3/31  brain]
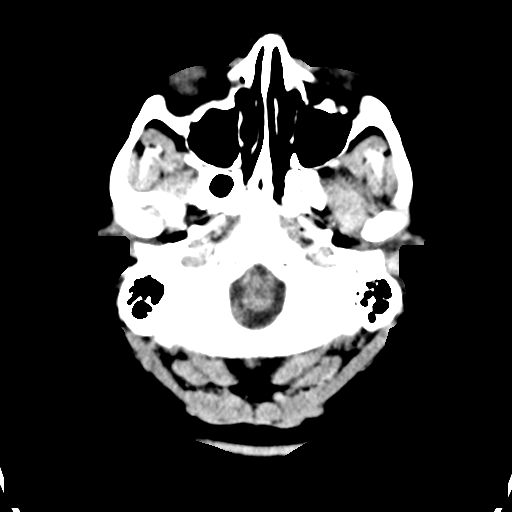
[im 3/31  bone]
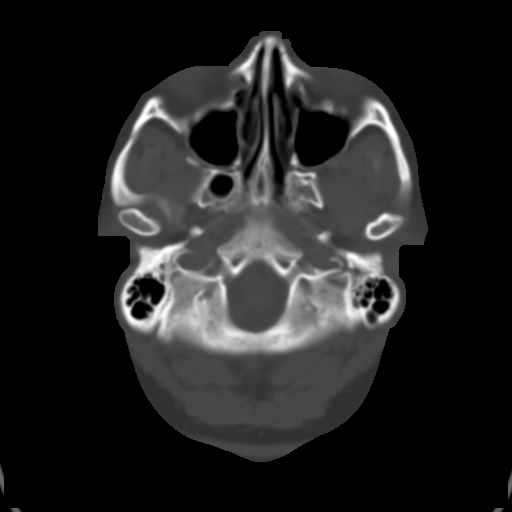
[im 6/31  brain]
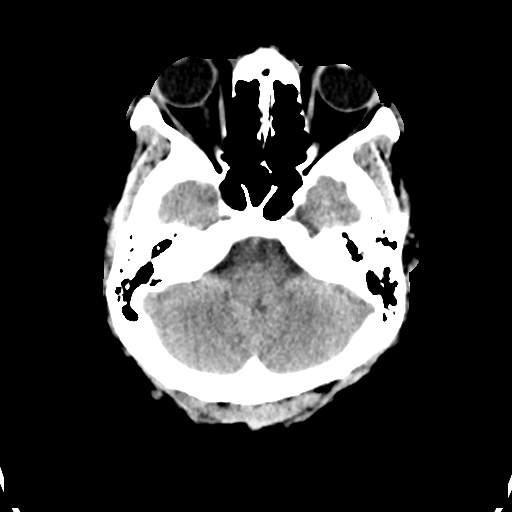
[im 9/31  brain]
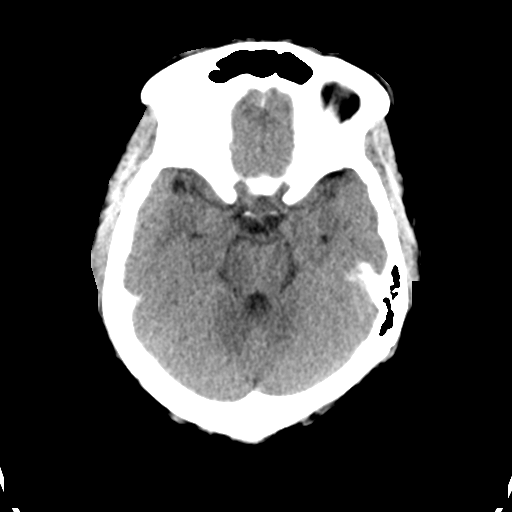
[im 12/31  brain]
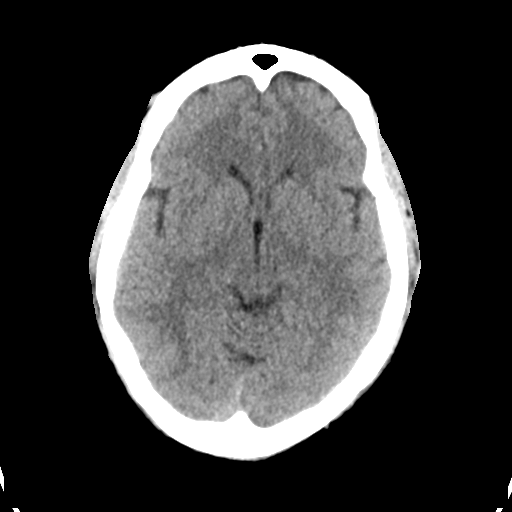
[im 16/31  brain]
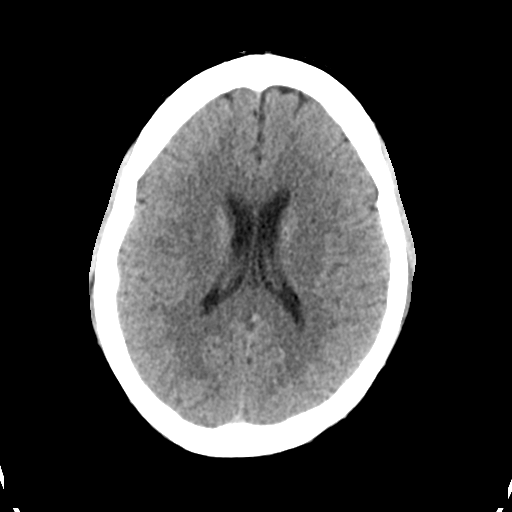
[im 16/31  bone]
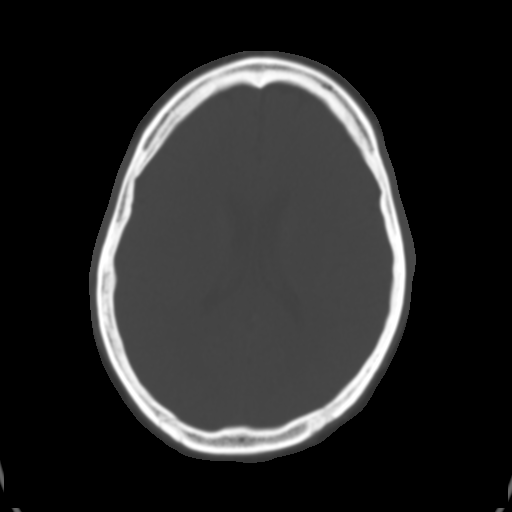
[im 19/31  brain]
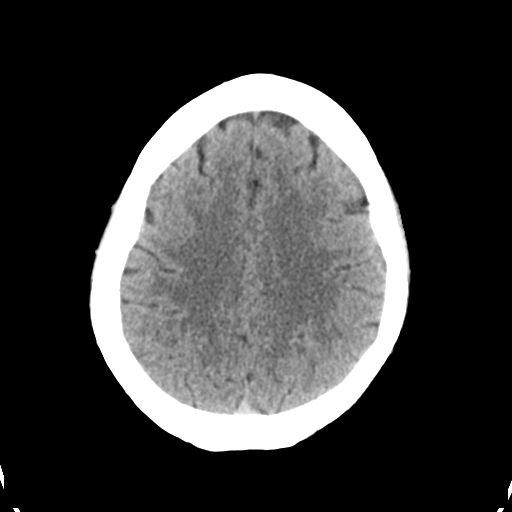
[im 22/31  brain]
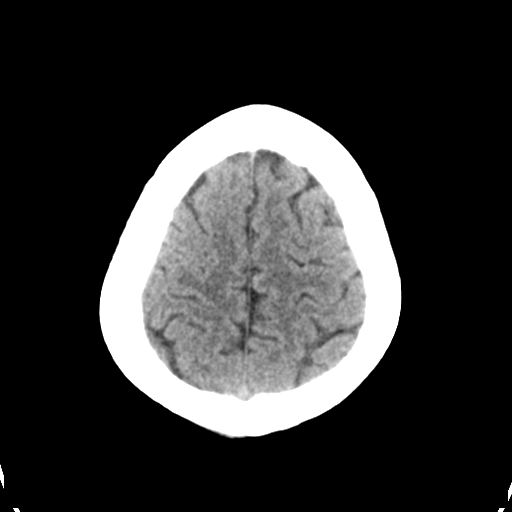
[im 25/31  brain]
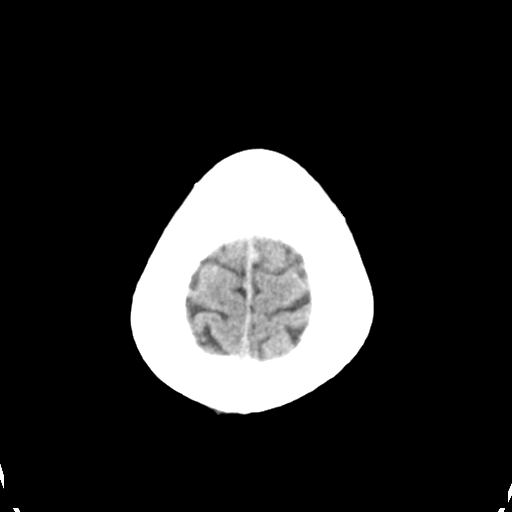
[im 28/31  brain]
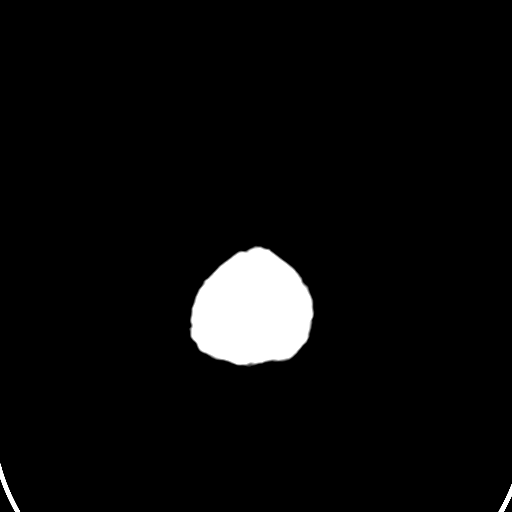
[im 28/31  bone]
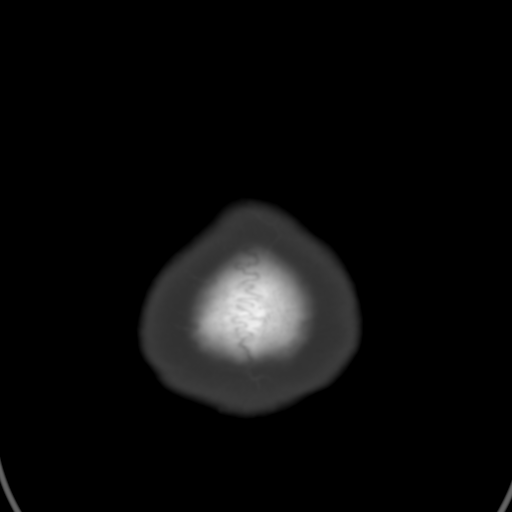

[Series 5: head 3.0 mpr cor · coronal · 0.31mm/px · 3 of 67 slices shown]
[im 23/67  brain]
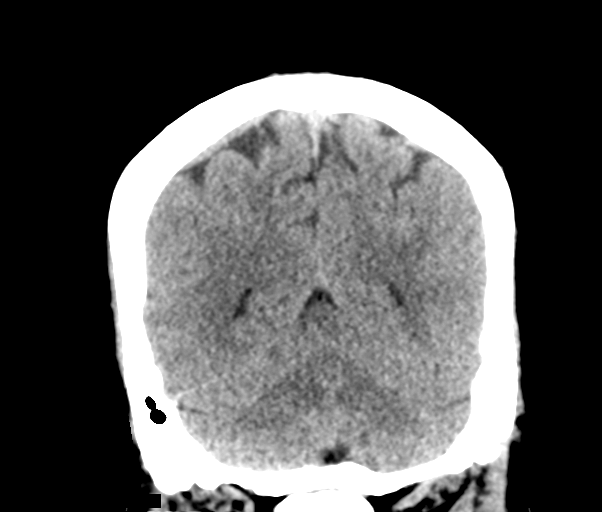
[im 30/67  brain]
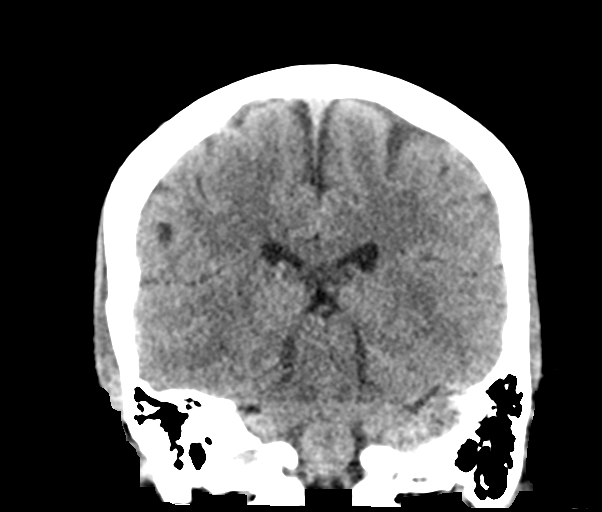
[im 37/67  brain]
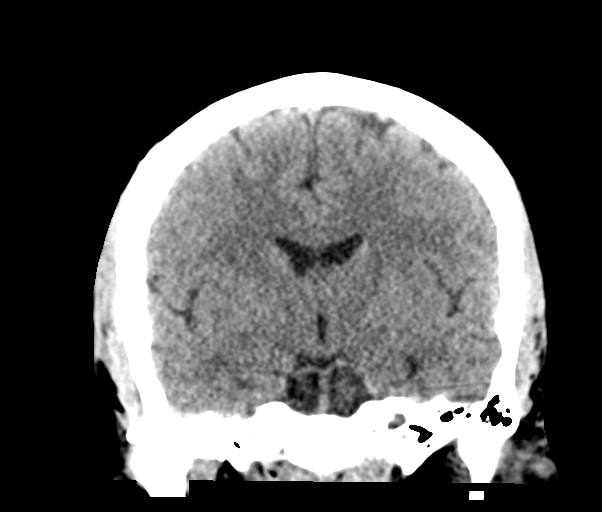

[Series 6: head 3.0 mpr sag · sagittal · 0.30mm/px · 3 of 63 slices shown]
[im 21/63  brain]
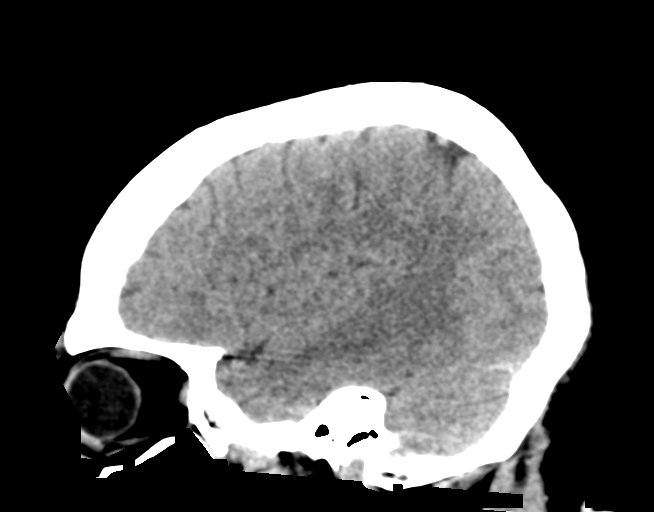
[im 32/63  brain]
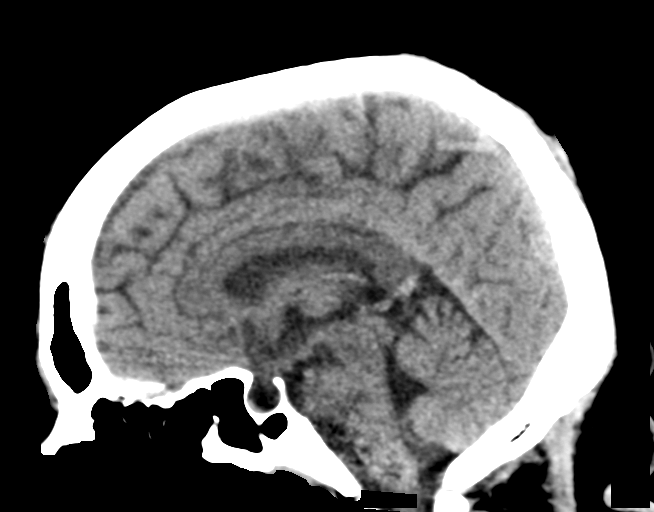
[im 42/63  brain]
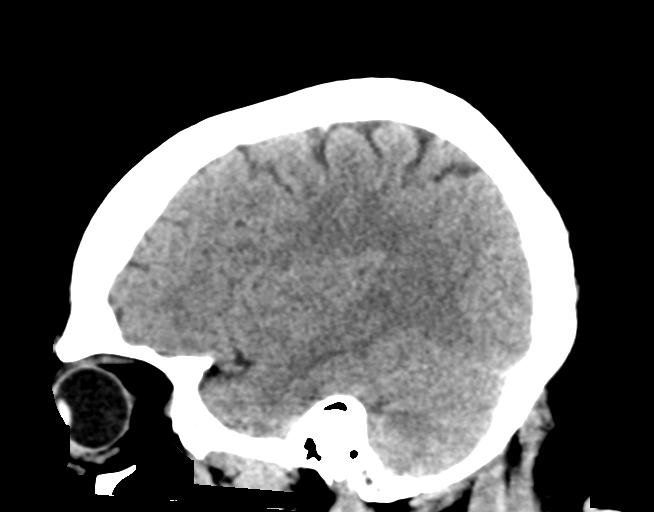

[15 of 47 positions shown; findings below may reference images not displayed]

FINDINGS: Brain: No acute intracranial findings are seen. There are no signs
of bleeding within the cranium. Ventricles are not dilated. There is
no focal edema or mass effect.

Vascular: Unremarkable.

Skull: Unremarkable.

Sinuses/Orbits: Unremarkable.

Other: There is increased amount of CSF insula suggesting partial
empty sella.
IMPRESSION: No acute intracranial findings are seen in noncontrast CT brain.
